# Patient Record
Sex: Female | Born: 1965 | Race: White | Hispanic: No | Marital: Married | Smoking: Never smoker
Health system: Southern US, Community
[De-identification: ages and names within clinical notes are randomized; demographics above are authoritative.]

## PROBLEM LIST (undated history)

## (undated) DIAGNOSIS — N907 Vulvar cyst: Secondary | ICD-10-CM

## (undated) DIAGNOSIS — D219 Benign neoplasm of connective and other soft tissue, unspecified: Secondary | ICD-10-CM

## (undated) DIAGNOSIS — M542 Cervicalgia: Secondary | ICD-10-CM

## (undated) DIAGNOSIS — Z87440 Personal history of urinary (tract) infections: Secondary | ICD-10-CM

## (undated) DIAGNOSIS — R519 Headache, unspecified: Secondary | ICD-10-CM

## (undated) DIAGNOSIS — N939 Abnormal uterine and vaginal bleeding, unspecified: Secondary | ICD-10-CM

## (undated) DIAGNOSIS — T4145XA Adverse effect of unspecified anesthetic, initial encounter: Secondary | ICD-10-CM

## (undated) DIAGNOSIS — E7800 Pure hypercholesterolemia, unspecified: Secondary | ICD-10-CM

## (undated) DIAGNOSIS — F419 Anxiety disorder, unspecified: Secondary | ICD-10-CM

## (undated) DIAGNOSIS — E8940 Asymptomatic postprocedural ovarian failure: Secondary | ICD-10-CM

## (undated) DIAGNOSIS — K219 Gastro-esophageal reflux disease without esophagitis: Secondary | ICD-10-CM

## (undated) DIAGNOSIS — G500 Trigeminal neuralgia: Secondary | ICD-10-CM

## (undated) DIAGNOSIS — R51 Headache: Secondary | ICD-10-CM

## (undated) DIAGNOSIS — IMO0002 Reserved for concepts with insufficient information to code with codable children: Secondary | ICD-10-CM

## (undated) DIAGNOSIS — M1990 Unspecified osteoarthritis, unspecified site: Secondary | ICD-10-CM

## (undated) DIAGNOSIS — T8859XA Other complications of anesthesia, initial encounter: Secondary | ICD-10-CM

## (undated) DIAGNOSIS — N816 Rectocele: Secondary | ICD-10-CM

## (undated) DIAGNOSIS — R202 Paresthesia of skin: Secondary | ICD-10-CM

## (undated) DIAGNOSIS — N814 Uterovaginal prolapse, unspecified: Secondary | ICD-10-CM

## (undated) DIAGNOSIS — K644 Residual hemorrhoidal skin tags: Secondary | ICD-10-CM

## (undated) DIAGNOSIS — I10 Essential (primary) hypertension: Secondary | ICD-10-CM

## (undated) DIAGNOSIS — R87619 Unspecified abnormal cytological findings in specimens from cervix uteri: Secondary | ICD-10-CM

## (undated) HISTORY — DX: Trigeminal neuralgia: G50.0

## (undated) HISTORY — DX: Asymptomatic postprocedural ovarian failure: E89.40

## (undated) HISTORY — PX: LAPAROSCOPY: SHX197

## (undated) HISTORY — PX: CHOLECYSTECTOMY: SHX55

## (undated) HISTORY — DX: Paresthesia of skin: R20.2

## (undated) HISTORY — DX: Vulvar cyst: N90.7

## (undated) HISTORY — DX: Benign neoplasm of connective and other soft tissue, unspecified: D21.9

## (undated) HISTORY — DX: Unspecified abnormal cytological findings in specimens from cervix uteri: R87.619

## (undated) HISTORY — DX: Rectocele: N81.6

## (undated) HISTORY — DX: Uterovaginal prolapse, unspecified: N81.4

## (undated) HISTORY — DX: Essential (primary) hypertension: I10

## (undated) HISTORY — DX: Headache: R51

## (undated) HISTORY — DX: Residual hemorrhoidal skin tags: K64.4

## (undated) HISTORY — DX: Headache, unspecified: R51.9

## (undated) HISTORY — DX: Reserved for concepts with insufficient information to code with codable children: IMO0002

## (undated) HISTORY — PX: BREAST ENHANCEMENT SURGERY: SHX7

## (undated) HISTORY — PX: VAGINAL HYSTERECTOMY: SUR661

## (undated) HISTORY — DX: Cervicalgia: M54.2

## (undated) HISTORY — DX: Abnormal uterine and vaginal bleeding, unspecified: N93.9

## (undated) HISTORY — DX: Pure hypercholesterolemia, unspecified: E78.00

## (undated) HISTORY — PX: OTHER SURGICAL HISTORY: SHX169

## (undated) HISTORY — DX: Personal history of urinary (tract) infections: Z87.440

---

## 2004-03-14 HISTORY — PX: AUGMENTATION MAMMAPLASTY: SUR837

## 2011-11-13 LAB — HM PAP SMEAR

## 2011-12-26 ENCOUNTER — Ambulatory Visit: Payer: Self-pay | Admitting: Internal Medicine

## 2012-11-12 LAB — HM MAMMOGRAPHY

## 2013-07-01 ENCOUNTER — Ambulatory Visit: Payer: Self-pay | Admitting: Neurology

## 2013-07-03 ENCOUNTER — Telehealth: Payer: Self-pay | Admitting: *Deleted

## 2013-07-03 NOTE — Telephone Encounter (Signed)
Pt returned call, she is seeking 2nd opinion on trigeminal neuralgia vs atypical facial pain.  Started 3 mon ago (mild to severe).  Been on gabapentin 100 po tid, then to generic tegretol, then changed to lamictal 200 mg po bid due to taking medication re: hysterectomy issues,  (taking with 3 advil too).  Dulls pain L side face.  Had MRI.  Has seen other neurologist.  Will get records and bring MRI CD with appt on 07-16-13 1300 with Dr. Leonie Man.  Referral made by client that is working with Dr. Collene Mares.  Will fax records to [phone] NP Coordinator.

## 2013-07-03 NOTE — Telephone Encounter (Signed)
Calling pt per Dr. Leonie Man as referral from Dr. Collene Mares.   LMVM for her to return call.  [phone]   07-16-13 1300  f/a

## 2013-07-11 NOTE — Telephone Encounter (Signed)
I spoke with pt and apologized re: I did not get her on schedule with Dr. Leonie Man although I placed in note.  Fortunately, Dr. Krista Blue had opening on same day at 0900.  Pt stated preferred woman MD, and this was ok.  She will be here.  CD and notes here from [facility].

## 2013-07-16 ENCOUNTER — Ambulatory Visit (INDEPENDENT_AMBULATORY_CARE_PROVIDER_SITE_OTHER): Payer: BC Managed Care – PPO | Admitting: Neurology

## 2013-07-16 ENCOUNTER — Encounter: Payer: Self-pay | Admitting: Neurology

## 2013-07-16 ENCOUNTER — Encounter (INDEPENDENT_AMBULATORY_CARE_PROVIDER_SITE_OTHER): Payer: Self-pay

## 2013-07-16 VITALS — BP 136/89 | HR 82 | Ht 64.0 in | Wt 176.0 lb

## 2013-07-16 DIAGNOSIS — R202 Paresthesia of skin: Secondary | ICD-10-CM | POA: Insufficient documentation

## 2013-07-16 DIAGNOSIS — M542 Cervicalgia: Secondary | ICD-10-CM

## 2013-07-16 DIAGNOSIS — R519 Headache, unspecified: Secondary | ICD-10-CM | POA: Insufficient documentation

## 2013-07-16 DIAGNOSIS — R51 Headache: Secondary | ICD-10-CM

## 2013-07-16 DIAGNOSIS — R209 Unspecified disturbances of skin sensation: Secondary | ICD-10-CM

## 2013-07-16 MED ORDER — GABAPENTIN 300 MG PO CAPS: ORAL_CAPSULE | ORAL | Status: DC

## 2013-07-16 NOTE — Progress Notes (Signed)
PATIENT: Paula Moses DOB: [birth year]  HISTORICAL  Amiliana Taher is a 48 years old right-handed Caucasian female, accompanied by her husband, referred by her primary care physician Dr. Carrie Mew in for evaluation of shooting pain on the left side of her face.  Pain started in Feb 2015, involving the left side of her teeth, top and botoom, radiating to her left cheek, chin, mild pain in her left  Ear, she was checked by dentist, was told that her teeth was fine fine,   She also noticed left neck pain, as if she has slept wrong,  She has tried chiropractor, massage, adjustment, did not help,   Since early April 2015, she had a few episode of severe shooting pain from her left ear, to her left cheek, is worse than the contraction pain she had for her child birth, lasting for 30 minutes, brought her to tears,  She has tried Excedrin, naproxen, Advil 2 times which only dulls the pain, but never took it away,  She was given prescription of gabapentin in June 21 2013, 100 mg 3 times a day, tramadol 100 mg as needed, along with Advil without taking away her pain, She was given prescription of Lamictal titrating dose to 273m bid since June 29 2013, which has finally helped her left facial pain into control, but almost nightly, she still has 7 out of 10 left facial pain, kept Her from sleep,  She also has a history of her large size fibroid, heavy bleeding, endometriosis, is considering for laparoscopic hysterectomy, carbamazepine had potential interaction with Lupron, was never tried.   [facility] 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  There was no rash broke out in her  face, she also complains of the discomfort in the roof of her mouth, also episode of numbness from waist down, there was also episode of shooting pain from her left neck, to her left shoulder, to her left arm, and left hand, she did have a history of chickenpox in the past,  She also complains of urinary urgency, to the point of incontinence, constipations,  Labs in April 14th 2015: CBC, H 12.7, CMP, TSH, ESR 47,   \ REVIEW OF SYSTEMS: Full 14 system review of systems performed and notable only for  Weight gain, fatigue, chest pressure, palpitation, eye pain, constipation, headache, numbness,  ALLERGIES: Allergies  Allergen Reactions  . Codeine   . Percocet [Oxycodone-Acetaminophen]   . Phenergan [Promethazine Hcl]     HOME MEDICATIONS: No current outpatient prescriptions on file prior to visit.   No current facility-administered medications on file prior to visit.    PAST MEDICAL HISTORY: Past Medical History  Diagnosis Date  . Hypertension   . High cholesterol   . Fibroids     PAST SURGICAL HISTORY: Past Surgical History  Procedure Laterality Date  . Breast enhancement surgery    . Tummy tuck     . Cholecystectomy    . Laparoscopy, endometriosis      FAMILY HISTORY: Family History  Problem Relation Age of Onset  . Brain cancer Mother   . Dementia Father     SOCIAL HISTORY:  History   Social History  .  Marital Status: Married    Spouse Name: N/A    Number of Children: 4  . Years of Education: BA   Occupational History  . realtor    Social History Main Topics  . Smoking status: Never Smoker   . Smokeless tobacco: Not on file  . Alcohol Use: Yes  . Drug Use: No  . Sexual Activity: Not on file   Other Topics Concern  . Not on file   Social History Narrative  . No narrative on file    PHYSICAL EXAM   Filed Vitals:   07/16/13 0919  BP: 136/89  Pulse: 82  Height: _0  (1.626 m)  Weight: 176 lb (79.833 kg)    Body mass index is 30.2  kg/(m^2).   Generalized: In no acute distress  Neck: Supple, no carotid bruits   Cardiac: Regular rate rhythm  Pulmonary: Clear to auscultation bilaterally  Musculoskeletal: No deformity  Neurological examination  Mentation: Alert oriented to time, place, history taking, and causual conversation  Cranial nerve II-XII: Pupils were equal round reactive to light. Extraocular movements were full.  Visual field were full on confrontational test. Bilateral fundi were sharp.  Facial sensation and strength were normal. Hearing was intact to finger rubbing bilaterally. Uvula tongue midline.  Head turning and shoulder shrug and were normal and symmetric.Tongue protrusion into cheek strength was normal.  Motor: Normal tone, bulk and strength.  Sensory: Intact to fine touch, pinprick, preserved vibratory sensation, and proprioception at toes.  Coordination: Normal finger to nose, heel-to-shin bilaterally there was no truncal ataxia  Gait: Rising up from seated position without assistance, normal stance, without trunk ataxia, moderate stride, good arm swing, smooth turning, able to perform tiptoe, and heel walking without difficulty.   Romberg signs: Negative  Deep tendon reflexes: Brachioradialis 2/2, biceps 2/2, triceps 2/2, patellar 3/3, Achilles 2/2, plantar responses were flexor bilaterally.   DIAGNOSTIC DATA (LABS, IMAGING, TESTING) - I reviewed patient records, labs, notes, testing and imaging myself where available.    ASSESSMENT AND PLAN  Brandee Markin is a 48 y.o. female with two-month history of gradual onset several worsening left facial shooting pain, in the distribution of left trigeminal nerve, mainly involving left V2, V3, she also complains of numbness from waist down, radiating pain from her left neck, to her left arm, shoulder, urinary urgency, mild constipations, brisker reflexes on examination  Localized the lesion to left brainstem, left cervical spine, thoracic  spine,  After discussed with patient and her husband, who desires further evaluation, we will proceed with MRI of cervical, thoracic, lumbar spine with and without contrast,  Laboratory evaluations,  CSF,    Marcial Pacas, M.D. Ph.D.  [facility] [address], [place], [place] [address] [phone]

## 2013-07-17 ENCOUNTER — Telehealth: Payer: Self-pay | Admitting: Neurology

## 2013-07-17 NOTE — Telephone Encounter (Signed)
Paula Moses: Please Caucasian, laboratory showed low ferritin level, otherwise normal laboratory evaluation, could indicate chronic mild anemia, low iron storage  Rest of the laboratory evaluation was normal,

## 2013-07-18 ENCOUNTER — Ambulatory Visit
Admission: RE | Admit: 2013-07-18 | Discharge: 2013-07-18 | Disposition: A | Discharge status: Home / Self Care | Payer: No Typology Code available for payment source | Source: Ambulatory Visit | Attending: Neurology | Admitting: Neurology

## 2013-07-18 VITALS — BP 132/78

## 2013-07-18 DIAGNOSIS — M542 Cervicalgia: Secondary | ICD-10-CM

## 2013-07-18 DIAGNOSIS — R51 Headache: Secondary | ICD-10-CM

## 2013-07-18 DIAGNOSIS — R519 Headache, unspecified: Secondary | ICD-10-CM

## 2013-07-18 DIAGNOSIS — R202 Paresthesia of skin: Secondary | ICD-10-CM

## 2013-07-18 NOTE — Discharge Instructions (Signed)

## 2013-07-18 NOTE — Progress Notes (Addendum)
Blood drawn from left AC to be sent with spinal fluid. Site is unremarkable and pt tolerated procedure well. Discharge instructions explained to pt and her husband.

## 2013-07-18 NOTE — Progress Notes (Signed)
Dr. Jobe Igo in to speak with pt.

## 2013-07-19 ENCOUNTER — Telehealth: Payer: Self-pay | Admitting: Neurology

## 2013-07-19 ENCOUNTER — Telehealth: Payer: Self-pay | Admitting: Radiology

## 2013-07-19 LAB — HERPES SIMPLEX VIRUS(HSV) DNA BY PCR
HSV 1 DNA: NOT DETECTED
HSV 2 DNA: NOT DETECTED

## 2013-07-19 NOTE — Telephone Encounter (Signed)
Pt called to ask how much activity was okay when she got up after her 24 hours of bedrest. Asked her to take it easy with no strenuous work or exercise.

## 2013-07-19 NOTE — Telephone Encounter (Signed)
Spoke to patient and relayed low ferritin level, which could indicate chronic mild anemia, per Dr. Krista Blue.

## 2013-07-19 NOTE — Telephone Encounter (Signed)
Spoke with Maudie Mercury from [employer] who relayed that patient's Lyme titer was <0.91 but should be <0.99.  They will issue a corrected report and fax it to the office.

## 2013-07-20 LAB — CSF PANEL 1
Glucose, CSF: 60 mg/dL (ref 43–76)
RBC Count, CSF: 0 cu mm
SYPHILIS VDRL QUANT CSF: NONREACTIVE
TUBE #: 4
Total Protein, CSF: 35 mg/dL (ref 15–45)
WBC, CSF: 0 cu mm (ref 0–5)

## 2013-07-22 ENCOUNTER — Telehealth: Payer: Self-pay | Admitting: Neurology

## 2013-07-22 ENCOUNTER — Other Ambulatory Visit: Payer: Self-pay

## 2013-07-22 NOTE — Telephone Encounter (Signed)
Patient calling to get results of spinal tap--please call--thank you.

## 2013-07-22 NOTE — Progress Notes (Signed)
Patient called to report positional headache exacerbated by coughing, sneezing, bending over, etc.  She is driving to [place] now for work and won't return until Wednesday (07/24/13) or Thursday (07/25/13).  She is consuming much caffeine/liquids.  She is unable to be flat due to her business trip but knows to call us when she returns if her positional headache continues or worsens.  She states she may call Dr. Krista Blue to find out results of LP and possibly to ask for Fioricet for headache, as she and I discussed.  jkl

## 2013-07-23 LAB — THYROID PANEL WITH TSH
Free Thyroxine Index: 1.7 (ref 1.2–4.9)
T3 UPTAKE RATIO: 25 % (ref 24–39)
T4, Total: 6.8 ug/dL (ref 4.5–12.0)
TSH: 3.68 u[IU]/mL (ref 0.450–4.500)

## 2013-07-23 LAB — SEDIMENTATION RATE: SED RATE: 9 mm/h (ref 0–32)

## 2013-07-23 LAB — IFE AND PE, SERUM
ALBUMIN SERPL ELPH-MCNC: 4.2 g/dL (ref 3.2–5.6)
ALPHA 1: 0.2 g/dL (ref 0.1–0.4)
Albumin/Glob SerPl: 1.2 (ref 0.7–2.0)
Alpha2 Glob SerPl Elph-Mcnc: 0.7 g/dL (ref 0.4–1.2)
B-Globulin SerPl Elph-Mcnc: 1.1 g/dL (ref 0.6–1.3)
Gamma Glob SerPl Elph-Mcnc: 1.6 g/dL (ref 0.5–1.6)
Globulin, Total: 3.6 g/dL (ref 2.0–4.5)
IGA/IMMUNOGLOBULIN A, SERUM: 445 mg/dL — AB (ref 91–414)
IGG (IMMUNOGLOBIN G), SERUM: 1810 mg/dL — AB (ref 700–1600)
IGM (IMMUNOGLOBULIN M), SRM: 179 mg/dL (ref 40–230)
Total Protein: 7.8 g/dL (ref 6.0–8.5)

## 2013-07-23 LAB — IRON AND TIBC
IRON SATURATION: 27 % (ref 15–55)
Iron: 104 ug/dL (ref 35–155)
TIBC: 383 ug/dL (ref 250–450)
UIBC: 279 ug/dL (ref 150–375)

## 2013-07-23 LAB — ANA W/REFLEX IF POSITIVE: Anti Nuclear Antibody(ANA): NEGATIVE

## 2013-07-23 LAB — C-REACTIVE PROTEIN: CRP: 2.7 mg/L (ref 0.0–4.9)

## 2013-07-23 LAB — LYME DISEASE, WESTERN BLOT
IGG P23 AB.: ABSENT
IGG P28 AB.: ABSENT
IGG P30 AB.: ABSENT
IGG P93 AB.: ABSENT
IgG P18 Ab.: ABSENT
IgG P39 Ab.: ABSENT
IgG P58 Ab.: ABSENT
IgG P66 Ab.: ABSENT
IgM P23 Ab.: ABSENT
IgM P39 Ab.: ABSENT
IgM P41 Ab.: ABSENT
Lyme IgG Wb: NEGATIVE
Lyme IgM Wb: NEGATIVE

## 2013-07-23 LAB — HEPATITIS PANEL, ACUTE
HEP A IGM: NEGATIVE
HEP B S AG: NEGATIVE
Hep B C IgM: NEGATIVE

## 2013-07-23 LAB — FERRITIN: Ferritin: 10 ng/mL — ABNORMAL LOW (ref 15–150)

## 2013-07-23 LAB — COPPER, SERUM: Copper: 102 ug/dL (ref 72–166)

## 2013-07-23 LAB — HIV ANTIBODY (ROUTINE TESTING W REFLEX)
HIV 1/O/2 Abs-Index Value: <1 (ref <1.00)
HIV-1/HIV-2 Ab: NONREACTIVE

## 2013-07-23 LAB — CK: Total CK: 77 U/L (ref 24–173)

## 2013-07-23 LAB — LYME, TOTAL AB TEST/REFLEX: Lyme IgG/IgM Ab: 0.99 {ISR} — ABNORMAL HIGH (ref 0.00–0.90)

## 2013-07-23 LAB — RPR: RPR: NONREACTIVE

## 2013-07-23 LAB — VITAMIN B12: Vitamin B-12: 1281 pg/mL — ABNORMAL HIGH (ref 211–946)

## 2013-07-23 NOTE — Telephone Encounter (Signed)
Please call patient, the blood tests,, and the CSF study showed no significant abnormalities, but there was few CSF study was still pending, we will have further discussion of the MRI, doing her return visit,

## 2013-07-23 NOTE — Telephone Encounter (Signed)
Pt is calling requesting LP results. Please advise

## 2013-07-24 LAB — MULTIPLE SCLEROSIS PANEL 2
ALBUMIN-INDEX: 5 (ref <9.0)
Albumin CSF: 17 mg/dL (ref <35)
Albumin: 3370 mg/dL — ABNORMAL LOW (ref 3700–5410)
IGA CSF: 0.38 mg/dL (ref 0.15–0.60)
IGG TOTAL: 1550 mg/dL (ref 694–1618)
IGM TOTAL: 140 mg/dL (ref 48–271)
IgA MSPROF: <0.001 mg/dL (ref <0.010)
IgA Total: 381 mg/dL (ref 81–463)
IgG Total CSF: 3 mg/dL (ref 0.5–6.1)
IgG-Index: 0.38 (ref <0.70)
IgG: <0.01 mg/dL (ref <0.10)
IgM MSPROF: <0.001 mg/dL (ref <0.010)
IgM-CSF: 0.04 mg/dL (ref <0.10)
Myelin basic protein, csf: <2 mcg/L (ref <4.1)

## 2013-07-24 NOTE — Telephone Encounter (Signed)
Called pt to inform her per Dr. Krista Blue that the blood test and the CSF study showed no significant abnormalities, but there was few CSF study that was still pending and that the doctor would discuss the MRI further at pt next OV on 08/07/13. I advised the pt that if she has any other problems, questions or concerns to call the office. Pt verbalized understanding.

## 2013-07-26 ENCOUNTER — Ambulatory Visit
Admission: RE | Admit: 2013-07-26 | Discharge: 2013-07-26 | Disposition: A | Discharge status: Home / Self Care | Payer: BC Managed Care – PPO | Source: Ambulatory Visit | Attending: Neurology | Admitting: Neurology

## 2013-07-26 ENCOUNTER — Other Ambulatory Visit: Payer: BC Managed Care – PPO

## 2013-07-26 ENCOUNTER — Ambulatory Visit
Admission: RE | Admit: 2013-07-26 | Discharge: 2013-07-26 | Disposition: A | Discharge status: Home / Self Care | Payer: No Typology Code available for payment source | Source: Ambulatory Visit | Attending: Neurology | Admitting: Neurology

## 2013-07-26 DIAGNOSIS — M542 Cervicalgia: Secondary | ICD-10-CM

## 2013-07-26 DIAGNOSIS — R202 Paresthesia of skin: Secondary | ICD-10-CM

## 2013-07-26 DIAGNOSIS — R209 Unspecified disturbances of skin sensation: Secondary | ICD-10-CM

## 2013-07-26 DIAGNOSIS — R519 Headache, unspecified: Secondary | ICD-10-CM

## 2013-07-26 DIAGNOSIS — R51 Headache: Secondary | ICD-10-CM

## 2013-07-26 MED ORDER — GADOBENATE DIMEGLUMINE 529 MG/ML IV SOLN
16.0000 mL | Freq: Once | INTRAVENOUS | Status: AC | PRN
  Administered 2013-07-26: 16 mL via INTRAVENOUS

## 2013-07-31 ENCOUNTER — Telehealth: Payer: Self-pay | Admitting: Neurology

## 2013-07-31 NOTE — Telephone Encounter (Signed)
Patient calling requesting MRI results.  Please return call.  Thanks

## 2013-08-01 NOTE — Telephone Encounter (Signed)
I have called her Abnormal MRI scan of the cervical spine showing mild spondylytic changes throughout most severe at C6-7 where there is a broad-based central disc herniation resulting in mild canal and foraminal narrowing but without severe compression.  MRI thoracic spine was normal.  CSF was essential normal, one ocb,  Her facial pain has much improved,   Follow up visit in May 27th 2015.

## 2013-08-01 NOTE — Telephone Encounter (Signed)
Spoke with patient and she is requesting the results of her MRI

## 2013-08-06 ENCOUNTER — Ambulatory Visit: Payer: Self-pay | Admitting: Obstetrics and Gynecology

## 2013-08-06 LAB — BASIC METABOLIC PANEL
Anion Gap: 4 — ABNORMAL LOW (ref 7–16)
BUN: 12 mg/dL (ref 7–18)
CHLORIDE: 102 mmol/L (ref 98–107)
Calcium, Total: 9 mg/dL (ref 8.5–10.1)
Co2: 29 mmol/L (ref 21–32)
Creatinine: 0.96 mg/dL (ref 0.60–1.30)
EGFR (African American): >60
EGFR (Non-African Amer.): >60
GLUCOSE: 129 mg/dL — AB (ref 65–99)
Osmolality: 272 (ref 275–301)
POTASSIUM: 3.7 mmol/L (ref 3.5–5.1)
Sodium: 135 mmol/L — ABNORMAL LOW (ref 136–145)

## 2013-08-06 LAB — CBC
HCT: 34.9 % — AB (ref 35.0–47.0)
HGB: 12.1 g/dL (ref 12.0–16.0)
MCH: 30.1 pg (ref 26.0–34.0)
MCHC: 34.6 g/dL (ref 32.0–36.0)
MCV: 87 fL (ref 80–100)
Platelet: 239 10*3/uL (ref 150–440)
RBC: 4.01 10*6/uL (ref 3.80–5.20)
RDW: 13.4 % (ref 11.5–14.5)
WBC: 6.9 10*3/uL (ref 3.6–11.0)

## 2013-08-07 ENCOUNTER — Ambulatory Visit (INDEPENDENT_AMBULATORY_CARE_PROVIDER_SITE_OTHER): Payer: BC Managed Care – PPO | Admitting: Neurology

## 2013-08-07 ENCOUNTER — Encounter: Payer: Self-pay | Admitting: Neurology

## 2013-08-07 VITALS — Ht 64.5 in | Wt 180.0 lb

## 2013-08-07 DIAGNOSIS — R519 Headache, unspecified: Secondary | ICD-10-CM | POA: Insufficient documentation

## 2013-08-07 DIAGNOSIS — I10 Essential (primary) hypertension: Secondary | ICD-10-CM

## 2013-08-07 DIAGNOSIS — D219 Benign neoplasm of connective and other soft tissue, unspecified: Secondary | ICD-10-CM | POA: Insufficient documentation

## 2013-08-07 DIAGNOSIS — G500 Trigeminal neuralgia: Secondary | ICD-10-CM | POA: Insufficient documentation

## 2013-08-07 DIAGNOSIS — R51 Headache: Secondary | ICD-10-CM

## 2013-08-07 DIAGNOSIS — D259 Leiomyoma of uterus, unspecified: Secondary | ICD-10-CM

## 2013-08-07 DIAGNOSIS — E7800 Pure hypercholesterolemia, unspecified: Secondary | ICD-10-CM

## 2013-08-07 MED ORDER — GABAPENTIN 100 MG PO CAPS: 300.0000 mg | ORAL_CAPSULE | Freq: Two times a day (BID) | ORAL | Status: DC

## 2013-08-07 MED ORDER — LAMOTRIGINE 100 MG PO TABS: 100.0000 mg | ORAL_TABLET | Freq: Two times a day (BID) | ORAL | Status: DC

## 2013-08-07 NOTE — Progress Notes (Signed)
PATIENT: Paula Moses DOB: Feb 10, 1966  HISTORICAL (initial visit May 5th 2015)   Jozelyn Beaston is a 48 years old right-handed Caucasian female, accompanied by her husband, referred by her primary care physician Dr. Carrie Mew in for evaluation of shooting pain on the left side of her face.  Pain started in Feb 2015, involving the left side of her teeth, top and botoom, radiating to her left cheek, chin, mild pain in her left ear, she was checked by dentist, was told that her teeth was fine fine,   She also noticed left neck pain, as if she has slept wrong,  She has tried chiropractor, massage, adjustment, did not help,   Since early April 2015, she had a few episode of severe shooting pain from her left ear, to her left cheek, is worse than the contraction pain she had for her child birth, lasting for 30 minutes, brought her to tears,  She has tried Excedrin, naproxen, Advil 2 times which only dulls the pain, but never took it away,  She was given prescription of gabapentin in June 21 2013, 100 mg 3 times a day, tramadol 100 mg as needed, along with Advil without taking away her pain, She was given prescription of Lamictal titrating dose to 272m bid since June 29 2013, which has finally helped her left facial pain into control, but almost nightly, she still has 7 out of 10 left facial pain, kept her from sleep,  She also has a history of heavy bleeding, endometriosis, is considering for laparoscopic hysterectomy, carbamazepine had potential interaction with Lupron, was never tried.   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  There was no rash broke out in her face, she also  complains of the discomfort in the roof of her mouth, also episode of numbness from waist down, there was also episode of shooting pain from her left neck, to her left shoulder, to her left arm, and left hand, she did have a history of chickenpox in the past,  She also complains of urinary urgency, to the point of incontinence, constipations,  Labs in April 14th 2015: CBC, H 12.7, CMP, TSH, ESR 47,   UPDATE May 27th 2015: She is now taking Gabapentin 3064mqhs, 20020mam, she noticed 14 LB weight gain over 6 weeks. Overall, the pain is managable, now she noticed right side cheek bone discomfort, intermittent, only twigs of left facial pain,  She has no shooting pain from left neck to her left arm, but she felt left arm weakness, rubbery sometimes.  She is scheduled to has laporoscopy evaluation for her endometriosis in June 1st 2015.  She also complains of headaches daily, bilateral occipital to vertex regions, she often woke up with a headaches, no light noise sensitive, no nauseous, movement made it worse, mild to moderate  We have reviewed laboratory, only abnormality is low ferritin 10, otherwise normal hepatitis panel, Lyme titer, RPR, B12B04olic acid, thyroid panel, CSF study was normal, there is one oligoclonal banding  MRI of thoracic was normal, MRI cervical showed mild degenerative disc disease, most severe at C6 and 7, with a central herniated disc, no significant foraminal, or canal stenosis  REVIEW  OF SYSTEMS: Full 14 system review of systems performed and notable only for  Weight gain, fatigue, chest pressure, palpitation, eye pain, constipation, headache, numbness,  ALLERGIES: Allergies  Allergen Reactions  . Codeine   . Percocet [Oxycodone-Acetaminophen]   . Phenergan [Promethazine Hcl]     HOME MEDICATIONS: Current Outpatient Prescriptions on File Prior to Visit  Medication Sig Dispense Refill  . gabapentin (NEURONTIN) 300 MG capsule 900 mg 3times a day  270 capsule  12   . hydrochlorothiazide (HYDRODIURIL) 25 MG tablet Take 25 mg by mouth daily.      Marland Kitchen lamoTRIgine (LAMICTAL) 100 MG tablet Take 200 mg by mouth 2 (two) times daily. 200 mg twice daily       No current facility-administered medications on file prior to visit.    PAST MEDICAL HISTORY: Past Medical History  Diagnosis Date  . Hypertension   . High cholesterol   . Fibroids   . Neck pain   . Trigeminal neuralgia   . Left-sided face pain   . Paresthesia     PAST SURGICAL HISTORY: Past Surgical History  Procedure Laterality Date  . Breast enhancement surgery    . Tummy tuck     . Cholecystectomy    . Laparoscopy, endometriosis      FAMILY HISTORY: Family History  Problem Relation Age of Onset  . Brain cancer Mother   . Dementia Father     SOCIAL HISTORY:  History   Social History  . Marital Status: Married    Spouse Name: N/A    Number of Children: 4  . Years of Education: BA   Occupational History  . realtor    Social History Main Topics  . Smoking status: Never Smoker   . Smokeless tobacco: Never Used  . Alcohol Use: Yes     Comment: OCC.  . Drug Use: No  . Sexual Activity: Not on file   Other Topics Concern  . Not on file   Social History Narrative   Patient lives at home at home with her Husband [name]) Patient works full time Cabin crew full time .   Education college.   Right handed.   Caffeine one cup of coffee daily.    PHYSICAL EXAM   Filed Vitals:   08/07/13 1100  Height: 5' 4.5" (1.638 m)  Weight: 180 lb (81.647 kg)    Body mass index is 30.43 kg/(m^2).   Generalized: In no acute distress  Neck: Supple, no carotid bruits   Cardiac: Regular rate rhythm  Pulmonary: Clear to auscultation bilaterally  Musculoskeletal: No deformity  Neurological examination  Mentation: Alert oriented to time, place, history taking, and causual conversation  Cranial nerve II-XII: Pupils were equal round reactive to light. Extraocular movements were  full.  Visual field were full on confrontational test. Bilateral fundi were sharp.  Facial sensation and strength were normal. Hearing was intact to finger rubbing bilaterally. Uvula tongue midline.  Head turning and shoulder shrug and were normal and symmetric.Tongue protrusion into cheek strength was normal.  Motor: Normal tone, bulk and strength.  Sensory: Intact to fine touch, pinprick, preserved vibratory sensation, and proprioception at toes.  Coordination: Normal finger to nose, heel-to-shin bilaterally there was no truncal ataxia  Gait: Rising up from seated position without assistance, normal stance, without trunk ataxia, moderate stride, good arm swing, smooth turning, able to perform tiptoe, and heel walking without difficulty.   Romberg signs: Negative  Deep tendon reflexes: Brachioradialis 2/2, biceps 2/2, triceps 2/2, patellar 3/3, Achilles 2/2,  plantar responses were flexor bilaterally.   DIAGNOSTIC DATA (LABS, IMAGING, TESTING) - I reviewed patient records, labs, notes, testing and imaging myself where available.    ASSESSMENT AND PLAN  Patricie Geeslin is a 48 y.o. female with two-month history of gradual onset several worsening left facial shooting pain, in the distribution of left trigeminal nerve, involving left V2, V3 branches, also has right facial symptoms, and other constellation of symptoms, involving upper, lower extremities, extensive evaluation detailed above there is evidence of C6 and 7 degenerative disc disease, no canal, or foraminal stenosis,  Overall her symptoms has much improved, will tapering down her lamotrigine to 100 mg twice a day, continue tapering down her gabapentin to 19m, 1-3 tablets twice a day Return to clinic in 4 months     YMarcial Pacas M.D. Ph.D.  [facility] [address] [place] [phone]

## 2013-08-12 ENCOUNTER — Ambulatory Visit: Payer: Self-pay | Admitting: Obstetrics and Gynecology

## 2013-08-14 LAB — PATHOLOGY REPORT

## 2013-08-19 LAB — FUNGUS CULTURE W SMEAR: Smear Result: NONE SEEN

## 2013-11-19 ENCOUNTER — Ambulatory Visit: Payer: Self-pay | Admitting: Obstetrics and Gynecology

## 2013-11-19 LAB — BASIC METABOLIC PANEL
ANION GAP: 7 (ref 7–16)
BUN: 13 mg/dL (ref 7–18)
CALCIUM: 9 mg/dL (ref 8.5–10.1)
CHLORIDE: 100 mmol/L (ref 98–107)
CO2: 29 mmol/L (ref 21–32)
Creatinine: 1.01 mg/dL (ref 0.60–1.30)
EGFR (Non-African Amer.): >60
Glucose: 93 mg/dL (ref 65–99)
OSMOLALITY: 272 (ref 275–301)
POTASSIUM: 3.7 mmol/L (ref 3.5–5.1)
Sodium: 136 mmol/L (ref 136–145)

## 2013-11-19 LAB — CBC
HCT: 34.6 % — AB (ref 35.0–47.0)
HGB: 11.4 g/dL — ABNORMAL LOW (ref 12.0–16.0)
MCH: 28.3 pg (ref 26.0–34.0)
MCHC: 33.1 g/dL (ref 32.0–36.0)
MCV: 86 fL (ref 80–100)
Platelet: 247 10*3/uL (ref 150–440)
RBC: 4.04 10*6/uL (ref 3.80–5.20)
RDW: 14.9 % — AB (ref 11.5–14.5)
WBC: 6.8 10*3/uL (ref 3.6–11.0)

## 2013-11-25 ENCOUNTER — Ambulatory Visit: Payer: Self-pay | Admitting: Obstetrics and Gynecology

## 2013-11-26 LAB — PATHOLOGY REPORT

## 2013-11-26 LAB — HEMOGLOBIN: HGB: 8.6 g/dL — ABNORMAL LOW (ref 12.0–16.0)

## 2013-12-09 ENCOUNTER — Ambulatory Visit: Payer: BC Managed Care – PPO | Admitting: Neurology

## 2013-12-25 ENCOUNTER — Ambulatory Visit: Payer: Self-pay | Admitting: Physician Assistant

## 2014-02-03 ENCOUNTER — Other Ambulatory Visit: Payer: Self-pay | Admitting: Neurology

## 2014-03-18 ENCOUNTER — Ambulatory Visit: Payer: No Typology Code available for payment source | Admitting: Internal Medicine

## 2014-07-05 NOTE — Op Note (Signed)
PATIENT NAME:  Paula Moses, Paula Moses MR#:  [id number] DATE OF BIRTH:  1965/08/16  DATE OF PROCEDURE:  08/12/2013  PREOPERATIVE DIAGNOSES: 1.  Chronic pelvic pain (severe dysmenorrhea, deep thrusting dyspareunia).  2.  Abnormal uterine bleeding/menorrhagia.   POSTOPERATIVE DIAGNOSIS: 1.  Chronic pelvic pain (severe dysmenorrhea, deep thrusting dyspareunia).  2.  Abnormal uterine bleeding/menorrhagia.  3.  Suspect endometriosis.  4.  Rectocele.   OPERATIVE PROCEDURES: 1.  Laparoscopy with peritoneal biopsies.  2.  Hysteroscopy/D and C.   SURGEON:  Heinz Eckert A. Brendy Ficek, M.D.   FIRST ASSISTANT: Mechele Claude, PA-S.   ANESTHESIA:  General endotracheal.   INDICATIONS:  The patient is a 49 year old married white female, para 4-0-1-4, with a long history of severe dysmenorrhea, chronic pelvic pain, deep thrusting dyspareunia and abnormal uterine bleeding, which has been refractory to conservative medical therapies.   FINDINGS AT SURGERY:  Revealed gynecoid pelvis; there was uterine descensus to within 2 cm of the introitus. Moderate rectocele is present.   Laparoscopy demonstrated a paratubal cyst x 2 on the right; grossly normal ovaries; normal uterus and fallopian tubes; peritoneal defects including pseudofenestrations were seen in the cul-de-sac, left ovarian fossa, as well as a scarring/white lesion in the left ovarian fossa. Prominent uterosacral ligaments were also identified. The upper abdomen looked normal. Appendix appeared normal.   DESCRIPTION OF PROCEDURE:  The patient was brought to the operating room where she was placed in the supine position. General endotracheal anesthesia was induced without difficulty. She was placed in the dorsal lithotomy position using the bumblebee stirrups. A ChloraPrep and Betadine abdominal, perineal, intravaginal prep and drape was performed in standard fashion. Red Robinson catheter was used to drain 200 mL of urine from the bladder. The laparoscopy was then  performed in standard fashion. Subumbilical vertical incision 5 mm in length was made. The Optiview laparoscopic trocar system was used to make direct entry into the abdominopelvic cavity without evidence of bowel or vascular injury. Pneumoperitoneum was created with CO2 gas. A second 5 mm port was placed in the lower abdomen just to the right of midline under direct visualization. The above-noted findings were photo documented. Biopsies of the cul-de-sac, left ovarian fossa and left uterosacral ligament were taken. These were sent to pathology. After completing survey of the abdominopelvic cavity, the laparoscopic portion of the procedure was terminated with the instrumentation being removed from the abdominopelvic cavity.  The pneumoperitoneum was released. The incisions were closed with 4-0 plain suture and Dermabond glue.   Next, attention was turned to the hysteroscopy D and C. The patient was placed in the high lithotomy position. A weighted speculum was placed into the vagina and the Hulka tenaculum was removed from the cervix. A single-tooth tenaculum was then replaced onto the anterior lip of the cervix. The uterus sounded to 8 cm. Hanks dilators up to a #20 [place] caliber were used to dilate the endocervical canal. The [facility] hysteroscope using lactated Ringer's as irrigant was used to assess the intrauterine cavity. Photo documentation was made of the cavity with no evidence of pathologic lesions being seen. The uterine ostia were seen for each fallopian tube in the cornual regions. Curettage was then performed with both smooth and serrated curettes. Following completion of the curettage, repeat hysteroscopy was performed with adequate sampling being demonstrated. All instrumentation was then removed from the vagina. The patient was then awakened, mobilized and taken to the recovery room in satisfactory condition.  The estimated blood loss was minimal.  Complications were none.  All instruments,  needle  and sponge counts were verified as correct. Intravenous fluids were 1100 mL of crystalloid. Urine output was 200 mL.     ____________________________ Alanda Slim. Francisco Eyerly, MD mad:dmm D: 08/12/2013 10:33:45 ET T: 08/12/2013 11:06:25 ET JOB#: [id number]  cc: Hassell Done A. Jerauld Bostwick, MD, <Dictator> [facility] MD ELECTRONICALLY SIGNED 08/12/2013 15:01

## 2014-07-05 NOTE — Op Note (Signed)
PATIENT NAME:  Paula Moses, Paula Moses MR#:  [id number] DATE OF BIRTH:  1965/10/29  DATE OF PROCEDURE:  11/25/2013  PREOPERATIVE DIAGNOSES:  1.  Chronic pelvic pain.  2.  Symptomatic rectocele.   POSTOPERATIVE DIAGNOSES: 1.  Chronic pelvic pain.  2.  Symptomatic rectocele.   OPERATIVE PROCEDURES:  1.  Transvaginal hysterectomy, bilateral salpingo-oophorectomy.  2.  Posterior colporrhaphy.   SURGEON: Alanda Slim. Nekeisha Aure, MD  FIRST ASSISTANT: Anika S. Marcelline Mates, MD  ANESTHESIA: General.   INDICATIONS: The patient is a 49 year old white female with a long history of abnormal uterine bleeding refractory to conservative medical therapy, history of chronic pelvic pain including dyspareunia which has been refractory to conservative medical management and with a large rectocele, who presents now for definitive surgery.   FINDINGS AT SURGERY: Revealed grossly normal-appearing uterus, tubes and ovaries. There was a large rectocele which was extremely vascular.   DESCRIPTION OF PROCEDURE: The patient was brought to the operating room where she was placed in the supine position. General endotracheal anesthesia was induced without difficulty. She was placed in the dorsal lithotomy position using the candycane stirrups. A Betadine perineal, intravaginal prep and drape was performed in standard fashion. Foley catheter was placed to drain clear yellow urine from the bladder. A weighted speculum was placed into the vagina and a double-tooth tenaculum was placed on the cervix. Posterior colpotomy was made with Mayo scissors. Uterosacral ligaments were clamped, cut and stick tied using 0 Vicryl suture. These were tagged. The cervix was circumscribed with the Bovie cautery. The vagina and bladder were dissected off the lower uterine segment through sharp and blunt dissection. Eventually, the anterior cul-de-sac was entered. Sequentially the cardinal broad ligament complexes were then clamped, cut and stick tied using 0  Vicryl suture. This was carried up to the level of the utero-ovarian ligaments which were then crossclamped and cut and the specimen was removed from the operative field. The adnexa were then excised in standard fashion. The infundibulopelvic ligaments were isolated and crossclamped with curved Heaney clamps. The tubes and ovaries were then excised sharply. A free tie was placed followed by a stick tie on each infundibulopelvic ligament. Good hemostasis was noted. The posterior cuff was run using 0 chromic suture in a running baseball stitch manner. The cul-de-sac was closed using a peritoneal stitch of 0 Vicryl in a pursestring manner. The anterior vagina was closed using simple interrupted sutures of 2-0 chromic.   Next, posterior colporrhaphy was performed. Allis clamps were used to grasp the angles of the vagina at the introitus. A diamond-shaped wedge of tissue was removed from the introitus. The vaginal mucosa was undermined using Metzenbaum scissors in the midline. This undermining was followed by incising. Geraldine Contras retractors were used to facilitate exposure. Sequentially, the vagina was dissected off the perirectal fascia through sharp and blunt dissection. This isolated the hernia. Simple interrupted sutures of 0 Vicryl were then used in a mattress technique to reduce the rectocele. A total of 5 sutures were placed. The vaginal mucosa was then trimmed. The vagina was then reapproximated in the midline using 2-0 chromic suture in a simple interrupted manner. Following completion of the repair, the vagina was packed with Kerlix gauze and Premarin cream. The patient was then awakened, extubated and taken to the recovery room in satisfactory condition.   ESTIMATED BLOOD LOSS: 450 mL.   INTRAVENOUS FLUIDS: 1500 mL.   URINE OUTPUT: 100 mL.   COUNTS: All instrument, needle and sponge counts were verified as correct.   ANTIBIOTICS  GIVEN: The patient did receive Ancef antibiotic  prophylaxis.   ____________________________ Alanda Slim Angellina Ferdinand, MD mad:TT D: 11/25/2013 12:57:19 ET T: 11/25/2013 15:25:41 ET JOB#: [id number]  cc: Hassell Done A. Dailee Manalang, MD, <Dictator> [facility] Alanda Slim Kemyah Buser MD ELECTRONICALLY SIGNED 12/02/2013 7:56

## 2014-09-09 ENCOUNTER — Encounter: Payer: Self-pay | Admitting: Obstetrics and Gynecology

## 2014-09-17 ENCOUNTER — Other Ambulatory Visit: Payer: Self-pay | Admitting: Physician Assistant

## 2014-09-17 DIAGNOSIS — R1032 Left lower quadrant pain: Secondary | ICD-10-CM

## 2014-09-19 ENCOUNTER — Ambulatory Visit
Admission: RE | Admit: 2014-09-19 | Discharge: 2014-09-19 | Disposition: A | Discharge status: Home / Self Care | Payer: No Typology Code available for payment source | Source: Ambulatory Visit | Attending: Physician Assistant | Admitting: Physician Assistant

## 2014-09-19 DIAGNOSIS — K5790 Diverticulosis of intestine, part unspecified, without perforation or abscess without bleeding: Secondary | ICD-10-CM | POA: Diagnosis not present

## 2014-09-19 DIAGNOSIS — R1032 Left lower quadrant pain: Secondary | ICD-10-CM

## 2014-09-19 MED ORDER — IOHEXOL 300 MG/ML  SOLN
100.0000 mL | Freq: Once | INTRAMUSCULAR | Status: AC | PRN
  Administered 2014-09-19: 100 mL via INTRAVENOUS

## 2014-09-22 ENCOUNTER — Ambulatory Visit: Payer: No Typology Code available for payment source

## 2015-02-27 ENCOUNTER — Other Ambulatory Visit: Payer: Self-pay | Admitting: Obstetrics and Gynecology

## 2015-03-31 ENCOUNTER — Ambulatory Visit (INDEPENDENT_AMBULATORY_CARE_PROVIDER_SITE_OTHER): Payer: BLUE CROSS/BLUE SHIELD | Admitting: Podiatry

## 2015-03-31 ENCOUNTER — Ambulatory Visit: Payer: BLUE CROSS/BLUE SHIELD

## 2015-03-31 ENCOUNTER — Ambulatory Visit (INDEPENDENT_AMBULATORY_CARE_PROVIDER_SITE_OTHER): Payer: BLUE CROSS/BLUE SHIELD

## 2015-03-31 ENCOUNTER — Encounter: Payer: Self-pay | Admitting: Podiatry

## 2015-03-31 VITALS — BP 147/89 | HR 67 | Resp 18

## 2015-03-31 DIAGNOSIS — R52 Pain, unspecified: Secondary | ICD-10-CM

## 2015-03-31 DIAGNOSIS — S92001A Unspecified fracture of right calcaneus, initial encounter for closed fracture: Secondary | ICD-10-CM | POA: Diagnosis not present

## 2015-03-31 DIAGNOSIS — T148 Other injury of unspecified body region: Secondary | ICD-10-CM | POA: Diagnosis not present

## 2015-03-31 DIAGNOSIS — T148XXA Other injury of unspecified body region, initial encounter: Secondary | ICD-10-CM

## 2015-03-31 NOTE — Progress Notes (Signed)
Subjective:     Patient ID: Paula Moses, female   DOB: [birth year], 50 y.o.   MRN: [id number]  HPI Patient presents today after stepping off of fireplace when foot half on the fireplace and half not, heard pop, feel to ground and had a lot of pain. She got up and walked around on it, drove to the store, but after dinner the right ankle hurt and could not walk up stairs. Most of the pain on the top of the ankle when it "popped" but has pain to her heel and under her "bone under the ankle" The swelling has increased, but has been icing which has helped. There is small numbness to the toes on the right, mostly the 2nd and 3rd. No other injuries. No other complaints at this time.  Review of Systems  All other systems reviewed and are negative.      Objective:   Physical Exam General: AAO x3, NAD  Dermatological: Skin is warm, dry and supple bilateral. Nails x 10 are well manicured; remaining integument appears unremarkable at this time. There are no open sores, no preulcerative lesions, no rash or signs of infection present.  Vascular: Dorsalis Pedis artery and Posterior Tibial artery pedal pulses are 2/4 bilateral with immedate capillary fill time. Pedal hair growth present. No varicosities and no lower extremity edema present bilateral. There is no pain with calf compression, swelling, warmth, erythema.   Neruologic: Grossly intact via light touch bilateral. Vibratory intact via tuning fork bilateral. Protective threshold with Semmes Wienstein monofilament intact to all pedal sites bilateral. Patellar and Achilles deep tendon reflexes 2+ bilateral. No Babinski or clonus noted bilateral.   Musculoskeletal: There is tenderness palpation on the lateral aspect of the calcaneus with tenderness vibration overlying this area. Achilles tendon appears to be intact without any defect and Grandville Silos has was negative. There is discomfort on the anterior aspect of the ankle on the lateral portion along the  extensor tendon however that she does appear to be intact. MMT intact in both dorsiflexion as well as plantar flexion. There is mild discomfort and inversion eversion. No palmar course the peroneal tendons. No other areas of tenderness to bilateral lower extremity's.  Gait: Unassisted, Nonantalgic.      Assessment:     50 year old female with possible calcaneal fracture; cannot rule out tendon tear    Plan:     -X-rays were obtained and reviewed with the patient. On the calcaneal axial view there does appear to be a sclerotic as well as a radiolucent line consistent with a fracture. -Treatment options discussed including all alternatives, risks, and complications -Recommended MRI to further evaluate the tendons. Given her history of hearing a pop and the pain to the front of her ankle cannot rule out tear although it appears that the tendons are intact this time -Immobilization and cam boot. -Ice and elevation -Follow-up after MRI or sooner if any problems arise. In the meantime, encouraged to call the office with any questions, concerns, change in symptoms.   Celesta Gentile, DPM
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
## 2015-04-02 ENCOUNTER — Telehealth: Payer: Self-pay | Admitting: *Deleted

## 2015-04-02 DIAGNOSIS — S92001A Unspecified fracture of right calcaneus, initial encounter for closed fracture: Secondary | ICD-10-CM

## 2015-04-02 DIAGNOSIS — T148XXA Other injury of unspecified body region, initial encounter: Secondary | ICD-10-CM

## 2015-04-02 NOTE — Telephone Encounter (Deleted)
-----   Message from Trula Slade, DPM sent at 04/02/2015  8:16 AM EST ----- Can you please order an MRI of the right ankle and foot to rule out tendon tear/rupture and calcaneal fracture? Thanks.

## 2015-04-03 ENCOUNTER — Other Ambulatory Visit: Payer: Self-pay | Admitting: Podiatry

## 2015-04-03 DIAGNOSIS — T148XXA Other injury of unspecified body region, initial encounter: Secondary | ICD-10-CM

## 2015-04-03 DIAGNOSIS — S92001A Unspecified fracture of right calcaneus, initial encounter for closed fracture: Secondary | ICD-10-CM

## 2015-04-03 NOTE — Telephone Encounter (Addendum)
-----   Message from Trula Slade, DPM sent at 04/02/2015  9:35 AM EST ----- It is for extensor tendon tear. It is right at the ankle and below. I guess if they only approve one, do the ankle, but include the heel.  ----- Message -----    From: Andres Ege, RN    Sent: 04/02/2015   9:20 AM      To: Trula Slade, DPM  Dr. Jacqualyn Posey, is that just right ankle to get achilles and the hindfoot for plantar fascial tear(s)? Or do you need foot and ankle both, sorry to be confusing, but sometimes they'll approve only one and then you have to wait for the other to be approved if ever.  Marcy Siren ----- Message -----    From: Trula Slade, DPM    Sent: 04/02/2015   8:16 AM      To: Andres Ege, RN  Can you please order an MRI of the right ankle and foot to rule out tendon tear/rupture and calcaneal fracture? Thanks. Ordered right ankle MRI r/o extensor tendon tear and fx calcaneal faxed to [facility]. [id number] - [employer]BS pre-cert started for MRI Right ankle 73721, dx S92.001A and T14.8,  PRIOR AUTHORIZATION [id number], VALID 04/06/2015 TO 05/05/2015.  FAXED TO [facility].  04/13/2015 - PT LEFT message with DOB, phone number only.  I left message to call again.  04/13/2015 - PT LEFT message asking if she needed to be seen this am, since her MRI is at a later date, and does she need to wear her boot.  I LEFT MESSAGE apologizing for calling so late, but I also felt it best that she be seen by Dr. Jacqualyn Posey today, he may have other therapies or comfort measures for her to use prior to the MRI.  I encouraged pt to call with concerns.  04/15/2015 - PT LEFT MESSAGE with name and work phone number only. I left message to call again.  04/22/2015-PT STATES her MRI was rescheduled until 04/30/2015 and she wanted to know if she needed a follow up appt before then and also the new MRI date is now 4 weeks after her injury does she even need it.  I left message encouraging pt to make an appt to discuss with Dr. Jacqualyn Posey or  make an appt if having problems.

## 2015-04-14 ENCOUNTER — Ambulatory Visit: Payer: BC Managed Care – PPO | Admitting: Podiatry

## 2015-04-30 ENCOUNTER — Ambulatory Visit: Payer: BLUE CROSS/BLUE SHIELD

## 2015-05-06 ENCOUNTER — Telehealth: Payer: Self-pay | Admitting: *Deleted

## 2015-05-06 NOTE — Telephone Encounter (Addendum)
Paula Moses states pt had MRI scheduled for the 04/30/2015, but called and rescheduled to 05/09/2015, and the authorization expires prior to that appt.  I informed pt I would have to get a new prior authorization and could not guarantee it would be before 05/09/2015.  05/06/2015-RE-PRE-CERTIFIED PT'S MRI OF RIGHT ANKLE WITH [employer]BS, AUTHORIZATION# [id number], VALID 05/06/2015 TO 06/04/2015.  Notification of Prior authorization called to pt with expiration date and left message for Paula Moses at [facility] scheduling [phone] with new authorization number.  05/08/2015-PT STATES WILL COST HER ABOUR $1100.00 DOES SHE REALLY NEED THE MRI AFTER 8 WEEKS FROM INJURY.  05/11/2015-INFORMED PT DR. Jacqualyn Moses had reviewed the MRI of 05/09/2015 and wanted her to make an appt to discuss.  Pt agreed and transferred to schedulers.

## 2015-05-09 ENCOUNTER — Ambulatory Visit (HOSPITAL_BASED_OUTPATIENT_CLINIC_OR_DEPARTMENT_OTHER)
Admission: RE | Admit: 2015-05-09 | Discharge: 2015-05-09 | Disposition: A | Discharge status: Home / Self Care | Payer: BLUE CROSS/BLUE SHIELD | Source: Ambulatory Visit | Attending: Podiatry | Admitting: Podiatry

## 2015-05-09 DIAGNOSIS — M25474 Effusion, right foot: Secondary | ICD-10-CM | POA: Diagnosis not present

## 2015-05-09 DIAGNOSIS — M659 Synovitis and tenosynovitis, unspecified: Secondary | ICD-10-CM | POA: Diagnosis not present

## 2015-05-09 DIAGNOSIS — S92001A Unspecified fracture of right calcaneus, initial encounter for closed fracture: Secondary | ICD-10-CM

## 2015-05-09 DIAGNOSIS — T148XXA Other injury of unspecified body region, initial encounter: Secondary | ICD-10-CM

## 2015-05-09 DIAGNOSIS — T148 Other injury of unspecified body region: Secondary | ICD-10-CM | POA: Diagnosis present

## 2015-05-11 NOTE — Telephone Encounter (Signed)
Have her come in and we will evaluate. Thanks.

## 2015-05-12 ENCOUNTER — Encounter: Payer: Self-pay | Admitting: Podiatry

## 2015-05-12 ENCOUNTER — Other Ambulatory Visit: Payer: Self-pay

## 2015-05-12 ENCOUNTER — Ambulatory Visit (INDEPENDENT_AMBULATORY_CARE_PROVIDER_SITE_OTHER): Payer: BLUE CROSS/BLUE SHIELD | Admitting: Podiatry

## 2015-05-12 VITALS — BP 157/112 | HR 92 | Resp 18

## 2015-05-12 DIAGNOSIS — M2540 Effusion, unspecified joint: Secondary | ICD-10-CM | POA: Diagnosis not present

## 2015-05-12 DIAGNOSIS — M779 Enthesopathy, unspecified: Secondary | ICD-10-CM

## 2015-05-12 MED ORDER — MELOXICAM 15 MG PO TABS: 15.0000 mg | ORAL_TABLET | Freq: Every day | ORAL | Status: DC

## 2015-05-13 NOTE — Progress Notes (Signed)
Patient ID: Paula Moses, female   DOB: [birth year], 51 y.o.   MRN: [id number]  Subjective: 50 year old female presents the office today for follow-up evaluation of right ankle and foot pain which has been ongoing for the last several weeks. She states that she was wearing the cam boot for the first couple weeks that she discontinued it. Due to multiple issues she just cut the MRI done over the weekend for which she presents a discussed the results. She's presents today wearing a high-heeled shoe. She states that she still Needs her pain is over foot and her ankle however it is somewhat improved compared to prior appointment. She states that she has not been icing anymore as his been no swelling. No other complaints at this time in no acute changes otherwise.  Objective: General: AAO x3, NAD; presents wearing a high heeled shoe.   Dermatological: Skin is warm, dry and supple bilateral. Nails x 10 are well manicured; remaining integument appears unremarkable at this time. There are no open sores, no preulcerative lesions, no rash or signs of infection present.  Vascular: Dorsalis Pedis artery and Posterior Tibial artery pedal pulses are 2/4 bilateral with immedate capillary fill time. Pedal hair growth present. No varicosities and no lower extremity edema present bilateral. There is no pain with calf compression, swelling, warmth, erythema.   Neruologic: Grossly intact via light touch bilateral. Vibratory intact via tuning fork bilateral. Protective threshold with Semmes Wienstein monofilament intact to all pedal sites bilateral. Patellar and Achilles deep tendon reflexes 2+ bilateral. No Babinski or clonus noted bilateral.   Musculoskeletal: There is continued tenderness along the course extensor tendons on the anterior aspect of the ankle. She is able to dorsiflex and plantar flex the toes without any problems in the 2 not appear to be torn. There is no pain today with lateral compression of the  calcaneus. No pain on the Achilles tendon. There is discomfort along the lateral aspect of the foot on the sinus tarsi however there is no pain with range of motion. There is is, course the posterior tibial tendon posterior and inferior to the medial malleolus. No pain with ankle joint range of motion. There is no area pinpoint bony tenderness or pain the vibratory sensation.  Gait: Unassisted, Nonantalgic.      Assessment:     50 year old female extensive tendinitis; effusion.     Plan:     -Treatment options discussed including all alternatives, risks, and complications -Results were discussed the patient. -Given her continued pain given the results the MRI do recommend immobilization. She states she cannot wear the CAM boot. Dispensed ankle brace. Continue with icing daily. -Prescribed mobic. Discussed side effects of the medication and directed to stop if any are to occur and call the office.  -I would like physical therapy however I like for her to hold off on this until her pain subsides. -Discussed shoe gear modifications. -Follow-up in 3-4 weeks or sooner if any problems arise. In the meantime, encouraged to call the office with any questions, concerns, change in symptoms.   Celesta Gentile, DPM

## 2015-06-09 ENCOUNTER — Ambulatory Visit: Payer: BC Managed Care – PPO | Admitting: Podiatry

## 2015-06-16 ENCOUNTER — Ambulatory Visit: Payer: BLUE CROSS/BLUE SHIELD | Admitting: Podiatry

## 2016-11-22 ENCOUNTER — Other Ambulatory Visit: Payer: Self-pay | Admitting: Physician Assistant

## 2016-11-22 DIAGNOSIS — Z1231 Encounter for screening mammogram for malignant neoplasm of breast: Secondary | ICD-10-CM

## 2016-12-01 ENCOUNTER — Other Ambulatory Visit: Payer: Self-pay | Admitting: Physician Assistant

## 2016-12-01 ENCOUNTER — Ambulatory Visit
Admission: RE | Admit: 2016-12-01 | Discharge: 2016-12-01 | Disposition: A | Discharge status: Home / Self Care | Payer: BLUE CROSS/BLUE SHIELD | Source: Ambulatory Visit | Attending: Physician Assistant | Admitting: Physician Assistant

## 2016-12-01 DIAGNOSIS — Z9882 Breast implant status: Secondary | ICD-10-CM | POA: Diagnosis not present

## 2016-12-01 DIAGNOSIS — Z1231 Encounter for screening mammogram for malignant neoplasm of breast: Secondary | ICD-10-CM | POA: Diagnosis not present

## 2016-12-05 ENCOUNTER — Inpatient Hospital Stay
Admission: RE | Admit: 2016-12-05 | Discharge: 2016-12-05 | Disposition: A | Discharge status: Home / Self Care | Payer: Self-pay | Source: Ambulatory Visit | Attending: *Deleted | Admitting: *Deleted

## 2016-12-05 ENCOUNTER — Other Ambulatory Visit: Payer: Self-pay | Admitting: *Deleted

## 2016-12-05 DIAGNOSIS — Z9289 Personal history of other medical treatment: Secondary | ICD-10-CM

## 2017-01-26 ENCOUNTER — Encounter: Payer: Self-pay | Admitting: Obstetrics and Gynecology

## 2017-01-26 ENCOUNTER — Ambulatory Visit: Payer: BLUE CROSS/BLUE SHIELD | Admitting: Obstetrics and Gynecology

## 2017-01-26 VITALS — BP 145/90 | HR 71 | Ht 64.0 in | Wt 172.3 lb

## 2017-01-26 DIAGNOSIS — E8940 Asymptomatic postprocedural ovarian failure: Secondary | ICD-10-CM | POA: Diagnosis not present

## 2017-01-26 DIAGNOSIS — R6882 Decreased libido: Secondary | ICD-10-CM

## 2017-01-26 DIAGNOSIS — N939 Abnormal uterine and vaginal bleeding, unspecified: Secondary | ICD-10-CM | POA: Diagnosis not present

## 2017-01-26 DIAGNOSIS — Z1211 Encounter for screening for malignant neoplasm of colon: Secondary | ICD-10-CM

## 2017-01-26 DIAGNOSIS — Z90710 Acquired absence of both cervix and uterus: Secondary | ICD-10-CM | POA: Diagnosis not present

## 2017-01-26 LAB — POCT URINALYSIS DIPSTICK
BILIRUBIN UA: NEGATIVE
Blood, UA: NEGATIVE
GLUCOSE UA: NEGATIVE
Ketones, UA: NEGATIVE
LEUKOCYTES UA: NEGATIVE
NITRITE UA: NEGATIVE
Protein, UA: NEGATIVE
Spec Grav, UA: 1.025 (ref 1.010–1.025)
Urobilinogen, UA: 0.2 E.U./dL
pH, UA: 6 (ref 5.0–8.0)

## 2017-01-26 NOTE — Patient Instructions (Addendum)
1.  Urinalysis is obtained today to rule out blood in urine 2.  Stool guaiac cards are given for assessment of blood per rectum 3.  Free and total testosterone and sex hormone binding globulin are ordered today to assess for decreased libido 4.  Will consider off label use of testosterone for decreased libido if testosterone levels are low

## 2017-01-26 NOTE — Progress Notes (Signed)
Chief complaint: 1.  Vaginal bleeding 2.  Decreased libido  Patient is status post TVH BSO with posterior colporrhaphy in 2015.  She is taking estradiol 1 mg daily.  She is without vasomotor symptoms.  Sexual desire has waned over the past several years and frequency of intercourse and satisfaction with intercourse has diminished.  Patient noted episode of probable vaginal bleeding x1 in the past week.  She denies pelvic pain, UTI symptoms, or change in bowel function.  Patient is predisposed to mild constipation.  Past Medical History:  Diagnosis Date  . Abnormal Pap smear of cervix   . Abnormal vaginal bleeding   . Cyst of vulva   . Dyspareunia   . External hemorrhoid   . Fibroids   . High cholesterol   . History of UTI   . Hypertension   . Left-sided face pain   . Neck pain   . Paresthesia   . Rectocele   . Surgical menopause   . Trigeminal neuralgia   . Uterus prolapse     Past Surgical History:  Procedure Laterality Date  . AUGMENTATION MAMMAPLASTY Bilateral 2006  . BREAST ENHANCEMENT SURGERY    . CHOLECYSTECTOMY    . LAPAROSCOPY    . tummy tuck     . VAGINAL HYSTERECTOMY     lahv bso    Review of systems: Comprehensive review of systems is negative except for that noted in the HPI  OBJECTIVE: BP (!) 145/90   Pulse 71   Ht 5\' 4"  (1.626 m)   Wt 172 lb 4.8 oz (78.2 kg)   BMI 29.58 kg/m  Pleasant well-appearing female in no acute distress.  Alert and oriented. Back: No CVA tenderness Abdomen: Soft, nontender without organomegaly: No Pfannenstiel incision in lower abdomen from abdominoplasty is well-healed without evidence of hernia Bladder: Nontender Pelvic exam: External genitalia-normal BUS-normal Vagina-good estrogen effect; vaginal cuff is intact; minimal thin white secretions are present Cervix-surgically absent Uterus-surgically absent Adnexa-surgically absent Rectovaginal-external hemorrhoids, nonthrombosed; normal sphincter tone; no rectal  masses  ASSESSMENT: 1.  Episode of vaginal bleeding, unclear etiology without evidence of pathology today 2.  Cannot rule out hematuria 3.  Cannot rule out blood per rectum 4.  Surgical menopause 5.  Decreased libido  PLAN: 1.  Urinalysis 2.  Stool guaiac cards 3.  Return for recurrence of symptoms 4.  Free and total testosterone levels along with sex hormone binding globulin are ordered 5.  If low testosterone is identified, will consider trial of Depo testosterone cypionate and subsequent bioidentical testosterone cream (non-FDA approved)  A total of 25 minutes were spent face-to-face with the patient during this encounter and over half of that time involved counseling and coordination of care.  Brayton Mars, MD  Note: This dictation was prepared with Dragon dictation along with smaller phrase technology. Any transcriptional errors that result from this process are unintentional.

## 2017-01-28 LAB — SEX HORMONE BINDING GLOBULIN: SEX HORMONE BINDING: 166.6 nmol/L — AB (ref 24.6–122.0)

## 2017-01-28 LAB — TESTOSTERONE,FREE AND TOTAL
Testosterone, Free: 1.3 pg/mL (ref 0.0–4.2)
Testosterone: 14 ng/dL (ref 8–48)

## 2017-01-29 LAB — FECAL OCCULT BLOOD, IMMUNOCHEMICAL: Fecal Occult Bld: NEGATIVE

## 2017-02-13 DIAGNOSIS — M47816 Spondylosis without myelopathy or radiculopathy, lumbar region: Secondary | ICD-10-CM | POA: Insufficient documentation

## 2017-03-14 DIAGNOSIS — R519 Headache, unspecified: Secondary | ICD-10-CM

## 2017-03-14 HISTORY — DX: Headache, unspecified: R51.9

## 2017-03-20 ENCOUNTER — Other Ambulatory Visit: Payer: Self-pay | Admitting: Orthopedic Surgery

## 2017-03-20 DIAGNOSIS — M25551 Pain in right hip: Secondary | ICD-10-CM

## 2017-03-27 ENCOUNTER — Ambulatory Visit
Admission: RE | Admit: 2017-03-27 | Discharge: 2017-03-27 | Disposition: A | Discharge status: Home / Self Care | Payer: BLUE CROSS/BLUE SHIELD | Source: Ambulatory Visit | Attending: Orthopedic Surgery | Admitting: Orthopedic Surgery

## 2017-03-27 DIAGNOSIS — M25551 Pain in right hip: Secondary | ICD-10-CM

## 2017-03-27 MED ORDER — IOPAMIDOL (ISOVUE-200) INJECTION 41%
5.0000 mL | Freq: Once | INTRAVENOUS | Status: AC | PRN
  Administered 2017-03-27: 1 mL
  Filled 2017-03-27: qty 50

## 2017-03-27 MED ORDER — LIDOCAINE HCL (PF) 1 % IJ SOLN
5.0000 mL | Freq: Once | INTRAMUSCULAR | Status: AC
  Administered 2017-03-27: 5 mL
  Filled 2017-03-27: qty 5

## 2017-03-27 MED ORDER — GADOBENATE DIMEGLUMINE 529 MG/ML IV SOLN
0.1000 mL | Freq: Once | INTRAVENOUS | Status: AC | PRN
  Administered 2017-03-27: 0.1 mL via INTRA_ARTICULAR

## 2017-03-27 MED ORDER — SODIUM CHLORIDE 0.9 % IJ SOLN
20.0000 mL | INTRAMUSCULAR | Status: DC | PRN
  Administered 2017-03-27: 12 mL
  Filled 2017-03-27: qty 20

## 2017-03-28 ENCOUNTER — Other Ambulatory Visit: Payer: Self-pay | Admitting: Orthopedic Surgery

## 2017-03-28 DIAGNOSIS — M25551 Pain in right hip: Secondary | ICD-10-CM

## 2017-03-30 ENCOUNTER — Ambulatory Visit
Admission: RE | Admit: 2017-03-30 | Discharge: 2017-03-30 | Disposition: A | Discharge status: Home / Self Care | Payer: BLUE CROSS/BLUE SHIELD | Source: Ambulatory Visit | Attending: Orthopedic Surgery | Admitting: Orthopedic Surgery

## 2017-03-30 DIAGNOSIS — M25551 Pain in right hip: Secondary | ICD-10-CM | POA: Diagnosis not present

## 2017-03-30 DIAGNOSIS — M47817 Spondylosis without myelopathy or radiculopathy, lumbosacral region: Secondary | ICD-10-CM | POA: Insufficient documentation

## 2017-03-30 DIAGNOSIS — M47816 Spondylosis without myelopathy or radiculopathy, lumbar region: Secondary | ICD-10-CM | POA: Diagnosis not present

## 2017-04-25 ENCOUNTER — Other Ambulatory Visit: Payer: Self-pay

## 2017-04-25 ENCOUNTER — Encounter
Admission: RE | Admit: 2017-04-25 | Discharge: 2017-04-25 | Disposition: A | Discharge status: Home / Self Care | Payer: BLUE CROSS/BLUE SHIELD | Source: Ambulatory Visit | Attending: Orthopedic Surgery | Admitting: Orthopedic Surgery

## 2017-04-25 DIAGNOSIS — M25851 Other specified joint disorders, right hip: Secondary | ICD-10-CM | POA: Insufficient documentation

## 2017-04-25 DIAGNOSIS — Z888 Allergy status to other drugs, medicaments and biological substances status: Secondary | ICD-10-CM | POA: Insufficient documentation

## 2017-04-25 DIAGNOSIS — Z885 Allergy status to narcotic agent status: Secondary | ICD-10-CM | POA: Diagnosis not present

## 2017-04-25 DIAGNOSIS — Z01818 Encounter for other preprocedural examination: Secondary | ICD-10-CM | POA: Diagnosis not present

## 2017-04-25 HISTORY — DX: Other complications of anesthesia, initial encounter: T88.59XA

## 2017-04-25 HISTORY — DX: Adverse effect of unspecified anesthetic, initial encounter: T41.45XA

## 2017-04-25 HISTORY — DX: Gastro-esophageal reflux disease without esophagitis: K21.9

## 2017-04-25 NOTE — Patient Instructions (Addendum)
Your procedure is scheduled on: 05-05-17 Report to Same Day Surgery 2nd floor medical mall [facility] Entrance-take elevator on left to 2nd floor.  Check in with surgery information desk.) To find out your arrival time please call [phone] between 1PM - 3PM on 05-04-17  Remember: Instructions that are not followed completely may result in serious medical risk, up to and including death, or upon the discretion of your surgeon and anesthesiologist your surgery may need to be rescheduled.    _x___ 1. Do not eat food after midnight the night before your procedure. NO GUM OR CANDY AFTER MIDNIGHT.You may drink clear liquids up to 2 hours before you are scheduled to arrive at the hospital for your procedure.  Do not drink clear liquids within 2 hours of your scheduled arrival to the hospital.  Clear liquids include  --Water or Apple juice without pulp  --Clear carbohydrate beverage such as ClearFast or Gatorade  --Black Coffee or Clear Tea (No milk, no creamers, do not add anything to the coffee or Tea     __x__ 2. No Alcohol for 24 hours before or after surgery.   __x__3. No Smoking or e-cigarettes for 24 prior to surgery.  Do not use any chewable tobacco products for at least 6 hour prior to surgery   ____  4. Bring all medications with you on the day of surgery if instructed.    __x__ 5. Notify your doctor if there is any change in your medical condition     (cold, fever, infections).    x___6. On the morning of surgery brush your teeth with toothpaste and water.  You may rinse your mouth with mouth wash if you wish.  Do not swallow any toothpaste or mouthwash.   Do not wear jewelry, make-up, hairpins, clips or nail polish.  Do not wear lotions, powders, or perfumes. You may wear deodorant.  Do not shave 48 hours prior to surgery. Men may shave face and neck.  Do not bring valuables to the hospital.    [facility] is not responsible for any belongings or valuables.    Contacts, dentures or bridgework may not be worn into surgery.  Leave your suitcase in the car. After surgery it may be brought to your room.  For patients admitted to the hospital, discharge time is determined by your  treatment team.  _  Patients discharged the day of surgery will not be allowed to drive home.  You will need someone to drive you home and stay with you the night of your procedure.   _x___ TAKE THE FOLLOWING MEDICATION THE MORNING OF SURGERY WITH A SMALL SIP OF WATER. These include:  1. PRILOSEC  2. TAKE A PRILOSEC Thursday NIGHT BEFORE BED  3.  4.  5.  6.  ____Fleets enema or Magnesium Citrate as directed.   ____ Use CHG Soap or sage wipes as directed on instruction sheet   ____ Use inhalers on the day of surgery and bring to hospital day of surgery  ____ Stop Metformin and Janumet 2 days prior to surgery.    ____ Take 1/2 of usual insulin dose the night before surgery and none on the morning surgery.   ____ Follow recommendations from Cardiologist, Pulmonologist or PCP regarding stopping Aspirin, Coumadin, Plavix ,Eliquis, Effient, or Pradaxa, and Pletal.  X____Stop Anti-inflammatories such as Advil, Aleve, Ibuprofen, Motrin, Naproxen, MELOXICAM, Naprosyn, Goodies powders or aspirin products 7 DAYS PRIOR TO SURGERY-OK to take Tylenol    _x___ Stop supplements until  after surgery-STOP BIOTIN AND PHENTERMINE 7 DAYS PRIOR TO SURGERY-MAY RESUME AFTER SURGERY   ____ Bring C-Pap to the hospital.

## 2017-04-27 ENCOUNTER — Other Ambulatory Visit: Payer: BLUE CROSS/BLUE SHIELD

## 2017-05-04 MED ORDER — CEFAZOLIN SODIUM-DEXTROSE 2-4 GM/100ML-% IV SOLN
2.0000 g | Freq: Once | INTRAVENOUS | Status: AC
  Administered 2017-05-05 (×2): 2 g via INTRAVENOUS

## 2017-05-05 ENCOUNTER — Other Ambulatory Visit: Payer: Self-pay

## 2017-05-05 ENCOUNTER — Ambulatory Visit: Payer: BLUE CROSS/BLUE SHIELD | Admitting: Anesthesiology

## 2017-05-05 ENCOUNTER — Encounter
Admission: RE | Disposition: A | Discharge status: Home / Self Care | Payer: Self-pay | Source: Ambulatory Visit | Attending: Orthopedic Surgery

## 2017-05-05 ENCOUNTER — Encounter: Payer: Self-pay | Admitting: *Deleted

## 2017-05-05 ENCOUNTER — Observation Stay
Admission: RE | Admit: 2017-05-05 | Discharge: 2017-05-06 | Disposition: A | Discharge status: Home / Self Care | Payer: BLUE CROSS/BLUE SHIELD | Source: Ambulatory Visit | Attending: Orthopedic Surgery | Admitting: Orthopedic Surgery

## 2017-05-05 ENCOUNTER — Ambulatory Visit: Payer: BLUE CROSS/BLUE SHIELD

## 2017-05-05 DIAGNOSIS — Z79899 Other long term (current) drug therapy: Secondary | ICD-10-CM | POA: Insufficient documentation

## 2017-05-05 DIAGNOSIS — M25451 Effusion, right hip: Secondary | ICD-10-CM | POA: Diagnosis not present

## 2017-05-05 DIAGNOSIS — Z888 Allergy status to other drugs, medicaments and biological substances status: Secondary | ICD-10-CM | POA: Insufficient documentation

## 2017-05-05 DIAGNOSIS — Z419 Encounter for procedure for purposes other than remedying health state, unspecified: Secondary | ICD-10-CM

## 2017-05-05 DIAGNOSIS — M5136 Other intervertebral disc degeneration, lumbar region: Secondary | ICD-10-CM | POA: Insufficient documentation

## 2017-05-05 DIAGNOSIS — M25851 Other specified joint disorders, right hip: Secondary | ICD-10-CM | POA: Diagnosis not present

## 2017-05-05 DIAGNOSIS — M25859 Other specified joint disorders, unspecified hip: Secondary | ICD-10-CM | POA: Diagnosis present

## 2017-05-05 DIAGNOSIS — Z886 Allergy status to analgesic agent status: Secondary | ICD-10-CM | POA: Insufficient documentation

## 2017-05-05 DIAGNOSIS — I10 Essential (primary) hypertension: Secondary | ICD-10-CM | POA: Diagnosis not present

## 2017-05-05 DIAGNOSIS — E7800 Pure hypercholesterolemia, unspecified: Secondary | ICD-10-CM | POA: Insufficient documentation

## 2017-05-05 DIAGNOSIS — Z885 Allergy status to narcotic agent status: Secondary | ICD-10-CM | POA: Insufficient documentation

## 2017-05-05 DIAGNOSIS — M160 Bilateral primary osteoarthritis of hip: Principal | ICD-10-CM | POA: Insufficient documentation

## 2017-05-05 DIAGNOSIS — M5137 Other intervertebral disc degeneration, lumbosacral region: Secondary | ICD-10-CM | POA: Diagnosis not present

## 2017-05-05 DIAGNOSIS — S73191A Other sprain of right hip, initial encounter: Secondary | ICD-10-CM | POA: Insufficient documentation

## 2017-05-05 DIAGNOSIS — K219 Gastro-esophageal reflux disease without esophagitis: Secondary | ICD-10-CM | POA: Diagnosis not present

## 2017-05-05 DIAGNOSIS — M1611 Unilateral primary osteoarthritis, right hip: Secondary | ICD-10-CM | POA: Diagnosis present

## 2017-05-05 DIAGNOSIS — X58XXXA Exposure to other specified factors, initial encounter: Secondary | ICD-10-CM | POA: Insufficient documentation

## 2017-05-05 DIAGNOSIS — M25752 Osteophyte, left hip: Secondary | ICD-10-CM | POA: Diagnosis not present

## 2017-05-05 DIAGNOSIS — Z7982 Long term (current) use of aspirin: Secondary | ICD-10-CM | POA: Insufficient documentation

## 2017-05-05 DIAGNOSIS — M25452 Effusion, left hip: Secondary | ICD-10-CM | POA: Diagnosis not present

## 2017-05-05 HISTORY — PX: HIP ARTHROSCOPY: SHX668

## 2017-05-05 SURGERY — ARTHROSCOPY HIP
Anesthesia: General | Site: Hip | Laterality: Right

## 2017-05-05 MED ORDER — ONDANSETRON HCL 4 MG PO TABS: 4.0000 mg | ORAL_TABLET | Freq: Four times a day (QID) | ORAL | Status: DC | PRN

## 2017-05-05 MED ORDER — LIDOCAINE HCL (PF) 1 % IJ SOLN
INTRAMUSCULAR | Status: DC | PRN
  Administered 2017-05-05: 30 mL

## 2017-05-05 MED ORDER — GABAPENTIN 300 MG PO CAPS
ORAL_CAPSULE | ORAL | Status: AC
  Administered 2017-05-05: 300 mg
  Filled 2017-05-05: qty 1

## 2017-05-05 MED ORDER — CEFAZOLIN SODIUM 1 G IJ SOLR
INTRAMUSCULAR | Status: AC
  Filled 2017-05-05: qty 20

## 2017-05-05 MED ORDER — MIDAZOLAM HCL 2 MG/2ML IJ SOLN
INTRAMUSCULAR | Status: AC
  Filled 2017-05-05: qty 2

## 2017-05-05 MED ORDER — PROPOFOL 10 MG/ML IV BOLUS
INTRAVENOUS | Status: DC | PRN
  Administered 2017-05-05: 200 mg via INTRAVENOUS

## 2017-05-05 MED ORDER — FENTANYL CITRATE (PF) 100 MCG/2ML IJ SOLN
INTRAMUSCULAR | Status: DC
Start: 2017-05-05 — End: 2017-05-05
  Filled 2017-05-05: qty 2

## 2017-05-05 MED ORDER — SODIUM CHLORIDE 0.9 % IV SOLN
INTRAVENOUS | Status: DC
  Administered 2017-05-05: 17:00:00 via INTRAVENOUS

## 2017-05-05 MED ORDER — ESMOLOL HCL 100 MG/10ML IV SOLN
INTRAVENOUS | Status: DC | PRN
  Administered 2017-05-05 (×2): 30 mg via INTRAVENOUS
  Administered 2017-05-05: 10 mg via INTRAVENOUS
  Administered 2017-05-05: 30 mg via INTRAVENOUS

## 2017-05-05 MED ORDER — CELECOXIB 200 MG PO CAPS
200.0000 mg | ORAL_CAPSULE | Freq: Once | ORAL | Status: AC
  Administered 2017-05-05: 200 mg via ORAL

## 2017-05-05 MED ORDER — HYDROMORPHONE HCL 1 MG/ML IJ SOLN
0.5000 mg | INTRAMUSCULAR | Status: DC | PRN
  Administered 2017-05-05 (×2): 0.5 mg via INTRAVENOUS
  Filled 2017-05-05 (×2): qty 1

## 2017-05-05 MED ORDER — ONDANSETRON HCL 4 MG/2ML IJ SOLN
INTRAMUSCULAR | Status: DC | PRN
  Administered 2017-05-05: 4 mg via INTRAVENOUS

## 2017-05-05 MED ORDER — KETOROLAC TROMETHAMINE 15 MG/ML IJ SOLN
INTRAMUSCULAR | Status: AC
  Filled 2017-05-05: qty 1

## 2017-05-05 MED ORDER — DIPHENHYDRAMINE HCL 12.5 MG/5ML PO ELIX
12.5000 mg | ORAL_SOLUTION | ORAL | Status: DC | PRN
  Administered 2017-05-06 (×3): 25 mg via ORAL
  Filled 2017-05-05 (×3): qty 10

## 2017-05-05 MED ORDER — LACTATED RINGERS IV SOLN: INTRAVENOUS | Status: DC

## 2017-05-05 MED ORDER — SENNOSIDES-DOCUSATE SODIUM 8.6-50 MG PO TABS: 1.0000 | ORAL_TABLET | Freq: Every evening | ORAL | Status: DC | PRN

## 2017-05-05 MED ORDER — KETOROLAC TROMETHAMINE 30 MG/ML IJ SOLN
15.0000 mg | Freq: Once | INTRAMUSCULAR | Status: AC
  Administered 2017-05-05: 15 mg via INTRAVENOUS
  Filled 2017-05-05: qty 1

## 2017-05-05 MED ORDER — ACETAMINOPHEN 500 MG PO TABS
1000.0000 mg | ORAL_TABLET | Freq: Once | ORAL | Status: AC
  Administered 2017-05-05: 1000 mg via ORAL

## 2017-05-05 MED ORDER — LIDOCAINE HCL (CARDIAC) 20 MG/ML IV SOLN
INTRAVENOUS | Status: DC | PRN
  Administered 2017-05-05: 80 mg via INTRAVENOUS

## 2017-05-05 MED ORDER — NAPROXEN 500 MG PO TABS
500.0000 mg | ORAL_TABLET | Freq: Two times a day (BID) | ORAL | Status: DC
  Administered 2017-05-06: 500 mg via ORAL
  Filled 2017-05-05 (×3): qty 1

## 2017-05-05 MED ORDER — SUCCINYLCHOLINE CHLORIDE 20 MG/ML IJ SOLN
INTRAMUSCULAR | Status: AC
  Filled 2017-05-05: qty 1

## 2017-05-05 MED ORDER — MORPHINE SULFATE (PF) 4 MG/ML IV SOLN
INTRAVENOUS | Status: DC | PRN
  Administered 2017-05-05: 4 mg

## 2017-05-05 MED ORDER — METHOCARBAMOL 1000 MG/10ML IJ SOLN
500.0000 mg | Freq: Four times a day (QID) | INTRAMUSCULAR | Status: DC | PRN
  Administered 2017-05-05: 500 mg via INTRAVENOUS
  Filled 2017-05-05 (×2): qty 5

## 2017-05-05 MED ORDER — LACTATED RINGERS IV SOLN
INTRAVENOUS | Status: DC | PRN
  Administered 2017-05-05 (×3): via INTRAVENOUS

## 2017-05-05 MED ORDER — PHENTERMINE HCL 37.5 MG PO TABS: 37.5000 mg | ORAL_TABLET | Freq: Every day | ORAL | Status: DC

## 2017-05-05 MED ORDER — OMEPRAZOLE MAGNESIUM 20 MG PO TBEC: 20.0000 mg | DELAYED_RELEASE_TABLET | Freq: Every day | ORAL | Status: DC | PRN

## 2017-05-05 MED ORDER — ASPIRIN EC 325 MG PO TBEC
325.0000 mg | DELAYED_RELEASE_TABLET | Freq: Every day | ORAL | Status: DC
  Administered 2017-05-06: 325 mg via ORAL
  Filled 2017-05-05: qty 1

## 2017-05-05 MED ORDER — GABAPENTIN 300 MG PO CAPS: 300.0000 mg | ORAL_CAPSULE | Freq: Once | ORAL | Status: AC

## 2017-05-05 MED ORDER — CEFAZOLIN SODIUM-DEXTROSE 2-4 GM/100ML-% IV SOLN
INTRAVENOUS | Status: AC
  Filled 2017-05-05: qty 100

## 2017-05-05 MED ORDER — ROCURONIUM BROMIDE 50 MG/5ML IV SOLN
INTRAVENOUS | Status: AC
  Filled 2017-05-05: qty 4

## 2017-05-05 MED ORDER — PROPOFOL 10 MG/ML IV BOLUS
INTRAVENOUS | Status: AC
  Filled 2017-05-05: qty 60

## 2017-05-05 MED ORDER — SUGAMMADEX SODIUM 200 MG/2ML IV SOLN
INTRAVENOUS | Status: DC | PRN
  Administered 2017-05-05: 160 mg via INTRAVENOUS

## 2017-05-05 MED ORDER — ROCURONIUM BROMIDE 50 MG/5ML IV SOLN
INTRAVENOUS | Status: AC
  Filled 2017-05-05: qty 2

## 2017-05-05 MED ORDER — HYDRALAZINE HCL 20 MG/ML IJ SOLN
INTRAMUSCULAR | Status: AC
  Filled 2017-05-05: qty 1

## 2017-05-05 MED ORDER — ACETAMINOPHEN 500 MG PO TABS
1000.0000 mg | ORAL_TABLET | Freq: Three times a day (TID) | ORAL | Status: DC
  Administered 2017-05-05 – 2017-05-06 (×3): 1000 mg via ORAL
  Filled 2017-05-05 (×3): qty 2

## 2017-05-05 MED ORDER — DEXAMETHASONE SODIUM PHOSPHATE 10 MG/ML IJ SOLN
INTRAMUSCULAR | Status: AC
  Filled 2017-05-05: qty 1

## 2017-05-05 MED ORDER — LABETALOL HCL 5 MG/ML IV SOLN
5.0000 mg | INTRAVENOUS | Status: AC | PRN
  Administered 2017-05-05 (×4): 5 mg via INTRAVENOUS

## 2017-05-05 MED ORDER — SUGAMMADEX SODIUM 200 MG/2ML IV SOLN
INTRAVENOUS | Status: AC
  Filled 2017-05-05: qty 2

## 2017-05-05 MED ORDER — EPINEPHRINE 30 MG/30ML IJ SOLN
INTRAMUSCULAR | Status: AC
  Filled 2017-05-05: qty 2

## 2017-05-05 MED ORDER — LABETALOL HCL 5 MG/ML IV SOLN
INTRAVENOUS | Status: AC
  Administered 2017-05-05: 5 mg via INTRAVENOUS
  Filled 2017-05-05: qty 4

## 2017-05-05 MED ORDER — OXYCODONE HCL 5 MG PO TABS
5.0000 mg | ORAL_TABLET | ORAL | Status: DC | PRN
  Administered 2017-05-05: 5 mg via ORAL
  Administered 2017-05-06 (×4): 10 mg via ORAL
  Filled 2017-05-05 (×2): qty 2
  Filled 2017-05-05: qty 1
  Filled 2017-05-05 (×2): qty 2

## 2017-05-05 MED ORDER — ROPIVACAINE HCL 5 MG/ML IJ SOLN
INTRAMUSCULAR | Status: DC | PRN
  Administered 2017-05-05: 20 mL

## 2017-05-05 MED ORDER — ACETAMINOPHEN 650 MG RE SUPP: 650.0000 mg | Freq: Four times a day (QID) | RECTAL | Status: DC | PRN

## 2017-05-05 MED ORDER — ONDANSETRON HCL 4 MG/2ML IJ SOLN: 4.0000 mg | Freq: Four times a day (QID) | INTRAMUSCULAR | Status: DC | PRN

## 2017-05-05 MED ORDER — FENTANYL CITRATE (PF) 250 MCG/5ML IJ SOLN
INTRAMUSCULAR | Status: AC
  Filled 2017-05-05: qty 5

## 2017-05-05 MED ORDER — METHOCARBAMOL 500 MG PO TABS
500.0000 mg | ORAL_TABLET | Freq: Four times a day (QID) | ORAL | Status: DC | PRN
  Administered 2017-05-06 (×2): 500 mg via ORAL
  Filled 2017-05-05 (×3): qty 1

## 2017-05-05 MED ORDER — ACETAMINOPHEN 10 MG/ML IV SOLN
INTRAVENOUS | Status: AC
  Filled 2017-05-05: qty 200

## 2017-05-05 MED ORDER — LIDOCAINE HCL (PF) 1 % IJ SOLN
INTRAMUSCULAR | Status: AC
  Filled 2017-05-05: qty 30

## 2017-05-05 MED ORDER — PANTOPRAZOLE SODIUM 40 MG PO TBEC
40.0000 mg | DELAYED_RELEASE_TABLET | Freq: Every day | ORAL | Status: DC | PRN
  Administered 2017-05-06: 40 mg via ORAL
  Filled 2017-05-05: qty 1

## 2017-05-05 MED ORDER — FENTANYL CITRATE (PF) 100 MCG/2ML IJ SOLN
INTRAMUSCULAR | Status: DC | PRN
  Administered 2017-05-05: 50 ug via INTRAVENOUS
  Administered 2017-05-05: 100 ug via INTRAVENOUS
  Administered 2017-05-05 (×2): 50 ug via INTRAVENOUS
  Administered 2017-05-05: 100 ug via INTRAVENOUS
  Administered 2017-05-05 (×3): 50 ug via INTRAVENOUS

## 2017-05-05 MED ORDER — ROPIVACAINE HCL 5 MG/ML IJ SOLN
INTRAMUSCULAR | Status: AC
Start: 2017-05-05 — End: 2017-05-05
  Filled 2017-05-05: qty 20

## 2017-05-05 MED ORDER — KETOROLAC TROMETHAMINE 30 MG/ML IJ SOLN
INTRAMUSCULAR | Status: DC | PRN
  Administered 2017-05-05: 30 mg via INTRAVENOUS

## 2017-05-05 MED ORDER — HYDRALAZINE HCL 20 MG/ML IJ SOLN
10.0000 mg | Freq: Once | INTRAMUSCULAR | Status: AC
  Administered 2017-05-05: 10 mg via INTRAVENOUS

## 2017-05-05 MED ORDER — EPINEPHRINE 30 MG/30ML IJ SOLN
INTRAMUSCULAR | Status: DC | PRN
  Administered 2017-05-05: 62 mL

## 2017-05-05 MED ORDER — ACETAMINOPHEN 500 MG PO TABS
ORAL_TABLET | ORAL | Status: AC
  Administered 2017-05-05: 1000 mg via ORAL
  Filled 2017-05-05: qty 2

## 2017-05-05 MED ORDER — CELECOXIB 200 MG PO CAPS
ORAL_CAPSULE | ORAL | Status: AC
  Administered 2017-05-05: 200 mg via ORAL
  Filled 2017-05-05: qty 1

## 2017-05-05 MED ORDER — MONTELUKAST SODIUM 10 MG PO TABS
10.0000 mg | ORAL_TABLET | Freq: Every day | ORAL | Status: DC
  Administered 2017-05-05: 10 mg via ORAL
  Filled 2017-05-05: qty 1

## 2017-05-05 MED ORDER — ESTRADIOL 1 MG PO TABS
2.0000 mg | ORAL_TABLET | Freq: Every evening | ORAL | Status: DC
  Filled 2017-05-05: qty 2
  Filled 2017-05-05: qty 1

## 2017-05-05 MED ORDER — PHENYLEPHRINE HCL 10 MG/ML IJ SOLN
INTRAMUSCULAR | Status: AC
  Filled 2017-05-05: qty 1

## 2017-05-05 MED ORDER — DEXAMETHASONE SODIUM PHOSPHATE 10 MG/ML IJ SOLN
INTRAMUSCULAR | Status: DC | PRN
  Administered 2017-05-05: 10 mg via INTRAVENOUS

## 2017-05-05 MED ORDER — MIDAZOLAM HCL 2 MG/2ML IJ SOLN
INTRAMUSCULAR | Status: DC | PRN
  Administered 2017-05-05: 2 mg via INTRAVENOUS

## 2017-05-05 MED ORDER — FENTANYL CITRATE (PF) 250 MCG/5ML IJ SOLN
INTRAMUSCULAR | Status: AC
Start: 2017-05-05 — End: 2017-05-05
  Filled 2017-05-05: qty 5

## 2017-05-05 MED ORDER — ACETAMINOPHEN 325 MG PO TABS: 650.0000 mg | ORAL_TABLET | Freq: Four times a day (QID) | ORAL | Status: DC | PRN

## 2017-05-05 MED ORDER — ACETAMINOPHEN 10 MG/ML IV SOLN
INTRAVENOUS | Status: DC | PRN
  Administered 2017-05-05: 1000 mg via INTRAVENOUS

## 2017-05-05 MED ORDER — ROCURONIUM BROMIDE 100 MG/10ML IV SOLN
INTRAVENOUS | Status: DC | PRN
  Administered 2017-05-05: 10 mg via INTRAVENOUS
  Administered 2017-05-05: 20 mg via INTRAVENOUS
  Administered 2017-05-05: 50 mg via INTRAVENOUS
  Administered 2017-05-05: 30 mg via INTRAVENOUS
  Administered 2017-05-05: 10 mg via INTRAVENOUS
  Administered 2017-05-05: 20 mg via INTRAVENOUS
  Administered 2017-05-05: 10 mg via INTRAVENOUS
  Administered 2017-05-05: 20 mg via INTRAVENOUS

## 2017-05-05 MED ORDER — CEFAZOLIN SODIUM-DEXTROSE 2-4 GM/100ML-% IV SOLN
2.0000 g | Freq: Four times a day (QID) | INTRAVENOUS | Status: AC
  Administered 2017-05-05 – 2017-05-06 (×3): 2 g via INTRAVENOUS
  Filled 2017-05-05 (×3): qty 100

## 2017-05-05 MED ORDER — DOCUSATE SODIUM 100 MG PO CAPS
100.0000 mg | ORAL_CAPSULE | Freq: Two times a day (BID) | ORAL | Status: DC
  Administered 2017-05-05 – 2017-05-06 (×2): 100 mg via ORAL
  Filled 2017-05-05 (×2): qty 1

## 2017-05-05 MED ORDER — MORPHINE SULFATE (PF) 4 MG/ML IV SOLN
INTRAVENOUS | Status: AC
  Filled 2017-05-05: qty 1

## 2017-05-05 SURGICAL SUPPLY — 65 items
ADAPTER IRRIG TUBE 2 SPIKE SOL (ADAPTER) ×4 IMPLANT
ANCHOR SUT 1.4 FLEX (Anchor) ×2 IMPLANT
ANCHOR SUT 2.4 SS KNTLS #1 (Anchor) ×4 IMPLANT
BIT DRILL FLEX NANOTACK (BIT) ×2 IMPLANT
BIT DRILL SS CINCHLOCK (BIT) ×2 IMPLANT
BLADE SAMURAI STR FULL RADIUS (BLADE) ×2 IMPLANT
BLADE SURG SZ11 CARB STEEL (BLADE) ×2 IMPLANT
BNDG ADH 2 X3.75 FABRIC TAN LF (GAUZE/BANDAGES/DRESSINGS) ×10 IMPLANT
BUR 4.0 ROUND XL DIAMOND (BUR) ×2 IMPLANT
BUR 5.5 ROUND LONG FS 8 FLUTE (BUR) ×2 IMPLANT
CANNULA 8 456 TRANSPORT (CANNULA) ×2 IMPLANT
CANNULA 8 789 TRANSPORT (CANNULA) ×4 IMPLANT
CANNULA OBTURATOR FLOWPORT (CANNULA) ×2 IMPLANT
CHLORAPREP W/TINT 26ML (MISCELLANEOUS) ×2 IMPLANT
CRADLE LAMINECT ARM (MISCELLANEOUS) ×2 IMPLANT
CUTTER AGGRESSIVE PLUS 4D 180L (CUTTER) ×2 IMPLANT
DEVICE SUCT BLK HOLE OR FLOOR (MISCELLANEOUS) ×2 IMPLANT
DRAPE C-ARM 42X72 X-RAY (DRAPES) ×4 IMPLANT
DRAPE SHEET LG 3/4 BI-LAMINATE (DRAPES) IMPLANT
DRAPE SURG 17X11 SM STRL (DRAPES) ×2 IMPLANT
GAUZE SPONGE 4X4 12PLY STRL (GAUZE/BANDAGES/DRESSINGS) ×2 IMPLANT
GLOVE BIOGEL PI IND STRL 7.0 (GLOVE) ×6 IMPLANT
GLOVE BIOGEL PI IND STRL 8 (GLOVE) ×1 IMPLANT
GLOVE BIOGEL PI INDICATOR 7.0 (GLOVE) ×6
GLOVE BIOGEL PI INDICATOR 8 (GLOVE) ×1
GLOVE SURG ORTHO 8.0 STRL STRW (GLOVE) ×4 IMPLANT
GOWN STRL REUS W/ TWL LRG LVL3 (GOWN DISPOSABLE) ×4 IMPLANT
GOWN STRL REUS W/ TWL XL LVL3 (GOWN DISPOSABLE) ×1 IMPLANT
GOWN STRL REUS W/TWL LRG LVL3 (GOWN DISPOSABLE) ×4
GOWN STRL REUS W/TWL XL LVL3 (GOWN DISPOSABLE) ×1
HOLDER FOLEY CATH W/STRAP (MISCELLANEOUS) ×2 IMPLANT
IV LACTATED RINGER IRRG 3000ML (IV SOLUTION) ×62
IV LR IRRIG 3000ML ARTHROMATIC (IV SOLUTION) ×62 IMPLANT
KIT PATIENT POSITION MEDIUM (KITS) ×2 IMPLANT
KIT PORTAL ENTRY HIP ACCESS (KITS) ×2 IMPLANT
KIT TURNOVER CYSTO (KITS) ×2 IMPLANT
MANIFOLD NEPTUNE II (INSTRUMENTS) ×2 IMPLANT
MAT BLUE FLOOR 46X72 FLO (MISCELLANEOUS) ×2 IMPLANT
NDL SAFETY ECLIPSE 18X1.5 (NEEDLE) ×2 IMPLANT
NEEDLE FILTER BLUNT 18X 1/2SAF (NEEDLE) ×1
NEEDLE FILTER BLUNT 18X1 1/2 (NEEDLE) ×1 IMPLANT
NEEDLE HYPO 18GX1.5 SHARP (NEEDLE) ×2
NEEDLE INJECTOR II CARTRIDGE (MISCELLANEOUS) ×2 IMPLANT
PACK ARTHROSCOPY KNEE (MISCELLANEOUS) ×4 IMPLANT
PACK UNIVERSAL (MISCELLANEOUS) ×2 IMPLANT
PAD ABD DERMACEA PRESS 5X9 (GAUZE/BANDAGES/DRESSINGS) ×2 IMPLANT
PAD PREP 24X41 OB/GYN DISP (PERSONAL CARE ITEMS) ×2 IMPLANT
PASSER SUT 1.5D CRESCENT (INSTRUMENTS) ×2 IMPLANT
PASSER SUT 70D UP ANGLED (INSTRUMENTS) ×4 IMPLANT
SERFAS 50-S SWEEP XL (INSTRUMENTS) ×2
SET TUBE SUCT SHAVER OUTFL 24K (TUBING) ×2 IMPLANT
SUT ETHILON 3-0 FS-10 30 BLK (SUTURE) ×2
SUT FORCE FIBER 2 38IN K BLUE (SUTURE) ×2
SUT VIC AB 2-0 CT2 27 (SUTURE) ×2 IMPLANT
SUT ZIPLINE SZ2 BLK (SUTURE) ×8 IMPLANT
SUT ZIPLINE SZ2 GREEN (SUTURE) ×16 IMPLANT
SUTURE EHLN 3-0 FS-10 30 BLK (SUTURE) ×1 IMPLANT
SUTURE FORCE FIBER 2 38IN K BL (SUTURE) ×1 IMPLANT
SUTURE TAPE XBRAID 1.2 BLUE 45 (SUTURE) ×1 IMPLANT
SUTURETAPE XBRAID 1.2 BLUE 45 (SUTURE) ×2
SYR 10ML LL (SYRINGE) ×2 IMPLANT
SYR 3ML LL SCALE MARK (SYRINGE) ×2 IMPLANT
TRAY FOLEY CATH SILVER 16FR LF (SET/KITS/TRAYS/PACK) ×4 IMPLANT
TUBING ARTHRO INFLOW-ONLY STRL (TUBING) ×2 IMPLANT
WAND SERFAS 50-S SWEEP XL (INSTRUMENTS) ×1 IMPLANT

## 2017-05-05 NOTE — Anesthesia Post-op Follow-up Note (Signed)
Anesthesia QCDR form completed.        

## 2017-05-05 NOTE — Discharge Instructions (Addendum)
Hip Arthroscopy Post-Operative Instructions  1. Physical Therapy should start within 3-4 days of surgery. 2. If oozing from surgery site occurs, and the dressing appears soaked with bloody fluid please change the dressing as needed. This normally occurs after fluid irrigation during surgery, and will resolve within 24-36 hours. 3. Icing is very important for the first 5-7 days postoperative, and ice is applied (ice packs or ice therapy) as often as possible or at least for 20-minute periods 3-4 times per day. Ice should not be applied directly on the skin. 4. Physical therapist will remove dressing. 5. Apply Band-Aids to wound sites and change them once a day. Keep the wound clean and dry. 6. Please do not use bacitracin or other ointments under the bandage. 7. Showering is allowed on post-op day #4 if the wound is dry. MAKE SURE EACH INCISION IS COVERED WITH A WATERPROOF BANDAID DURING SHOWER ONLY! 8. Do not soak the hip in water in a bathtub or pool until the sutures are removed. Typically getting into a bath or pool is permitted 4 weeks after surgery.  9. Driving is permitted after 1 week for L hip surgery only if the narcotic pain medication is no longer being taken and you feel comfortable getting into and out of a car. For R hip surgery, driving is permitted after 2 weeks. Driving a manual car may take up to 3-4 weeks. 10. Please ensure you have a follow-up appointment for suture removal ~2 weeks after surgery.  11. The anesthetic drugs used during your surgery may cause nausea for the first 24 hours. If nausea is encountered, drink only clear liquids (i.e. Sprite or 7-up). The only solids should be dry crackers or toast. If nausea and vomiting become severe or the patient shows sign of dehydration (lack of urination) please call the doctor or the surgicenter. 12. If you develop a fever (101.5), redness, or yellow/brown/green drainage from the surgical incision site, please call our office to  arrange for an evaluation.  13: POST-OPERATIVE PRESCRIPTIONS:  HETERTOPIC BONE PROPHYLAXIS FOR 10 DAYS: 1. EC-Naprosyn 500mg , 1 tablet by mouth two times per day x 30 days 2. Prilosec (Stomach Prophylaxis) 20mg , 1 tablet by mouth daily (take on an empty stomach x 30 days  DVT PROPHYLAXIS 3. Aspirin 325mg  by mouth daily x 2 weeks  PAIN MEDICATION:  4. Oxycodone 1 to 2 tablets by mouth every 4 hours as needed 5. Tylenol 1000mg  three times day for at least 3 days, then as needed to reduce narcotics  ANTI-NAUSEA (if applicable):  6. Zofran 4mg  tablet, 1 tablet every 6 hours as needed. You will be given a prescription, but it is optional to fill it.  ANTI-SPASM (if applicable):  7. Zanaflex 4mg , 2 tablets by mouth every 6 hours as needed.  14. You will take as aspirin (325 mg) daily x 2 weeks. This may lower the risk of a blood clot developing after surgery. Should severe calf pain occur or significant swelling of calf and ankle, please call our offices. 15. Local anesthetics (i.e. Novocaine) are put into the incision after surgery. It is not uncommon for patients to encounter more pain on the first or second day after surgery. This is the time when swelling peaks. Taking pain medication before bedtime will assist in sleeping. It is important not to drink or drive while taking narcotic medication. You should resume your normal medications for other conditions the day after surgery. 16. Follow weight bearing instructions as advised at discharge. Crutches may  be necessary to assist walking. Extremity elevation for the first 72 hours is also encouraged to minimize swelling. 17. If unexpected problems occur and you need to speak to the doctor, call the office.   Important Personal assistant Musician): [phone] Fax Number: [phone]

## 2017-05-05 NOTE — Progress Notes (Signed)
At the request of the family, Chaplain visited patient, Discussion of nervousness and her faith ensued. Chaplain will return to pray with patient and family.

## 2017-05-05 NOTE — Anesthesia Preprocedure Evaluation (Signed)
Anesthesia Evaluation  Patient identified by MRN, date of birth, ID band Patient awake    Reviewed: Allergy & Precautions, NPO status , Patient's Chart, lab work & pertinent test results  History of Anesthesia Complications (+) Family history of anesthesia reaction and history of anesthetic complications (father with exacerbation of alzhiemers)  Airway Mallampati: II       Dental   Pulmonary neg sleep apnea, neg COPD,           Cardiovascular hypertension (off meds now), (-) Past MI and (-) CHF (-) dysrhythmias (-) Valvular Problems/Murmurs     Neuro/Psych neg Seizures    GI/Hepatic Neg liver ROS, GERD  Medicated and Controlled,  Endo/Other  neg diabetes  Renal/GU negative Renal ROS     Musculoskeletal   Abdominal   Peds  Hematology   Anesthesia Other Findings   Reproductive/Obstetrics                             Anesthesia Physical Anesthesia Plan  ASA: II  Anesthesia Plan: General   Post-op Pain Management:    Induction: Intravenous  PONV Risk Score and Plan: 3 and Dexamethasone, Ondansetron and Midazolam  Airway Management Planned: Oral ETT  Additional Equipment:   Intra-op Plan:   Post-operative Plan:   Informed Consent: I have reviewed the patients History and Physical, chart, labs and discussed the procedure including the risks, benefits and alternatives for the proposed anesthesia with the patient or authorized representative who has indicated his/her understanding and acceptance.     Plan Discussed with:   Anesthesia Plan Comments:         Anesthesia Quick Evaluation

## 2017-05-05 NOTE — Anesthesia Postprocedure Evaluation (Signed)
Anesthesia Post Note  Patient: Paula Moses  Procedure(s) Performed: ARTHROSCOPY HIP (Right Hip)  Patient location during evaluation: PACU Anesthesia Type: General Level of consciousness: awake and alert Pain management: pain level controlled Vital Signs Assessment: post-procedure vital signs reviewed and stable Respiratory status: spontaneous breathing and respiratory function stable Cardiovascular status: stable Anesthetic complications: no     Last Vitals:  Vitals:   05/05/17 0632 05/05/17 1505  BP: (!) 159/94 128/69  Pulse: 72 (!) 108  Resp: 16 16  Temp: 36.6 C 36.6 C  SpO2: 100% 97%    Last Pain:  Vitals:   05/05/17 1505  TempSrc:   PainSc: Asleep                 KEPHART,WILLIAM K

## 2017-05-05 NOTE — Transfer of Care (Signed)
Immediate Anesthesia Transfer of Care Note  Patient: Paula Moses  Procedure(s) Performed: ARTHROSCOPY HIP (Right Hip)  Patient Location: PACU  Anesthesia Type:General  Level of Consciousness: sedated and responds to stimulation  Airway & Oxygen Therapy: Patient Spontanous Breathing and Patient connected to face mask oxygen  Post-op Assessment: Report given to RN and Post -op Vital signs reviewed and stable  Post vital signs: Reviewed and stable  Last Vitals:  Vitals:   05/05/17 0632 05/05/17 1505  BP: (!) 159/94 128/69  Pulse: 72 (!) 108  Resp: 16 16  Temp: 36.6 C 36.6 C  SpO2: 100% 97%    Last Pain:  Vitals:   05/05/17 1505  TempSrc:   PainSc: Asleep         Complications: No apparent anesthesia complications

## 2017-05-05 NOTE — Op Note (Signed)
Operative Note   SURGERY DATE: 05/05/2017  PRE-OP DIAGNOSIS:  1. Right femoroacetabular impingement 2. Right hip labral tear  POST-OP DIAGNOSIS: 1. Right femoroacetabular impingement 2. Right hip labral tear 3. Right hip osteoarthritis  PROCEDURES:  1. Left hip arthroscopy with acetabuloplasty, labral repair, femoral osteochondroplasty, and capsular closure  SURGEON: Cato Mulligan, MD  ANESTHESIA: Gen  ESTIMATED BLOOD LOSS:minimal  TOTAL IV FLUIDS: per anesthesia  INDICATION(S): The patient is a 52 y.o. year old female who presents with persistent hip pain.  Radiographs and/or advanced imaging demonstrated FAI morphology and concern for labral tear. She has failed greater than 3 months of non-operative treatment to date including activity modifications, physical therapy, and corticosteroid injection.  Please see the preoperative notes for further detail.   She elected to undergo the above mentioned procedure after detailed explanation of the expected outcomes and recovery path.   Informed consent was obtained outlining the expected benefits and possible risks of the surgery including a less than 5% chance of numbness in the sciatic or pudendal nerve regions, 20% chance of injury to the lateral femoral cutaneous nerve (1% permanent injury). Other risks include continued pain due to preexisting chondromalacia and other general risks of surgery such as blood clots, infection and bleeding.  OPERATIVE FINDINGS: Cartilage Significant Grade 2 degenerative changes of the acetabular cartilage and femoral head diffusely High grade cartilage lesion: no Delamination: no Bone exposed: no Bruising: no Localization of femoral head high grade lesion: none  Cotyloid fossa osteophytes:  none  Labrum Labral degeneration, yellow over 50% of labrum: no Complexity of tearing:  Tear at chondrolabral junction Hypoplastic labrum: no Hyperplastic labrum:  no Lipstick sign at the psoas  prominence: none Psoas Prominence: no  Boundaries of labral tear Convention (3 o'clock anterior, 9 o'clock posterior) Anterior boundary: 1 o'clock Posterior boundary: 3 o'clock  Ligamentum teres Hypertrophy:  no Tear: no   OPERATIVE REPORT:  The patient was brought to the operating room, placed supine on the operating table, and bony prominences were padded.  The traction boots were applied with padding to ensure that safe traction could be applied through the feet.  The contralateral limb was abducted slightly and light traction was applied.  The operative leg was brought into neutral position.  Appropriate preoperative IV antibiotics were administered. The patient was prepped and draped in a sterile fashion.  Time-out was performed and landmarks were identified. An air arthrogram was obtained by injecting 30cc into the hip joint while traction was pulled. This broke the labral seal allowing for distraction of the hip. Care was taken to ensure the least amount of force necessary to allow safe access to the joint of 8-4mm.  This was checked with fluoroscopy.   Next we placed an anterolateral portal under the assistance of fluoroscopy.  First, fluoroscopy was used to estimate the trajectory and starting point.  A 3mm incision with a #11 blade was made and a straight hemostat was used to dilate the portal through the appropriate tract.  We then placed a 14-gauge hypodermic needle with careful technique to be as close to the femoral head as possible and parallel to the sourcil to ensure no iatrogenic damage to the labrum.  This released the negative pressure environment and the amount of traction was adjusted to maintain the 8-6mm of distraction.  A nitinol wire was placed through the needle and flouroscopy was used to ensure it extended to the medial wall of the acetabulum.  The Flowport from [place] was  placed over the wire and the nitinol wire was retracted to just inside the capsule  during insertion of the dilator and cannula to minimize the risk of breakage. The arthroscope was placed next and we visualized the anterior triangle.  We then placed the anterior portal under direct visualization using the technique described above.  This was safely placed as well without damage to the labrum or femoral head.  We then switched our arthroscope to the anterior portal to ensure we were not through the labrum - we were safely through the capsule only.  We then proceeded with a transverse capsulotomy connecting the 2 portals in the same plane utilizing the Samurai blade from [place].  The Injector device from [place] was used to place traction stitches each in the medial and lateral arms of the proximal capsule.  A Kelly clamp was used to hold the suture against the skin to apply traction. This allowed access to the acetabular rim and labrum as well as protection of the native edges of the capsule.  We identified the anterior inferior iliac spine proximally, the psoas tendon medially and the rectus tendon laterally as landmarks.  We then proceeded with a diagnostic arthroscopy - the results can be found in the findings section above.    We then used the 50 degree hip specific radiofrequency device and a 60mm shaver to clear the superior acetabulum and expose the subspinous region.  Next we exposed the acetabular rim leaving the chondral labral junction intact. There was a very prominent subspine with a cyst on it.  Working from both portals, the acetabular rim/subspinous region was reshaped with a 4.57mm diamond burr consistent with the preoperative three-dimensional imaging.   When adequate reshaping was confirmed with fluoroscopy, we then proceeded with the labral repair.  A distal anterolateral portal was placed under direct visualization and the Transport cannula was inserted.  Care was taken to ensure the cannula was in the intermuscular plane between the gluteus minimus and  iliocapsularis.  This portal was approximately 4cm distal and 1cm anterior to the anterolateral portal.     We placed 3 anchors at the 1 o'clock, 2 o'clock and 3 o'clock positions with a vertical mattress  stitch at each of the above positions respectively. The sutures were passed using the crescent Nanopass from [place].  This resulted in anatomic labral repair.  We debrided the loose cartilage at the rim and residual degenerative labral tissue.  Traction was let down with total traction time of 149 minutes.    We then turned our attention to the peripheral compartment.  We flexed the hip to 45 degrees. We viewed from the anterior portal and worked from the distal anterolateral portal.  First we passed two traction stitches in the medial and lateral sides of the capsulotomy.  In between these sutures, we performed a T capsulotomy with the samurai blade from [place] down to the intertrochanteric line in the plane between the iliocapsularis and gluteus minimus. The position of the T-capsulotomy was checked with fluoroscopy to ensure a safe position along the anterolateral neck.  Next we placed one additional traction stitch in the lateral limb of the T-capsulotomy.  Clamps were used to hold these stitches against the skin for retraction.  Adequate mobilization and retraction was achieved such that we could view the entire CAM lesion. The capsule was retracted with a switching stick through the AL portal when necessary.  We were able to view the medial and lateral synovial folds,  identifying the medial and lateral circumflex arteries.  These were protected during the osteoplasty.  A 5.74mm burr was used to reshape the femoral neck.  The initial line of resection was defined with the use of dynamic exam and fluoroscopy.  The line was parallel to the acetabular rim with the leg in neutral rotation.  We viewed the base of the femoral neck to provide a foundation for the shape and size of the  osteochondroplasty. We were able to adequately remove the cam lesion and reshape the femoral neck.  This was confirmed with fluoroscopy.  Dynamic exam showed no residual impingement flexed to 90 degrees with maximal internal rotation and external rotation.  Finally, we performed a complete capsular closure with Zipline suture from [place], utilizing the Slingshot from [place] to pass the suture.  For capsular closure, two figure of 8 stitches (medial interportal and T-capsulotomy) and two simple stitches (medial interportal and mid-interportal) were placed  These were then tied sequentially with alternating half hitches through an 8.45mm Transport cannula. Reapproximation of the capsule was confirmed.   We then removed the arthroscope and closed the incisions with 2-0 Vicryl subdermally and 3-0 nylon stitches.  4mg  morphine was injected into the joint and local anesthetic was injected about the portals and tracts down to the joint. A sterile dressing was applied and hip brace was applied.  The patient was awakened from anesthesia and transferred to PACU in stable condition.   POST-OPERATIVE PLAN: Postoperative care includes overnight stay, 3 weeks of touch down weight-bearing and continuous passive motion device for 6-8 hours per day.  The patient will require a brace for 3 weeks locked at 50 degrees while sleeping and open to 90 degrees of flexion at all other times.  Formal physical therapy will begin this week. ASA x 2 weeks for DVT ppx. Naproxen for HO ppx for 30 days.

## 2017-05-05 NOTE — Anesthesia Procedure Notes (Signed)
Procedure Name: Intubation Date/Time: 05/05/2017 7:47 AM Performed by: Leander Rams, CRNA Pre-anesthesia Checklist: Patient identified, Emergency Drugs available, Suction available, Patient being monitored and Timeout performed Patient Re-evaluated:Patient Re-evaluated prior to induction Oxygen Delivery Method: Circle system utilized Preoxygenation: Pre-oxygenation with 100% oxygen Induction Type: IV induction Ventilation: Mask ventilation without difficulty Laryngoscope Size: Mac and 3 Grade View: Grade I Tube type: Oral Tube size: 7.0 mm Number of attempts: 1 Airway Equipment and Method: Stylet Placement Confirmation: ETT inserted through vocal cords under direct vision,  positive ETCO2 and breath sounds checked- equal and bilateral Secured at: 21 cm Tube secured with: Tape Dental Injury: Teeth and Oropharynx as per pre-operative assessment

## 2017-05-05 NOTE — H&P (Signed)
Paper H&P to be scanned into permanent record. H&P reviewed. Regular rate and rhythm. Chest sounds clear.   No significant changes noted.

## 2017-05-06 DIAGNOSIS — M160 Bilateral primary osteoarthritis of hip: Secondary | ICD-10-CM | POA: Diagnosis not present

## 2017-05-06 MED ORDER — OXYCODONE HCL 5 MG PO TABS: 5.0000 mg | ORAL_TABLET | ORAL | 0 refills | Status: DC | PRN

## 2017-05-06 MED ORDER — ASPIRIN 325 MG PO TABS
325.0000 mg | ORAL_TABLET | Freq: Once | ORAL | Status: DC
  Filled 2017-05-06: qty 1

## 2017-05-06 MED ORDER — TIZANIDINE HCL 4 MG PO TABS: 4.0000 mg | ORAL_TABLET | Freq: Four times a day (QID) | ORAL | 1 refills | Status: DC | PRN

## 2017-05-06 MED ORDER — ONDANSETRON HCL 4 MG PO TABS: 4.0000 mg | ORAL_TABLET | Freq: Four times a day (QID) | ORAL | 1 refills | Status: DC | PRN

## 2017-05-06 MED ORDER — NAPROXEN 500 MG PO TABS: 500.0000 mg | ORAL_TABLET | Freq: Two times a day (BID) | ORAL | 0 refills | Status: DC

## 2017-05-06 MED ORDER — ASPIRIN 325 MG PO TBEC: 325.0000 mg | DELAYED_RELEASE_TABLET | Freq: Every day | ORAL | 0 refills | Status: DC

## 2017-05-06 NOTE — Evaluation (Signed)
Occupational Therapy Evaluation Patient Details Name: Paula Moses MRN: [id number] DOB: 1966/03/06 Today's Date: 05/06/2017    History of Present Illness Patient is a 52 year old female admitted for arthroscopic surgery due to right femoacetabular impingement.   Clinical Impression   Patient was lying supine in bed with husband at bedside when OT arrived. Patient was eager to participate, but was awaiting pain medication. Current pain level was 7/10. Patient lives with husband and 4 teenage children in a multi-story home. Patient and husband were educated in weight bearing status and hip position/brace. Both were aware and verbalized understanding. Patient performed bed mobility using elevated HOB for supine to sit. Min A for managing RLE. Husband states they have an adjustable bed without railings at home, and was educated on how to use bed to assist with bed mobility. Patient was able to sit edge of bed, with some dizziness initially. This subsided once sitting for a few minutes. Patient had difficulty sitting fully upright without using BUE to support self due to positioning of brace. Brace is in correct position, but limits patient hip flexion so will need back support for use of BUE for ADL tasks. Patient and husband educated on safety and compensatory techniques, including using a chair with back support for unsupported sitting. Both verbalized understanding. Educated on bathroom compensatory techniques and DME for performance of ADL tasks (toileting and bathing). Husband states teenage daughter or he will be with patient 24 hours/day to provide any needed assistance. Educated patient on clothing options to allow management of toileting easily, while providing more comfort with hip brace. Patient and husband verbalized understanding of all education. Patient would benefit from continued OT services while in the hospital to assist with education in preparation for discharge. No OT needs upon  discharge. PT consult for additional recommended discharge needs.    Follow Up Recommendations  No OT follow up    Equipment Recommendations  Tub/shower seat    Recommendations for Other Services PT consult     Precautions / Restrictions Precautions Required Braces or Orthoses: Other Brace/Splint Other Brace/Splint: right hip brace Restrictions Weight Bearing Restrictions: Yes RLE Weight Bearing: Partial weight bearing RLE Partial Weight Bearing Percentage or Pounds: 50% Other Position/Activity Restrictions: wear right hip brace at all times      Mobility Bed Mobility Overal bed mobility: Needs Assistance Bed Mobility: Sit to Supine;Supine to Sit     Supine to sit: Min assist;HOB elevated Sit to supine: Min assist   General bed mobility comments: Assist for managing RLE. Husbanded and patient educated in compensatory techniques and support to assist with bed mobility.  Transfers                      Balance                                           ADL either performed or assessed with clinical judgement   ADL Overall ADL's : Needs assistance/impaired Eating/Feeding: Independent   Grooming: Set up;Bed level               Lower Body Dressing: Maximal assistance;With caregiver independent assisting Lower Body Dressing Details (indicate cue type and reason): Difficulty sitting EOB without UE support due to positioning of hip brace. Educated on compensatory techniques of sitting in chair for sitting support. Toilet Transfer: Minimal assistance  Functional mobility during ADLs: Caregiver able to provide necessary level of assistance;Minimal assistance General ADL Comments: limited by pain, hip brace, and weight bearing status. Able to participate in problem solving of compensarory techniques. Husband supportive and helpful.     Vision Baseline Vision/History: No visual deficits Patient Visual Report: No change from  baseline       Perception     Praxis      Pertinent Vitals/Pain Pain Assessment: 0-10 Pain Score: 7  Pain Descriptors / Indicators: Constant;Operative site guarding Pain Intervention(s): Limited activity within patient's tolerance;Repositioned;Monitored during session;RN gave pain meds during session     Hand Dominance Right   Extremity/Trunk Assessment Upper Extremity Assessment Upper Extremity Assessment: Overall WFL for tasks assessed   Lower Extremity Assessment Lower Extremity Assessment: Defer to PT evaluation   Cervical / Trunk Assessment Cervical / Trunk Assessment: Normal   Communication Communication Communication: No difficulties   Cognition Arousal/Alertness: Awake/alert Behavior During Therapy: WFL for tasks assessed/performed Overall Cognitive Status: Within Functional Limits for tasks assessed                                     General Comments  Difficulty sitting EOB without UE support due to positioning of hip brace. Educated on use of chairs with back support to assist with positioning for use of BUE during ADL tasks.    Exercises     Shoulder Instructions      Home Living Family/patient expects to be discharged to:: Private residence Living Arrangements: Spouse/significant other;Children Available Help at Discharge: Family Type of Home: House Home Access: Stairs to enter Technical brewer of Steps: 4-5 Entrance Stairs-Rails: Left Home Layout: Multi-level Alternate Level Stairs-Number of Steps: 15 steps with landing half way Alternate Level Stairs-Rails: Left Bathroom Shower/Tub: Walk-in shower;Door   [employer] Toilet: Programmer, systems: Yes   Home Equipment: Crutches   Additional Comments: husband states he is going to put in hand held shower head, and something for her to sit on (states plenty of room in the shower for a chair)      Prior Functioning/Environment Level of Independence: Independent                  OT Problem List: Impaired balance (sitting and/or standing);Decreased knowledge of use of DME or AE      OT Treatment/Interventions: Self-care/ADL training;DME and/or AE instruction;Patient/family education    OT Goals(Current goals can be found in the care plan section) Acute Rehab OT Goals Patient Stated Goal: Go home OT Goal Formulation: With patient/family Time For Goal Achievement: 05/20/17 Potential to Achieve Goals: Good  OT Frequency: Min 2X/week   Barriers to D/C: Inaccessible home environment  multiple stairs to enter home, and get to bedroom in home. PT to work on stair training in their session after OT.       Co-evaluation              AM-PAC PT "6 Clicks" Daily Activity     Outcome Measure Help from another person eating meals?: None Help from another person taking care of personal grooming?: A Little Help from another person toileting, which includes using toliet, bedpan, or urinal?: A Little Help from another person bathing (including washing, rinsing, drying)?: A Lot Help from another person to put on and taking off regular upper body clothing?: A Little Help from another person to put on and taking off regular lower body clothing?:  A Lot 6 Click Score: 17   End of Session Equipment Utilized During Treatment: Other (comment)(right hip brace) Nurse Communication: Mobility status;Patient requests pain meds  Activity Tolerance: Patient tolerated treatment well;Patient limited by pain Patient left: in bed;with call bell/phone within reach;with nursing/sitter in room;with family/visitor present  OT Visit Diagnosis: Unsteadiness on feet (R26.81);Pain Pain - Right/Left: Right Pain - part of body: Hip                Time: 1000-1035 OT Time Calculation (min): 35 min Charges:  OT General Charges $OT Visit: 1 Visit OT Evaluation $OT Eval Low Complexity: 1 Low OT Treatments $Self Care/Home Management : 23-37 mins G-Codes:     Amie [place], OTR/L  Maki Sweetser L 05/06/2017, 11:07 AM

## 2017-05-06 NOTE — Progress Notes (Signed)
Patient is being discharged home with out patient PT. DC & RX instructions given and patient acknowledged understanding. RX given to husband and he left to have them filled prior to patient's discharge. Belongings packed and husband here to drive patient home.

## 2017-05-06 NOTE — Care Management Note (Signed)
Case Management Note  Patient Details  Name: Paula Moses MRN: [id number] Date of Birth: 09/09/1965  Subjective/Objective:      Discussed Discharge planning with [name]. She already has crutches and a RW. Awaiting a PT evaluation.              Action/Plan:   Expected Discharge Date:  05/06/17               Expected Discharge Plan:     In-House Referral:     Discharge planning Services     Post Acute Care Choice:    Choice offered to:     DME Arranged:    DME Agency:     HH Arranged:    HH Agency:     Status of Service:     If discussed at [employer], dates discussed:    Additional Comments:  Barrett Goldie A, RN 05/06/2017, 10:37 AM

## 2017-05-06 NOTE — Evaluation (Signed)
Physical Therapy Evaluation Patient Details Name: Paula Moses MRN: [id number] DOB: 1965-10-21 Today's Date: 05/06/2017   History of Present Illness  Patient is a 52 year old female admitted for arthroscopic surgery due to right femoacetabular impingement.  Clinical Impression  Upon entering room pt is laying in bed with family present. She is A&O x 4 and currently report 3/10 pain. She requires min assist with bed mobility and sit to stand transfers. She ambulated with crutches utilizing toe touch on RLE to maintain precautions of PWB. She was able to navigate 8 steps with crutch and railling. Pt was returned back to room via W/C due to fatigue and was able to perform exercises per handout. Nurse was notified post session. She would benefit from continued physical therapy to improve hip/ knee strength and mobility, as well as appropriate use of DME for safety. Pt plans to return home following discharged and would benefit from continued physical therapy via outpatient PT.    Follow Up Recommendations Outpatient PT    Equipment Recommendations  None recommended by PT(pt has crutches)    Recommendations for Other Services       Precautions / Restrictions Precautions Required Braces or Orthoses: Other Brace/Splint Other Brace/Splint: right hip brace Restrictions Weight Bearing Restrictions: Yes RLE Weight Bearing: Partial weight bearing RLE Partial Weight Bearing Percentage or Pounds: 50% Other Position/Activity Restrictions: wear right hip brace at all times      Mobility  Bed Mobility Overal bed mobility: Needs Assistance Bed Mobility: Sit to Supine;Supine to Sit     Supine to sit: Min assist;HOB elevated Sit to supine: Min assist   General bed mobility comments: Assist for managing RLE. Husbanded and patient educated in compensatory techniques and support to assist with bed mobility.  Transfers Overall transfer level: Needs assistance Equipment used: Crutches Transfers:  Sit to/from Stand Sit to Stand: Min assist(with verbal cues regarding precuations)         General transfer comment: pt able to perform general   Ambulation/Gait Ambulation/Gait assistance: Min assist Ambulation Distance (Feet): 100 Feet Assistive device: Crutches Gait Pattern/deviations: Step-to pattern(cues for toe touch only with RLE)     General Gait Details: required verbal cues/ appropriate fit with crutches to avoid taking too big of steps and keep crutches with RLE for support.   Stairs Stairs: Yes Stairs assistance: Min assist Stair Management: One rail Right;With crutches Number of Stairs: 8 General stair comments: verbal cues for proper form going up with LLE, and down leading with RLE using railing and crutches  Wheelchair Mobility    Modified Rankin (Stroke Patients Only)       Balance                                             Pertinent Vitals/Pain Pain Assessment: 0-10 Pain Score: 3  Pain Location: R anterior hip Pain Descriptors / Indicators: Constant;Operative site guarding Pain Intervention(s): Monitored during session;Limited activity within patient's tolerance    Home Living Family/patient expects to be discharged to:: Private residence Living Arrangements: Spouse/significant other;Children Available Help at Discharge: Family Type of Home: House Home Access: Stairs to enter Entrance Stairs-Rails: Left Entrance Stairs-Number of Steps: 4-5 Home Layout: Multi-level Home Equipment: Crutches Additional Comments: husband states he is going to put in hand held shower head, and something for her to sit on (states plenty of room in the shower  for a chair)    Prior Function Level of Independence: Independent               Hand Dominance   Dominant Hand: Right    Extremity/Trunk Assessment   Upper Extremity Assessment Upper Extremity Assessment: Overall WFL for tasks assessed    Lower Extremity Assessment Lower  Extremity Assessment: RLE deficits/detail;LLE deficits/detail RLE Deficits / Details: knee 4-/5, r hip not tested RLE: Unable to fully assess due to pain LLE Deficits / Details: Lhip/ knee 4+/5     Cervical / Trunk Assessment Cervical / Trunk Assessment: Normal  Communication   Communication: No difficulties  Cognition Arousal/Alertness: Awake/alert Behavior During Therapy: WFL for tasks assessed/performed Overall Cognitive Status: Within Functional Limits for tasks assessed                                        General Comments General comments (skin integrity, edema, etc.): Difficulty sitting EOB without UE support due to positioning of hip brace. Educated on use of chairs with back support to assist with positioning for use of BUE during ADL tasks.    Exercises Other Exercises Other Exercises: quad set 1 x 10 holding 5 sec, glute set 1 x 10 holding 5 sec, ankle pumps bil 2 x 20(provided handout)   Assessment/Plan    PT Assessment Patient needs continued PT services  PT Problem List Decreased strength;Decreased range of motion;Decreased activity tolerance;Decreased balance;Decreased mobility;Decreased knowledge of use of DME       PT Treatment Interventions      PT Goals (Current goals can be found in the Care Plan section)  Acute Rehab PT Goals Patient Stated Goal: Go home PT Goal Formulation: With patient/family Time For Goal Achievement: 05/20/17 Potential to Achieve Goals: Good    Frequency     Barriers to discharge        Co-evaluation               AM-PAC PT "6 Clicks" Daily Activity  Outcome Measure Difficulty turning over in bed (including adjusting bedclothes, sheets and blankets)?: A Lot Difficulty moving from lying on back to sitting on the side of the bed? : A Lot Difficulty sitting down on and standing up from a chair with arms (e.g., wheelchair, bedside commode, etc,.)?: A Lot Help needed moving to and from a bed to chair  (including a wheelchair)?: A Little Help needed walking in hospital room?: A Little Help needed climbing 3-5 steps with a railing? : A Little 6 Click Score: 15    End of Session         PT Visit Diagnosis: Unsteadiness on feet (R26.81);Muscle weakness (generalized) (M62.81)    Time: 5277-8242 PT Time Calculation (min) (ACUTE ONLY): 46 min   Charges:   PT Evaluation $PT Eval Low Complexity: 1 Low PT Treatments $Gait Training: 8-22 mins $Therapeutic Exercise: 8-22 mins $Therapeutic Activity: 8-22 mins   PT G Codes:        Hendrik Donath PT, DPT, LAT, ATC  05/06/17  12:50 PM       Starr [place] 05/06/2017, 12:46 PM

## 2017-05-06 NOTE — Discharge Summary (Signed)
Physician Discharge Summary  Patient ID: Paula Moses MRN: [id number] DOB/AGE: 12/10/1965 52 y.o.  Admit date: 05/05/2017 Discharge date: 05/06/2017  Admission Diagnoses:  right femoracetabular impingement  Discharge Diagnoses: Patient Active Problem List   Diagnosis Date Noted  . Femoroacetabular impingement 05/05/2017  . Surgical menopause 01/26/2017  . Status post vaginal hysterectomy BSO 01/26/2017  . Hypertension   . High cholesterol   . Fibroids   . Trigeminal neuralgia   . Left-sided face pain   . Neck pain on left side 07/16/2013  . Left facial pain 07/16/2013  . Paresthesia 07/16/2013  Right femoroacetabular impingement  Past Medical History:  Diagnosis Date  . Abnormal Pap smear of cervix   . Abnormal vaginal bleeding   . Complication of anesthesia    IT TAKES ALOT OF ANESTHESIA TO KEEP PT SEDATED  . Cyst of vulva   . Dyspareunia   . External hemorrhoid   . Fibroids   . GERD (gastroesophageal reflux disease)   . High cholesterol   . History of UTI   . Left-sided face pain   . Neck pain   . Paresthesia   . Rectocele   . Surgical menopause   . Trigeminal neuralgia   . Uterus prolapse    Transfusion: None.   Consultants (if any):   Discharged Condition: Improved  Hospital Course: Paula Moses is an 52 y.o. female who was admitted 05/05/2017 with a diagnosis of right femoroacetabular impingement and went to the operating room on 05/05/2017 and underwent the above named procedures.    Surgeries: Procedure(s): ARTHROSCOPY HIP on 05/05/2017 Patient tolerated the surgery well. Taken to PACU where she was stabilized and then transferred to the orthopedic floor.  Started on Aspirin EC 325 daily. Foot pumps applied bilaterally at 80 mm. Heels elevated on bed with rolled towels. No evidence of DVT. Negative Homan. Physical therapy started on day #1 for gait training and transfer. OT started day #1 for ADL and assisted devices.  Patient's IV and foley were  removed on POD1.  Implants: None.  She was given perioperative antibiotics:  Anti-infectives (From admission, onward)   Start     Dose/Rate Route Frequency Ordered Stop   05/05/17 1800  ceFAZolin (ANCEF) IVPB 2g/100 mL premix     2 g 200 mL/hr over 30 Minutes Intravenous Every 6 hours 05/05/17 1708 05/06/17 0703   05/05/17 0644  ceFAZolin (ANCEF) 2-4 GM/100ML-% IVPB    Comments:  Riv[place]   : cabinet override      05/05/17 0644 05/05/17 0751   05/04/17 2330  ceFAZolin (ANCEF) IVPB 2g/100 mL premix     2 g 200 mL/hr over 30 Minutes Intravenous  Once 05/04/17 2325 05/05/17 1151    .  She was given sequential compression devices, early ambulation, and Aspirin for DVT prophylaxis.  She benefited maximally from the hospital stay and there were no complications.    Recent vital signs:  Vitals:   05/06/17 0328 05/06/17 0727  BP: 122/64 121/69  Pulse: 81 70  Resp: 19 18  Temp: 98.3 F (36.8 C) 98.2 F (36.8 C)  SpO2: 96% 97%    Recent laboratory studies:  Lab Results  Component Value Date   HGB 8.6 (L) 11/26/2013   HGB 11.4 (L) 11/19/2013   HGB 12.1 08/06/2013   Lab Results  Component Value Date   WBC 6.8 11/19/2013   PLT 247 11/19/2013   No results found for: INR Lab Results  Component Value Date   NA  136 11/19/2013   K 3.7 11/19/2013   CL 100 11/19/2013   CO2 29 11/19/2013   BUN 13 11/19/2013   CREATININE 1.01 11/19/2013   GLUCOSE 93 11/19/2013    Discharge Medications:   Allergies as of 05/06/2017      Reactions   Aspirin Shortness Of Breath   congestion   Codeine Itching   Facial itching    Percocet [oxycodone-acetaminophen]    Facial itching    Phenergan [promethazine Hcl]    Facial itching       Medication List    TAKE these medications   aspirin 325 MG EC tablet Take 1 tablet (325 mg total) by mouth daily. Start taking on:  05/07/2017   Biotin 2500 MCG Caps Take 5,000 mcg by mouth every evening.   estradiol 2 MG tablet Commonly  known as:  ESTRACE Take 2 mg by mouth every evening.   meloxicam 15 MG tablet Commonly known as:  MOBIC Take 15 mg by mouth at bedtime as needed for pain.   montelukast 10 MG tablet Commonly known as:  SINGULAIR Take 10 mg by mouth at bedtime.   naproxen 500 MG tablet Commonly known as:  NAPROSYN Take 1 tablet (500 mg total) by mouth 2 (two) times daily with a meal.   omeprazole 20 MG tablet Commonly known as:  PRILOSEC OTC Take 20 mg by mouth daily as needed (acid reflux).   ondansetron 4 MG tablet Commonly known as:  ZOFRAN Take 1 tablet (4 mg total) by mouth every 6 (six) hours as needed for nausea.   OVER THE COUNTER MEDICATION Take 1 Dose by mouth daily as needed (pain). cbd oil   oxyCODONE 5 MG immediate release tablet Commonly known as:  Oxy IR/ROXICODONE Take 1-2 tablets (5-10 mg total) by mouth every 4 (four) hours as needed for severe pain.   phentermine 37.5 MG tablet Commonly known as:  ADIPEX-P Take 37.5 mg daily before breakfast by mouth.   tiZANidine 4 MG tablet Commonly known as:  ZANAFLEX Take 1-2 tablets (4-8 mg total) by mouth every 6 (six) hours as needed for muscle spasms.      Diagnostic Studies: Dg Hip Operative Unilat W Or W/o Pelvis Right  Result Date: 05/05/2017 CLINICAL DATA:  Right hip surgery EXAM: OPERATIVE right HIP (WITH PELVIS IF PERFORMED) multiple VIEWS TECHNIQUE: Fluoroscopic spot image(s) were submitted for interpretation post-operatively. COMPARISON:  MRI 03/27/2017 FINDINGS: Multiple C-arm images were obtained of the right hip during arthroscopy. Surgical instruments are present overlying the hip joint. IMPRESSION: Right hip arthroscopy. Electronically Signed   By: Franchot Gallo M.D.   On: 05/05/2017 15:06   Disposition: Plan will be for discharge home today following PT.  Follow-up Information    Leim Fabry, MD Follow up in 14 day(s).   Specialty:  Orthopedic Surgery Contact information: [place]   [id number] [phone]          Signed: Judson Roch PA-C 05/06/2017, 9:33 AM

## 2017-05-06 NOTE — Progress Notes (Signed)
  Subjective: 1 Day Post-Op Procedure(s) (LRB): ARTHROSCOPY HIP (Right) Patient reports pain as mild.   Patient is well, and has had no acute complaints or problems Plan is to go Home after hospital stay. Negative for chest pain and shortness of breath Fever: no Gastrointestinal:Negative for nausea and vomiting  Objective: Vital signs in last 24 hours: Temp:  [97.5 F (36.4 C)-98.3 F (36.8 C)] 98.2 F (36.8 C) (02/23 0727) Pulse Rate:  [70-110] 70 (02/23 0727) Resp:  [11-31] 18 (02/23 0727) BP: (121-180)/(64-107) 121/69 (02/23 0727) SpO2:  [92 %-100 %] 97 % (02/23 0727) Weight:  [79.8 kg (175 lb 16 oz)] 79.8 kg (175 lb 16 oz) (02/22 1833)  Intake/Output from previous day:  Intake/Output Summary (Last 24 hours) at 05/06/2017 0922 Last data filed at 05/06/2017 0816 Gross per 24 hour  Intake 4641.25 ml  Output 3730 ml  Net 911.25 ml    Intake/Output this shift: Total I/O In: -  Out: 300 [Urine:300]  Labs: No results for input(s): HGB in the last 72 hours. No results for input(s): WBC, RBC, HCT, PLT in the last 72 hours. No results for input(s): NA, K, CL, CO2, BUN, CREATININE, GLUCOSE, CALCIUM in the last 72 hours. No results for input(s): LABPT, INR in the last 72 hours.   EXAM General - Patient is Alert, Appropriate and Oriented Extremity - ABD soft Sensation intact distally Intact pulses distally Incision: dressing C/D/I No cellulitis present Compartment soft  Abdomen is soft with normal BS. Dressing/Incision - clean, dry, no drainage Motor Function - intact, moving foot and toes well on exam.  Past Medical History:  Diagnosis Date  . Abnormal Pap smear of cervix   . Abnormal vaginal bleeding   . Complication of anesthesia    IT TAKES ALOT OF ANESTHESIA TO KEEP PT SEDATED  . Cyst of vulva   . Dyspareunia   . External hemorrhoid   . Fibroids   . GERD (gastroesophageal reflux disease)   . High cholesterol   . History of UTI   . Left-sided face pain    . Neck pain   . Paresthesia   . Rectocele   . Surgical menopause   . Trigeminal neuralgia   . Uterus prolapse     Assessment/Plan: 1 Day Post-Op Procedure(s) (LRB): ARTHROSCOPY HIP (Right) Active Problems:   Femoroacetabular impingement  Estimated body mass index is 30.21 kg/m as calculated from the following:   Height as of this encounter: 5\' 4"  (1.626 m).   Weight as of this encounter: 79.8 kg (175 lb 16 oz). Advance diet Up with therapy  Discharge home today.  Pt doing well this AM. Plan will be for discharge home following session with PT today. Plan will be for discharge home on aspirin, patient reports allergy however has had NSAIDs without issue.  DVT Prophylaxis - Aspirin and TED hose 50% weightbearing to the right leg, flat foot weightbearing.  Raquel Davonta Stroot, PA-C [facility] 05/06/2017, 9:22 AM

## 2017-05-08 ENCOUNTER — Encounter: Payer: Self-pay | Admitting: Orthopedic Surgery

## 2017-05-08 LAB — HIV ANTIBODY (ROUTINE TESTING W REFLEX): HIV SCREEN 4TH GENERATION: NONREACTIVE

## 2017-05-26 ENCOUNTER — Other Ambulatory Visit: Payer: Self-pay | Admitting: Orthopedic Surgery

## 2017-05-26 ENCOUNTER — Ambulatory Visit
Admission: RE | Admit: 2017-05-26 | Discharge: 2017-05-26 | Disposition: A | Discharge status: Home / Self Care | Payer: BLUE CROSS/BLUE SHIELD | Source: Ambulatory Visit | Attending: Orthopedic Surgery | Admitting: Orthopedic Surgery

## 2017-05-26 DIAGNOSIS — M7989 Other specified soft tissue disorders: Secondary | ICD-10-CM | POA: Diagnosis not present

## 2017-10-25 ENCOUNTER — Other Ambulatory Visit: Payer: Self-pay

## 2017-10-25 ENCOUNTER — Encounter
Admission: RE | Admit: 2017-10-25 | Discharge: 2017-10-25 | Disposition: A | Discharge status: Home / Self Care | Payer: BLUE CROSS/BLUE SHIELD | Source: Ambulatory Visit | Attending: Orthopedic Surgery | Admitting: Orthopedic Surgery

## 2017-10-25 HISTORY — DX: Headache: R51

## 2017-10-25 LAB — CBC
HEMATOCRIT: 37.2 % (ref 35.0–47.0)
HEMOGLOBIN: 13.3 g/dL (ref 12.0–16.0)
MCH: 31.2 pg (ref 26.0–34.0)
MCHC: 35.8 g/dL (ref 32.0–36.0)
MCV: 87.2 fL (ref 80.0–100.0)
Platelets: 253 10*3/uL (ref 150–440)
RBC: 4.26 MIL/uL (ref 3.80–5.20)
RDW: 13.2 % (ref 11.5–14.5)
WBC: 10.1 10*3/uL (ref 3.6–11.0)

## 2017-10-25 LAB — URINALYSIS, ROUTINE W REFLEX MICROSCOPIC
Bilirubin Urine: NEGATIVE
Glucose, UA: NEGATIVE mg/dL
Hgb urine dipstick: NEGATIVE
KETONES UR: NEGATIVE mg/dL
LEUKOCYTES UA: NEGATIVE
NITRITE: NEGATIVE
PROTEIN: NEGATIVE mg/dL
Specific Gravity, Urine: 1.015 (ref 1.005–1.030)
pH: 6 (ref 5.0–8.0)

## 2017-10-25 LAB — TYPE AND SCREEN
ABO/RH(D): A POS
Antibody Screen: NEGATIVE

## 2017-10-25 LAB — SURGICAL PCR SCREEN
MRSA, PCR: NEGATIVE
STAPHYLOCOCCUS AUREUS: NEGATIVE

## 2017-10-25 LAB — BASIC METABOLIC PANEL
Anion gap: 9 (ref 5–15)
BUN: 12 mg/dL (ref 6–20)
CHLORIDE: 102 mmol/L (ref 98–111)
CO2: 26 mmol/L (ref 22–32)
Calcium: 9.1 mg/dL (ref 8.9–10.3)
Creatinine, Ser: 0.79 mg/dL (ref 0.44–1.00)
GFR calc Af Amer: >60 mL/min (ref >60)
GLUCOSE: 102 mg/dL — AB (ref 70–99)
POTASSIUM: 3.7 mmol/L (ref 3.5–5.1)
SODIUM: 137 mmol/L (ref 135–145)

## 2017-10-25 LAB — PROTIME-INR
INR: 0.96
PROTHROMBIN TIME: 12.7 s (ref 11.4–15.2)

## 2017-10-25 LAB — SEDIMENTATION RATE: SED RATE: 36 mm/h — AB (ref 0–20)

## 2017-10-25 LAB — APTT: aPTT: 26 seconds (ref 24–36)

## 2017-10-25 MED ORDER — CEFAZOLIN SODIUM-DEXTROSE 2-4 GM/100ML-% IV SOLN
2.0000 g | Freq: Once | INTRAVENOUS | Status: AC
  Administered 2017-10-26: 2 g via INTRAVENOUS

## 2017-10-25 NOTE — Patient Instructions (Signed)
Your procedure is scheduled on: October 26, 2017 Thursday  Report to Day Surgery on the 2nd floor of the [facility] AT 6:00 AM   REMEMBER: Instructions that are not followed completely may result in serious medical risk, up to and including death; or upon the discretion of your surgeon and anesthesiologist your surgery may need to be rescheduled.  Do not eat food after midnight the night before surgery.  No gum chewing, lozengers or hard candies.  You may however, drink CLEAR liquids up to 2 hours before you are scheduled to arrive for your surgery. Do not drink anything within 2 hours of the start of your surgery.  Clear liquids include: - water  - apple juice without pulp - gatorade - black coffee or tea (Do NOT add milk or creamers to the coffee or tea) Do NOT drink anything that is not on this list.  Type 1 and Type 2 diabetics should only drink water.  No Alcohol for 24 hours before or after surgery.  No Smoking including e-cigarettes for 24 hours prior to surgery.  No chewable tobacco products for at least 6 hours prior to surgery.  No nicotine patches on the day of surgery.  On the morning of surgery brush your teeth with toothpaste and water, you may rinse your mouth with mouthwash if you wish. Do not swallow any toothpaste or mouthwash.  Notify your doctor if there is any change in your medical condition (cold, fever, infection).  Do not wear jewelry, make-up, hairpins, clips or nail polish.  Do not wear lotions, powders, or perfumes. You may wear deodorant.  Do not shave 48 hours prior to surgery. Men may shave face and neck.  Contacts and dentures may not be worn into surgery.  Do not bring valuables to the hospital, including drivers license, insurance or credit cards.  [facility] is not responsible for any belongings or valuables.   TAKE THESE MEDICATIONS THE MORNING OF SURGERY: AMLODIPINE OMEPRAZOLE TAKE A DOSE THE NIGHT BEFORE SURGERY AND DAY OF SURGERY    Use CHG Soap as directed on instruction sheet.  Stop Anti-inflammatories (NSAIDS) such as Advil, Aleve, Ibuprofen, Motrin, Naproxen, Naprosyn and Aspirin based products such as Excedrin, Goodys Powder, BC Powder. (May take Tylenol or Acetaminophen if needed.)  Stop ANY OVER THE COUNTER supplements until after surgery. (May continue Vitamin D, Vitamin B, and multivitamin.)  Wear comfortable clothing (specific to your surgery type) to the hospital.  Plan for stool softeners for home use.  If you are being admitted to the hospital overnight, leave your suitcase in the car. After surgery it may be brought to your room.  If you are being discharged the day of surgery, you will not be allowed to drive home. You will need a responsible adult to drive you home and stay with you that night.   If you are taking public transportation, you will need to have a responsible adult with you. Please confirm with your physician that it is acceptable to use public transportation.   Please call [phone] if you have any questions about these instructions.

## 2017-10-25 NOTE — Pre-Procedure Instructions (Signed)
EKG faxed to Dr Carrie Mew office

## 2017-10-26 ENCOUNTER — Inpatient Hospital Stay: Payer: BLUE CROSS/BLUE SHIELD | Admitting: Anesthesiology

## 2017-10-26 ENCOUNTER — Inpatient Hospital Stay
Admission: RE | Admit: 2017-10-26 | Discharge: 2017-10-29 | DRG: 470 | Disposition: A | Discharge status: Home Health | Payer: BLUE CROSS/BLUE SHIELD | Source: Ambulatory Visit | Attending: Orthopedic Surgery | Admitting: Orthopedic Surgery

## 2017-10-26 ENCOUNTER — Encounter: Payer: Self-pay | Admitting: *Deleted

## 2017-10-26 ENCOUNTER — Encounter
Admission: RE | Disposition: A | Discharge status: Home Health | Payer: Self-pay | Source: Ambulatory Visit | Attending: Orthopedic Surgery

## 2017-10-26 ENCOUNTER — Inpatient Hospital Stay: Payer: BLUE CROSS/BLUE SHIELD

## 2017-10-26 DIAGNOSIS — G8918 Other acute postprocedural pain: Secondary | ICD-10-CM

## 2017-10-26 DIAGNOSIS — E7800 Pure hypercholesterolemia, unspecified: Secondary | ICD-10-CM | POA: Diagnosis present

## 2017-10-26 DIAGNOSIS — I10 Essential (primary) hypertension: Secondary | ICD-10-CM | POA: Diagnosis present

## 2017-10-26 DIAGNOSIS — Z419 Encounter for procedure for purposes other than remedying health state, unspecified: Secondary | ICD-10-CM

## 2017-10-26 DIAGNOSIS — M1611 Unilateral primary osteoarthritis, right hip: Secondary | ICD-10-CM | POA: Diagnosis present

## 2017-10-26 DIAGNOSIS — F419 Anxiety disorder, unspecified: Secondary | ICD-10-CM | POA: Diagnosis present

## 2017-10-26 DIAGNOSIS — Z90710 Acquired absence of both cervix and uterus: Secondary | ICD-10-CM | POA: Diagnosis not present

## 2017-10-26 DIAGNOSIS — Z87440 Personal history of urinary (tract) infections: Secondary | ICD-10-CM

## 2017-10-26 DIAGNOSIS — K219 Gastro-esophageal reflux disease without esophagitis: Secondary | ICD-10-CM | POA: Diagnosis present

## 2017-10-26 DIAGNOSIS — Z96641 Presence of right artificial hip joint: Secondary | ICD-10-CM

## 2017-10-26 DIAGNOSIS — G500 Trigeminal neuralgia: Secondary | ICD-10-CM | POA: Diagnosis present

## 2017-10-26 HISTORY — DX: Unspecified osteoarthritis, unspecified site: M19.90

## 2017-10-26 HISTORY — PX: TOTAL HIP ARTHROPLASTY: SHX124

## 2017-10-26 HISTORY — DX: Anxiety disorder, unspecified: F41.9

## 2017-10-26 LAB — URINE CULTURE: Culture: NO GROWTH

## 2017-10-26 LAB — ABO/RH: ABO/RH(D): A POS

## 2017-10-26 SURGERY — ARTHROPLASTY, HIP, TOTAL, ANTERIOR APPROACH
Anesthesia: Spinal | Laterality: Right

## 2017-10-26 MED ORDER — DIPHENHYDRAMINE HCL 12.5 MG/5ML PO ELIX
12.5000 mg | ORAL_SOLUTION | ORAL | Status: DC | PRN
Start: 2017-10-26 — End: 2017-10-29
  Administered 2017-10-27 – 2017-10-28 (×3): 12.5 mg via ORAL
  Filled 2017-10-26 (×2): qty 5
  Filled 2017-10-26: qty 10

## 2017-10-26 MED ORDER — HYDRALAZINE HCL 20 MG/ML IJ SOLN
10.0000 mg | INTRAMUSCULAR | Status: DC | PRN
  Administered 2017-10-26: 10 mg via INTRAVENOUS
  Filled 2017-10-26: qty 1

## 2017-10-26 MED ORDER — PROPOFOL 500 MG/50ML IV EMUL
INTRAVENOUS | Status: AC
  Filled 2017-10-26: qty 50

## 2017-10-26 MED ORDER — TRAMADOL HCL 50 MG PO TABS
50.0000 mg | ORAL_TABLET | Freq: Four times a day (QID) | ORAL | Status: DC
  Administered 2017-10-26 – 2017-10-29 (×11): 50 mg via ORAL
  Filled 2017-10-26 (×12): qty 1

## 2017-10-26 MED ORDER — MIDAZOLAM HCL 2 MG/2ML IJ SOLN
INTRAMUSCULAR | Status: AC
  Filled 2017-10-26: qty 2

## 2017-10-26 MED ORDER — ENOXAPARIN SODIUM 40 MG/0.4ML ~~LOC~~ SOLN
40.0000 mg | SUBCUTANEOUS | Status: DC
  Administered 2017-10-27 – 2017-10-29 (×3): 40 mg via SUBCUTANEOUS
  Filled 2017-10-26 (×3): qty 0.4

## 2017-10-26 MED ORDER — HYDROMORPHONE HCL 1 MG/ML IJ SOLN
0.5000 mg | INTRAMUSCULAR | Status: DC | PRN
  Administered 2017-10-26: 0.5 mg via INTRAVENOUS
  Administered 2017-10-26: 1 mg via INTRAVENOUS
  Filled 2017-10-26 (×2): qty 1

## 2017-10-26 MED ORDER — FENTANYL CITRATE (PF) 100 MCG/2ML IJ SOLN
INTRAMUSCULAR | Status: AC
  Filled 2017-10-26: qty 2

## 2017-10-26 MED ORDER — TRANEXAMIC ACID 1000 MG/10ML IV SOLN
1000.0000 mg | INTRAVENOUS | Status: AC
  Administered 2017-10-26: 1000 mg via INTRAVENOUS
  Filled 2017-10-26: qty 1000

## 2017-10-26 MED ORDER — BUPIVACAINE-EPINEPHRINE 0.25% -1:200000 IJ SOLN
INTRAMUSCULAR | Status: DC | PRN
  Administered 2017-10-26: 30 mL

## 2017-10-26 MED ORDER — METOCLOPRAMIDE HCL 10 MG PO TABS: 5.0000 mg | ORAL_TABLET | Freq: Three times a day (TID) | ORAL | Status: DC | PRN

## 2017-10-26 MED ORDER — METOCLOPRAMIDE HCL 5 MG/ML IJ SOLN
5.0000 mg | Freq: Three times a day (TID) | INTRAMUSCULAR | Status: DC | PRN
  Administered 2017-10-26: 10 mg via INTRAVENOUS
  Filled 2017-10-26: qty 2

## 2017-10-26 MED ORDER — PANTOPRAZOLE SODIUM 40 MG PO TBEC: 40.0000 mg | DELAYED_RELEASE_TABLET | Freq: Every day | ORAL | Status: DC | PRN

## 2017-10-26 MED ORDER — PHENOL 1.4 % MT LIQD: 1.0000 | OROMUCOSAL | Status: DC | PRN

## 2017-10-26 MED ORDER — BISACODYL 5 MG PO TBEC: 5.0000 mg | DELAYED_RELEASE_TABLET | Freq: Every day | ORAL | Status: DC | PRN

## 2017-10-26 MED ORDER — GABAPENTIN 300 MG PO CAPS
300.0000 mg | ORAL_CAPSULE | Freq: Three times a day (TID) | ORAL | Status: DC
  Administered 2017-10-26 – 2017-10-29 (×9): 300 mg via ORAL
  Filled 2017-10-26 (×9): qty 1

## 2017-10-26 MED ORDER — ESTRADIOL 1 MG PO TABS
2.0000 mg | ORAL_TABLET | Freq: Every day | ORAL | Status: DC
  Administered 2017-10-26 – 2017-10-28 (×3): 2 mg via ORAL
  Filled 2017-10-26 (×4): qty 2

## 2017-10-26 MED ORDER — ACETAMINOPHEN 325 MG PO TABS: 325.0000 mg | ORAL_TABLET | Freq: Four times a day (QID) | ORAL | Status: DC | PRN

## 2017-10-26 MED ORDER — FENTANYL CITRATE (PF) 100 MCG/2ML IJ SOLN
INTRAMUSCULAR | Status: AC
  Administered 2017-10-26: 25 ug via INTRAVENOUS
  Filled 2017-10-26: qty 2

## 2017-10-26 MED ORDER — METHOCARBAMOL 1000 MG/10ML IJ SOLN
500.0000 mg | Freq: Four times a day (QID) | INTRAVENOUS | Status: DC | PRN
  Filled 2017-10-26: qty 5

## 2017-10-26 MED ORDER — DOCUSATE SODIUM 100 MG PO CAPS
100.0000 mg | ORAL_CAPSULE | Freq: Two times a day (BID) | ORAL | Status: DC
  Administered 2017-10-26 – 2017-10-28 (×4): 100 mg via ORAL
  Filled 2017-10-26 (×6): qty 1

## 2017-10-26 MED ORDER — ONDANSETRON HCL 4 MG PO TABS: 4.0000 mg | ORAL_TABLET | Freq: Four times a day (QID) | ORAL | Status: DC | PRN

## 2017-10-26 MED ORDER — PROPOFOL 500 MG/50ML IV EMUL
INTRAVENOUS | Status: DC | PRN
  Administered 2017-10-26: 100 ug/kg/min via INTRAVENOUS

## 2017-10-26 MED ORDER — FENTANYL CITRATE (PF) 100 MCG/2ML IJ SOLN
INTRAMUSCULAR | Status: DC | PRN
  Administered 2017-10-26: 50 ug via INTRAVENOUS

## 2017-10-26 MED ORDER — HYDROCODONE-ACETAMINOPHEN 7.5-325 MG PO TABS
1.0000 | ORAL_TABLET | ORAL | Status: DC | PRN
  Administered 2017-10-26: 1 via ORAL
  Administered 2017-10-26: 2 via ORAL
  Administered 2017-10-26: 1 via ORAL
  Administered 2017-10-27: 2 via ORAL
  Administered 2017-10-27: 1 via ORAL
  Administered 2017-10-27 (×3): 2 via ORAL
  Administered 2017-10-27: 1 via ORAL
  Administered 2017-10-28 – 2017-10-29 (×4): 2 via ORAL
  Filled 2017-10-26 (×2): qty 1
  Filled 2017-10-26 (×5): qty 2
  Filled 2017-10-26 (×2): qty 1
  Filled 2017-10-26 (×4): qty 2

## 2017-10-26 MED ORDER — FENTANYL CITRATE (PF) 100 MCG/2ML IJ SOLN
25.0000 ug | INTRAMUSCULAR | Status: AC | PRN
  Administered 2017-10-26: 25 ug via INTRAVENOUS
  Administered 2017-10-26: 50 ug via INTRAVENOUS
  Administered 2017-10-26 (×3): 25 ug via INTRAVENOUS
  Administered 2017-10-26: 50 ug via INTRAVENOUS

## 2017-10-26 MED ORDER — PROPOFOL 10 MG/ML IV BOLUS
INTRAVENOUS | Status: DC | PRN
  Administered 2017-10-26: 50 mg via INTRAVENOUS

## 2017-10-26 MED ORDER — MENTHOL 3 MG MT LOZG: 1.0000 | LOZENGE | OROMUCOSAL | Status: DC | PRN

## 2017-10-26 MED ORDER — SUMATRIPTAN SUCCINATE 50 MG PO TABS
50.0000 mg | ORAL_TABLET | ORAL | Status: DC | PRN
  Administered 2017-10-26: 50 mg via ORAL
  Filled 2017-10-26 (×2): qty 1

## 2017-10-26 MED ORDER — SEVOFLURANE IN SOLN
RESPIRATORY_TRACT | Status: AC
Start: 2017-10-26 — End: ?
  Filled 2017-10-26: qty 250

## 2017-10-26 MED ORDER — SODIUM CHLORIDE 0.9 % IV SOLN
INTRAVENOUS | Status: DC
  Administered 2017-10-26: 11:00:00 via INTRAVENOUS

## 2017-10-26 MED ORDER — BUPIVACAINE HCL (PF) 0.5 % IJ SOLN
INTRAMUSCULAR | Status: DC | PRN
  Administered 2017-10-26: 3 mL

## 2017-10-26 MED ORDER — METHOCARBAMOL 500 MG PO TABS
500.0000 mg | ORAL_TABLET | Freq: Four times a day (QID) | ORAL | Status: DC | PRN
  Administered 2017-10-26 – 2017-10-28 (×4): 500 mg via ORAL
  Filled 2017-10-26 (×4): qty 1

## 2017-10-26 MED ORDER — SUMATRIPTAN SUCCINATE 50 MG PO TABS
100.0000 mg | ORAL_TABLET | ORAL | Status: DC | PRN
  Filled 2017-10-26: qty 2

## 2017-10-26 MED ORDER — ZOLPIDEM TARTRATE 5 MG PO TABS: 5.0000 mg | ORAL_TABLET | Freq: Every evening | ORAL | Status: DC | PRN

## 2017-10-26 MED ORDER — NEOMYCIN-POLYMYXIN B GU 40-200000 IR SOLN
Status: AC
  Filled 2017-10-26: qty 4

## 2017-10-26 MED ORDER — SENNOSIDES-DOCUSATE SODIUM 8.6-50 MG PO TABS: 1.0000 | ORAL_TABLET | Freq: Every evening | ORAL | Status: DC | PRN

## 2017-10-26 MED ORDER — CEFAZOLIN SODIUM-DEXTROSE 2-4 GM/100ML-% IV SOLN
INTRAVENOUS | Status: AC
  Filled 2017-10-26: qty 100

## 2017-10-26 MED ORDER — MAGNESIUM CITRATE PO SOLN
1.0000 | Freq: Once | ORAL | Status: AC | PRN
Start: 2017-10-26 — End: 2017-10-28
  Administered 2017-10-28: 1 via ORAL
  Filled 2017-10-26: qty 296

## 2017-10-26 MED ORDER — SODIUM CHLORIDE 0.9 % IV SOLN
INTRAVENOUS | Status: DC | PRN
  Administered 2017-10-26: 60 mL

## 2017-10-26 MED ORDER — CEFAZOLIN SODIUM-DEXTROSE 2-4 GM/100ML-% IV SOLN
2.0000 g | Freq: Four times a day (QID) | INTRAVENOUS | Status: AC
  Administered 2017-10-26 – 2017-10-27 (×3): 2 g via INTRAVENOUS
  Filled 2017-10-26 (×3): qty 100

## 2017-10-26 MED ORDER — BUPIVACAINE-EPINEPHRINE (PF) 0.25% -1:200000 IJ SOLN
INTRAMUSCULAR | Status: AC
  Filled 2017-10-26: qty 30

## 2017-10-26 MED ORDER — ONDANSETRON HCL 4 MG/2ML IJ SOLN
INTRAMUSCULAR | Status: AC
  Filled 2017-10-26: qty 2

## 2017-10-26 MED ORDER — ONDANSETRON HCL 4 MG/2ML IJ SOLN
4.0000 mg | Freq: Four times a day (QID) | INTRAMUSCULAR | Status: DC | PRN
  Filled 2017-10-26: qty 2

## 2017-10-26 MED ORDER — ONDANSETRON HCL 4 MG/2ML IJ SOLN
INTRAMUSCULAR | Status: DC | PRN
  Administered 2017-10-26: 4 mg via INTRAVENOUS

## 2017-10-26 MED ORDER — NEOMYCIN-POLYMYXIN B GU 40-200000 IR SOLN
Status: DC | PRN
  Administered 2017-10-26: 4 mL

## 2017-10-26 MED ORDER — LACTATED RINGERS IV SOLN
INTRAVENOUS | Status: DC
  Administered 2017-10-26: 50 mL/h via INTRAVENOUS
  Administered 2017-10-26 (×2): via INTRAVENOUS

## 2017-10-26 MED ORDER — ALUM & MAG HYDROXIDE-SIMETH 200-200-20 MG/5ML PO SUSP: 30.0000 mL | ORAL | Status: DC | PRN

## 2017-10-26 MED ORDER — AMLODIPINE BESYLATE 5 MG PO TABS
5.0000 mg | ORAL_TABLET | Freq: Every day | ORAL | Status: DC
  Administered 2017-10-27 – 2017-10-29 (×3): 5 mg via ORAL
  Filled 2017-10-26 (×3): qty 1

## 2017-10-26 MED ORDER — MONTELUKAST SODIUM 10 MG PO TABS
10.0000 mg | ORAL_TABLET | Freq: Every day | ORAL | Status: DC
  Administered 2017-10-26 – 2017-10-28 (×3): 10 mg via ORAL
  Filled 2017-10-26 (×3): qty 1

## 2017-10-26 SURGICAL SUPPLY — 57 items
BLADE SAGITTAL AGGR TOOTH XLG (BLADE) ×2 IMPLANT
BNDG COHESIVE 6X5 TAN STRL LF (GAUZE/BANDAGES/DRESSINGS) ×6 IMPLANT
CANISTER SUCT 1200ML W/VALVE (MISCELLANEOUS) ×2 IMPLANT
CHLORAPREP W/TINT 26ML (MISCELLANEOUS) ×2 IMPLANT
DRAPE C-ARM XRAY 36X54 (DRAPES) ×2 IMPLANT
DRAPE INCISE IOBAN 66X60 STRL (DRAPES) IMPLANT
DRAPE POUCH INSTRU U-SHP 10X18 (DRAPES) ×2 IMPLANT
DRAPE SHEET LG 3/4 BI-LAMINATE (DRAPES) ×6 IMPLANT
DRAPE TABLE BACK 80X90 (DRAPES) ×2 IMPLANT
DRESSING SURGICEL FIBRLLR 1X2 (HEMOSTASIS) ×2 IMPLANT
DRSG OPSITE POSTOP 4X8 (GAUZE/BANDAGES/DRESSINGS) ×4 IMPLANT
DRSG SURGICEL FIBRILLAR 1X2 (HEMOSTASIS) ×4
ELECT BLADE 6.5 EXT (BLADE) ×2 IMPLANT
ELECT REM PT RETURN 9FT ADLT (ELECTROSURGICAL) ×2
ELECTRODE REM PT RTRN 9FT ADLT (ELECTROSURGICAL) ×1 IMPLANT
GLOVE BIOGEL PI IND STRL 9 (GLOVE) ×1 IMPLANT
GLOVE BIOGEL PI INDICATOR 9 (GLOVE) ×1
GLOVE SURG SYN 9.0  PF PI (GLOVE) ×2
GLOVE SURG SYN 9.0 PF PI (GLOVE) ×2 IMPLANT
GOWN SRG 2XL LVL 4 RGLN SLV (GOWNS) ×1 IMPLANT
GOWN STRL NON-REIN 2XL LVL4 (GOWNS) ×1
GOWN STRL REUS W/ TWL LRG LVL3 (GOWN DISPOSABLE) ×1 IMPLANT
GOWN STRL REUS W/TWL LRG LVL3 (GOWN DISPOSABLE) ×1
HEAD FEMORAL 28MM SZ S (Head) ×2 IMPLANT
HEMOVAC 400CC 10FR (MISCELLANEOUS) IMPLANT
HIP DBL LINER 54X28 (Liner) ×2 IMPLANT
HOLDER FOLEY CATH W/STRAP (MISCELLANEOUS) ×2 IMPLANT
HOOD PEEL AWAY FLYTE STAYCOOL (MISCELLANEOUS) ×2 IMPLANT
KIT PREVENA INCISION MGT 13 (CANNISTER) ×2 IMPLANT
MAT ABSORB  FLUID 56X50 GRAY (MISCELLANEOUS) ×1
MAT ABSORB FLUID 56X50 GRAY (MISCELLANEOUS) ×1 IMPLANT
NDL SAFETY ECLIPSE 18X1.5 (NEEDLE) ×1 IMPLANT
NEEDLE HYPO 18GX1.5 SHARP (NEEDLE) ×1
NEEDLE SPNL 18GX3.5 QUINCKE PK (NEEDLE) ×2 IMPLANT
NS IRRIG 1000ML POUR BTL (IV SOLUTION) ×2 IMPLANT
PACK HIP COMPR (MISCELLANEOUS) ×2 IMPLANT
SCALPEL PROTECTED #10 DISP (BLADE) ×4 IMPLANT
SHELL ACETABULAR SZ 54 DM (Shell) ×2 IMPLANT
SOL PREP PVP 2OZ (MISCELLANEOUS) ×2
SOLUTION PREP PVP 2OZ (MISCELLANEOUS) ×1 IMPLANT
SPONGE DRAIN TRACH 4X4 STRL 2S (GAUZE/BANDAGES/DRESSINGS) ×2 IMPLANT
STAPLER SKIN PROX 35W (STAPLE) ×2 IMPLANT
STEM FEMORAL SZ0 STD COLLARED (Stem) ×2 IMPLANT
STRAP SAFETY 5IN WIDE (MISCELLANEOUS) ×2 IMPLANT
SUT DVC 2 QUILL PDO  T11 36X36 (SUTURE) ×1
SUT DVC 2 QUILL PDO T11 36X36 (SUTURE) ×1 IMPLANT
SUT SILK 0 (SUTURE) ×1
SUT SILK 0 30XBRD TIE 6 (SUTURE) ×1 IMPLANT
SUT V-LOC 90 ABS DVC 3-0 CL (SUTURE) ×2 IMPLANT
SUT VIC AB 1 CT1 36 (SUTURE) ×2 IMPLANT
SYR 20CC LL (SYRINGE) ×2 IMPLANT
SYR 30ML LL (SYRINGE) ×2 IMPLANT
SYR BULB IRRIG 60ML STRL (SYRINGE) ×2 IMPLANT
TAPE MICROFOAM 4IN (TAPE) ×2 IMPLANT
TOWEL OR 17X26 4PK STRL BLUE (TOWEL DISPOSABLE) ×2 IMPLANT
TRAY FOLEY MTR SLVR 16FR STAT (SET/KITS/TRAYS/PACK) ×2 IMPLANT
WND VAC CANISTER 500ML (MISCELLANEOUS) ×2 IMPLANT

## 2017-10-26 NOTE — OR Nursing (Signed)
Patient arrived in preop with complaints of a migraine headache, nausea and vomiting since last pm. She did not take any of her available medications for this as she was not sure that she could.  bp was elevated on admit but came down later.  Dr. Amie Critchley notified and ordered imitrex to be taken prior to going back for surgery.  Medicine has not arrived from pharmacy prior to surgery so it will be taken to pacu for use after procedure.

## 2017-10-26 NOTE — Progress Notes (Signed)
PT Cancellation Note  Patient Details Name: Paula Moses MRN: [id number] DOB: 1965-05-12   Cancelled Treatment:    Reason Eval/Treat Not Completed: Pain limiting ability to participate;Other (comment)(Patient reports current pain is 8/10, has requested pain medication and for PT to come back later. PT will follow up later this afternoon.)   Lieutenant Diego PT, DPT 1:45 PM,10/26/17 [phone]

## 2017-10-26 NOTE — Anesthesia Post-op Follow-up Note (Signed)
Anesthesia QCDR form completed.        

## 2017-10-26 NOTE — Progress Notes (Signed)
Pt has been given hydrocodone and 1 mg dilaudid since arrival from PACU. Pt has been resting, will drift off to sleep easily, but is arousable and appropriate when awake. PT informed RN that pt requested more pain medication during their attempt to work with her. Pt and family educated that at this time we will hold off on any additional pain medication so as not to oversedate pt.   [place], Jerry Caras

## 2017-10-26 NOTE — Progress Notes (Addendum)
Pt admitted to room 156 from PACU. Pt is A&Ox4, rates pain 10/10. Pt can flex both thighs, both feet/lower legs numb, has sensation at knee level. Wound vac and hemovac intact to right hip, foley in place. Pt educated on pain medication regimen and plan of care. Family at bedside.   Paula Moses, Paula Moses

## 2017-10-26 NOTE — NC FL2 (Signed)
[place] LEVEL OF CARE SCREENING TOOL     IDENTIFICATION  Patient Name: Paula Moses Birthdate: [birth year] Sex: female Admission Date (Current Location): 10/26/2017  Center and [place] Number:  Engineering geologist and Address:  [facility], [address], [place], [place] [address]      Provider Number: [id number]  Attending Physician Name and Address:  [name], MD  Relative Name and Phone Number:       Current Level of Care: Hospital Recommended Level of Care: [place] Prior Approval Number:    Date Approved/Denied:   PASRR Number: [id number])  Discharge Plan: SNF    Current Diagnoses: Patient Active Problem List   Diagnosis Date Noted  . Status post total hip replacement, right 10/26/2017  . Femoroacetabular impingement 05/05/2017  . Surgical menopause 01/26/2017  . Status post vaginal hysterectomy BSO 01/26/2017  . Hypertension   . High cholesterol   . Fibroids   . Trigeminal neuralgia   . Left-sided face pain   . Neck pain on left side 07/16/2013  . Left facial pain 07/16/2013  . Paresthesia 07/16/2013    Orientation RESPIRATION BLADDER Height & Weight     Self, Time, Situation, Place  Normal Continent Weight: 171 lb (77.6 kg) Height:  5\' 5"  (165.1 cm)  BEHAVIORAL SYMPTOMS/MOOD NEUROLOGICAL BOWEL NUTRITION STATUS      Continent Diet(Diet: Regular )  AMBULATORY STATUS COMMUNICATION OF NEEDS Skin   Extensive Assist Verbally Surgical wounds, Wound Vac(Incision: Right Hip. Provena Wound Vac. )                       Personal Care Assistance Level of Assistance  Bathing, Feeding, Dressing Bathing Assistance: Limited assistance Feeding assistance: Independent Dressing Assistance: Limited assistance     Functional Limitations Info  Sight, Hearing, Speech Sight Info: Adequate Hearing Info: Adequate Speech Info: Adequate    SPECIAL CARE FACTORS FREQUENCY  PT (By licensed PT), OT  (By licensed OT)     PT Frequency: (5) OT Frequency: (5)            Contractures      Additional Factors Info  Code Status, Allergies Code Status Info: (Full Code. ) Allergies Info: (Aspirin, Codeine, Percocet Oxycodone-acetaminophen, Phenergan Promethazine Hcl)           Current Medications (10/26/2017):  This is the current hospital active medication list Current Facility-Administered Medications  Medication Dose Route Frequency Provider Last Rate Last Dose  . 0.9 %  sodium chloride infusion   Intravenous Continuous [name], MD 100 mL/hr at 10/26/17 1129    . [START ON 10/27/2017] acetaminophen (TYLENOL) tablet 325-650 mg  325-650 mg Oral Q6H PRN [name], MD      . alum & mag hydroxide-simeth (MAALOX/MYLANTA) 200-200-20 MG/5ML suspension 30 mL  30 mL Oral Q4H PRN [name], MD      . Derrill Memo ON 10/27/2017] amLODipine (NORVASC) tablet 5 mg  5 mg Oral Daily [name], MD      . bisacodyl (DULCOLAX) EC tablet 5 mg  5 mg Oral Daily PRN [name], MD      . ceFAZolin (ANCEF) IVPB 2g/100 mL premix  2 g Intravenous Q6H [name], MD   Stopped at 10/26/17 1256  . diphenhydrAMINE (BENADRYL) 12.5 MG/5ML elixir 12.5-25 mg  12.5-25 mg Oral Q4H PRN [name], MD      . docusate sodium (COLACE) capsule 100 mg  100 mg Oral BID [name],  MD      . Derrill Memo ON 10/27/2017] enoxaparin (LOVENOX) injection 40 mg  40 mg Subcutaneous Q24H [name], MD      . estradiol (ESTRACE) tablet 2 mg  2 mg Oral QHS [name], MD      . gabapentin (NEURONTIN) capsule 300 mg  300 mg Oral TID [name], MD   300 mg at 10/26/17 1522  . HYDROcodone-acetaminophen (NORCO) 7.5-325 MG per tablet 1-2 tablet  1-2 tablet Oral Q4H PRN [name], MD   1 tablet at 10/26/17 1522  . HYDROmorphone (DILAUDID) injection 0.5-1 mg  0.5-1 mg Intravenous Q4H PRN [name], MD   1 mg at 10/26/17 1222  . magnesium citrate solution 1 Bottle  1 Bottle Oral Once PRN [name], MD       . menthol-cetylpyridinium (CEPACOL) lozenge 3 mg  1 lozenge Oral PRN [name], MD       Or  . phenol (CHLORASEPTIC) mouth spray 1 spray  1 spray Mouth/Throat PRN [name], MD      . methocarbamol (ROBAXIN) tablet 500 mg  500 mg Oral Q6H PRN [name], MD       Or  . methocarbamol (ROBAXIN) 500 mg in dextrose 5 % 50 mL IVPB  500 mg Intravenous Q6H PRN [name], MD      . metoCLOPramide (REGLAN) tablet 5-10 mg  5-10 mg Oral Q8H PRN [name], MD       Or  . metoCLOPramide (REGLAN) injection 5-10 mg  5-10 mg Intravenous Q8H PRN [name], MD   10 mg at 10/26/17 1528  . montelukast (SINGULAIR) tablet 10 mg  10 mg Oral QHS [name], MD      . ondansetron [facility]) tablet 4 mg  4 mg Oral Q6H PRN [name], MD       Or  . ondansetron [facility]) injection 4 mg  4 mg Intravenous Q6H PRN [name], MD      . pantoprazole (PROTONIX) EC tablet 40 mg  40 mg Oral Daily PRN [name], MD      . senna-docusate (Senokot-S) tablet 1 tablet  1 tablet Oral QHS PRN [name], MD      . SUMAtriptan (IMITREX) tablet 100 mg  100 mg Oral Q2H PRN [name], MD      . SUMAtriptan (IMITREX) tablet 50 mg  50 mg Oral Q2H PRN Piscitello, Precious Haws, MD   50 mg at 10/26/17 1123  . traMADol (ULTRAM) tablet 50 mg  50 mg Oral Q6H [name], MD      . zolpidem [facility]) tablet 5 mg  5 mg Oral QHS PRN [name], MD         Discharge Medications: Please see discharge summary for a list of discharge medications.  Relevant Imaging Results:  Relevant Lab Results:   Additional Information (SSN: [id number])  Tonnette Zwiebel, Veronia Beets, LCSW

## 2017-10-26 NOTE — Anesthesia Procedure Notes (Signed)
Spinal  Patient location during procedure: OR Start time: 10/26/2017 7:20 AM End time: 10/26/2017 7:26 AM Staffing Resident/CRNA: Noles, Mark, CRNA Performed: resident/CRNA  Preanesthetic Checklist Completed: patient identified, site marked, surgical consent, pre-op evaluation, timeout performed, IV checked, risks and benefits discussed and monitors and equipment checked Spinal Block Patient position: sitting Prep: Betadine Patient monitoring: heart rate, continuous pulse ox, blood pressure and cardiac monitor Approach: midline Location: L3-4 Injection technique: single-shot Needle Needle type: Whitacre and Introducer  Needle gauge: 25 G Needle length: 9 cm Assessment Sensory level: T10 Additional Notes Negative paresthesia. Negative blood return. Positive free-flowing CSF. Expiration date of kit checked and confirmed. Patient tolerated procedure well, without complications.       

## 2017-10-26 NOTE — Evaluation (Signed)
Physical Therapy Evaluation Patient Details Name: Paula Moses MRN: [id number] DOB: 1965-10-23 Today's Date: 10/26/2017   History of Present Illness  Patient is 52 yo female s/p anterior THA 10/26/17, PMH of HTN, headaches, anxiety, GERD  Clinical Impression  Patient alert and agreeable to PT at start of session, pain levels still8-9/10 despite medication and time. Prior to admission patient was independent in two story home with family, 5 STE, no full bath on first floor. Family discussed upon discharge she will be staying at her mother's house with level entry and 1 story, with family to assist as needed. Able to perform bed level therapeutic exercises with assist from PT with complaints of increased pain. Patient unable to perform mobility due to significant pain levels, the patient would benefit from further skilled PT to assess mobility, as well as to address deficits (see PT problem list below), and further education. Currently hesitant plan is home with [facility] and 24/7 supervision, pending patient progress.    Follow Up Recommendations Home health PT;Supervision/Assistance - 24 hour    Equipment Recommendations  Rolling walker with 5" wheels    Recommendations for Other Services       Precautions / Restrictions Precautions Precautions: Anterior Hip Precaution Booklet Issued: Yes (comment) Restrictions Weight Bearing Restrictions: Yes RLE Weight Bearing: Weight bearing as tolerated      Mobility  Bed Mobility               General bed mobility comments: Unable to fully assess due to pain  Transfers                 General transfer comment: Unable to fully assess due to pain  Ambulation/Gait             General Gait Details: Unable to fully assess due to pain  Stairs            Wheelchair Mobility    Modified Rankin (Stroke Patients Only)       Balance                                             Pertinent Vitals/Pain  Pain Assessment: 0-10 Pain Score: 10-Worst pain ever Pain Descriptors / Indicators: Aching;Sharp Pain Intervention(s): Limited activity within patient's tolerance;Monitored during session;Premedicated before session;Repositioned    Home Living Family/patient expects to be discharged to:: Private residence Living Arrangements: Spouse/significant other;Parent Available Help at Discharge: Family Type of Home: House Home Access: Level entry     Home Layout: One level Home Equipment: Bedside commode;Walker - standard      Prior Function Level of Independence: Independent               Hand Dominance   Dominant Hand: Right    Extremity/Trunk Assessment   Upper Extremity Assessment Upper Extremity Assessment: Overall WFL for tasks assessed    Lower Extremity Assessment Lower Extremity Assessment: RLE deficits/detail;LLE deficits/detail RLE: Unable to fully assess due to pain LLE Deficits / Details: 4/5       Communication   Communication: No difficulties  Cognition Arousal/Alertness: Awake/alert Behavior During Therapy: WFL for tasks assessed/performed Overall Cognitive Status: Within Functional Limits for tasks assessed  General Comments      Exercises Total Joint Exercises Ankle Circles/Pumps: AROM;Both;20 reps Quad Sets: AROM;Right;15 reps Gluteal Sets: AROM;Both;15 reps Towel Squeeze: AROM;Both;15 reps Hip ABduction/ADduction: AAROM;Right;15 reps   Assessment/Plan    PT Assessment Patient needs continued PT services  PT Problem List Decreased strength;Decreased range of motion;Decreased knowledge of use of DME;Decreased activity tolerance;Decreased balance;Pain;Decreased knowledge of precautions;Decreased mobility       PT Treatment Interventions DME instruction;Balance training;Gait training;Neuromuscular re-education;Stair training;Functional mobility training;Patient/family education;Therapeutic  activities;Therapeutic exercise    PT Goals (Current goals can be found in the Care Plan section)  Acute Rehab PT Goals Patient Stated Goal: Patient wants pain to be controlled PT Goal Formulation: With patient Time For Goal Achievement: 11/09/17 Potential to Achieve Goals: Good    Frequency BID   Barriers to discharge        Co-evaluation               AM-PAC PT "6 Clicks" Daily Activity  Outcome Measure Difficulty turning over in bed (including adjusting bedclothes, sheets and blankets)?: A Little Difficulty moving from lying on back to sitting on the side of the bed? : Unable Difficulty sitting down on and standing up from a chair with arms (e.g., wheelchair, bedside commode, etc,.)?: Unable Help needed moving to and from a bed to chair (including a wheelchair)?: A Lot Help needed walking in hospital room?: A Lot Help needed climbing 3-5 steps with a railing? : A Lot 6 Click Score: 11    End of Session Equipment Utilized During Treatment: Gait belt Activity Tolerance: Patient limited by pain Patient left: in bed;with call bell/phone within reach;with bed alarm set;with SCD's reapplied;with family/visitor present Nurse Communication: Mobility status PT Visit Diagnosis: Difficulty in walking, not elsewhere classified (R26.2);Unsteadiness on feet (R26.81);Other abnormalities of gait and mobility (R26.89);Pain Pain - Right/Left: Right Pain - part of body: Hip    Time: 1610-9604 PT Time Calculation (min) (ACUTE ONLY): 27 min   Charges:   PT Evaluation $PT Eval Low Complexity: 1 Low PT Treatments $Therapeutic Exercise: 8-22 mins      Lieutenant Diego PT, DPT 3:25 PM,10/26/17 [phone]

## 2017-10-26 NOTE — Op Note (Signed)
10/26/2017  9:46 AM  PATIENT:  Paula Moses  52 y.o. female  PRE-OPERATIVE DIAGNOSIS:  PRIMARY OSTEOARTHRITIS OF RIGHT HIP  POST-OPERATIVE DIAGNOSIS:  PRIMARY OSTEOARTHRITIS OF RIGHT HIP  PROCEDURE:  Procedure(s): TOTAL HIP ARTHROPLASTY ANTERIOR APPROACH (Right)  SURGEON: Laurene Footman, MD  ASSISTANTS: none  ANESTHESIA:   spinal  EBL:  Total I/O In: 2000 [I.V.:2000] Out: 475 [Urine:75; Blood:400]  BLOOD ADMINISTERED:none  DRAINS: (2) Hemovact drain(s) in the subcutaneous layer with  Suction Open   LOCAL MEDICATIONS USED:  MARCAINE    and OTHER Exparel  SPECIMEN:  Source of Specimen:  Right femoral head  DISPOSITION OF SPECIMEN:  PATHOLOGY  COUNTS:  YES  TOURNIQUET:  * No tourniquets in log *  IMPLANTS: Medacta AMIS O standard stem, 54 mm Mpact DM cup and liner, 28 mm S ceramic head  DICTATION: .Dragon Dictation   The patient was brought to the operating room and after spinal anesthesia was obtained patient was placed on the operative table with the ipsilateral foot into the Medacta attachment, contralateral leg on a well-padded table. C-arm was brought in and preop template x-ray taken. After prepping and draping in usual sterile fashion appropriate patient identification and timeout procedures were completed. Anterior approach to the hip was obtained and centered over the greater trochanter and TFL muscle. The subcutaneous tissue was incised hemostasis being achieved by electrocautery. TFL fascia was incised and the muscle retracted laterally deep retractor placed, this was difficult secondary to scarring between the ATFL and quadriceps. The lateral femoral circumflex vessels were identified and ligated. The anterior capsule was exposed and a capsulotomy performed. The neck was identified and a femoral neck cut carried out with a saw. The head was removed without difficulty and showed sclerotic femoral head and acetabulum. Reaming was carried out to 52 mm and a 54 mm cup  trial gave appropriate tightness to the acetabular component a 54 DM cup was impacted into position. The leg was then externally rotated and ischiofemoral and pubofemoral releases carried out. The femur was sequentially broached to a size 0, size 0 standard with S head trials were placed and the final components chosen. The 0 standard stem was inserted along with a ceramic S 28 mm head and 54 mm liner. The hip was reduced and was stable the wound was thoroughly irrigated with fibrillar placed along the posterior capsule release and medial neck cut.  Additionally Exparel was injected prior to implantation along the posterior capsule and anteriorly for postop analgesia n. The deep fascia closed a heavy Quill after infiltration of 30 cc of quarter percent Sensorcaine with epinephrine. Subcutaneous drains were then inserted with 3-0 v-loc cuticular closure skin staples and incisional wound VAC   PLAN OF CARE: Admit to inpatient

## 2017-10-26 NOTE — Care Management Note (Signed)
Case Management Note  Patient Details  Name: Paula Moses MRN: [id number] Date of Birth: 1965/05/28  Subjective/Objective:                  RNCM spoke with patient regarding transition of care. She plans to return home with husband at discharge. She is having some pain right now and received pain medication about one hour ago. RNCM advised patient to reach back out to nurse for assistance- she agreed.  She has a borrowed front-wheeled walker available- advised her to have family bring it in to size her up. She uses [facility] [phone] for medications. She has already talked with Sonia Side at [facility] for home PT.  Action/Plan:   Referral sent to [facility] with [facility]. Previous RNCM Lattie Haw report left that Dr. Rudene Christians will discharge patient on Lovenox 40mg  injection daily for 14 days no refills. This has been called in to [facility] for price and prior authorization if needed.   Expected Discharge Date:                  Expected Discharge Plan:     In-House Referral:     Discharge planning Services  CM Consult  Post Acute Care Choice:  Home Health Choice offered to:  Patient  DME Arranged:    DME Agency:     HH Arranged:  PT [place]:  [facility] (formerly [employer])  Status of Service:  In process, will continue to follow  If discussed at Long Length of Stay Meetings, dates discussed:    Additional Comments:  Marshell Garfinkel, RN 10/26/2017, 4:44 PM

## 2017-10-26 NOTE — Anesthesia Preprocedure Evaluation (Signed)
Anesthesia Evaluation  Patient identified by MRN, date of birth, ID band Patient awake    Reviewed: Allergy & Precautions, H&P , NPO status , Patient's Chart, lab work & pertinent test results  History of Anesthesia Complications Negative for: history of anesthetic complications  Airway Mallampati: II  TM Distance: >3 FB Neck ROM: full    Dental  (+) Chipped   Pulmonary neg pulmonary ROS, neg shortness of breath,           Cardiovascular hypertension,      Neuro/Psych  Headaches, Anxiety Migraine today  Neuromuscular disease    GI/Hepatic Neg liver ROS, GERD  Medicated and Controlled,  Endo/Other  negative endocrine ROS  Renal/GU negative Renal ROS  negative genitourinary   Musculoskeletal   Abdominal   Peds  Hematology negative hematology ROS (+)   Anesthesia Other Findings Past Medical History: No date: Abnormal Pap smear of cervix No date: Abnormal vaginal bleeding No date: Anxiety No date: Arthritis     Comment:  neck and body No date: Complication of anesthesia     Comment:  IT TAKES ALOT OF ANESTHESIA TO KEEP PT SEDATED No date: Cyst of vulva No date: Dyspareunia No date: External hemorrhoid No date: Fibroids No date: GERD (gastroesophageal reflux disease) 2019: Headache     Comment:  migraines, becoming more frequent No date: High cholesterol No date: History of UTI No date: Hypertension No date: Left-sided face pain No date: Neck pain No date: Paresthesia No date: Rectocele No date: Surgical menopause No date: Trigeminal neuralgia No date: Uterus prolapse  Past Surgical History: 2006: AUGMENTATION MAMMAPLASTY; Bilateral No date: BREAST ENHANCEMENT SURGERY No date: CHOLECYSTECTOMY 05/05/2017: HIP ARTHROSCOPY; Right     Comment:  Procedure: ARTHROSCOPY HIP;  Surgeon: Leim Fabry, MD;               Location: [facility] ORS;  Service: Orthopedics;  Laterality:               Right; No date:  LAPAROSCOPY     Comment:  X2 No date: tummy tuck  No date: VAGINAL HYSTERECTOMY     Comment:  lahv bso  BMI    Body Mass Index:  28.47 kg/m      Reproductive/Obstetrics negative OB ROS                             Anesthesia Physical Anesthesia Plan  ASA: III  Anesthesia Plan: Spinal   Post-op Pain Management:    Induction:   PONV Risk Score and Plan:   Airway Management Planned: Natural Airway and Nasal Cannula  Additional Equipment:   Intra-op Plan:   Post-operative Plan:   Informed Consent: I have reviewed the patients History and Physical, chart, labs and discussed the procedure including the risks, benefits and alternatives for the proposed anesthesia with the patient or authorized representative who has indicated his/her understanding and acceptance.   Dental Advisory Given  Plan Discussed with: Anesthesiologist, CRNA and Surgeon  Anesthesia Plan Comments: (Patient reports no bleeding problems and no anticoagulant use.  Plan for spinal with backup GA  Patient consented for risks of anesthesia including but not limited to:  - adverse reactions to medications - risk of bleeding, infection, nerve damage and headache - risk of failed spinal - damage to teeth, lips or other oral mucosa - sore throat or hoarseness - Damage to heart, brain, lungs or loss of life  Patient voiced understanding.)  Anesthesia Quick Evaluation  

## 2017-10-26 NOTE — H&P (Signed)
Reviewed paper H+P, will be scanned into chart. No changes noted.  

## 2017-10-26 NOTE — Transfer of Care (Signed)
Immediate Anesthesia Transfer of Care Note  Patient: Paula Moses  Procedure(s) Performed: TOTAL HIP ARTHROPLASTY ANTERIOR APPROACH (Right )  Patient Location: PACU  Anesthesia Type:Spinal  Level of Consciousness: sedated  Airway & Oxygen Therapy: Patient Spontanous Breathing and Patient connected to face mask oxygen  Post-op Assessment: Report given to RN and Post -op Vital signs reviewed and stable  Post vital signs: Reviewed and stable  Last Vitals:  Vitals Value Taken Time  BP    Temp    Pulse 71 10/26/2017  9:41 AM  Resp 17 10/26/2017  9:41 AM  SpO2 100 % 10/26/2017  9:41 AM  Vitals shown include unvalidated device data.  Last Pain:  Vitals:   10/26/17 0626  TempSrc: Oral  PainSc: 10-Worst pain ever      Patients Stated Pain Goal: 0 (38/93/73 4287)  Complications: No apparent anesthesia complications

## 2017-10-27 LAB — BASIC METABOLIC PANEL
ANION GAP: 5 (ref 5–15)
BUN: <5 mg/dL — ABNORMAL LOW (ref 6–20)
CALCIUM: 8.2 mg/dL — AB (ref 8.9–10.3)
CO2: 29 mmol/L (ref 22–32)
CREATININE: 0.77 mg/dL (ref 0.44–1.00)
Chloride: 106 mmol/L (ref 98–111)
GFR calc Af Amer: >60 mL/min (ref >60)
GFR calc non Af Amer: >60 mL/min (ref >60)
GLUCOSE: 105 mg/dL — AB (ref 70–99)
Potassium: 3.6 mmol/L (ref 3.5–5.1)
Sodium: 140 mmol/L (ref 135–145)

## 2017-10-27 LAB — CBC
HEMATOCRIT: 35.5 % (ref 35.0–47.0)
Hemoglobin: 12.3 g/dL (ref 12.0–16.0)
MCH: 30.3 pg (ref 26.0–34.0)
MCHC: 34.8 g/dL (ref 32.0–36.0)
MCV: 87 fL (ref 80.0–100.0)
PLATELETS: 231 10*3/uL (ref 150–440)
RBC: 4.08 MIL/uL (ref 3.80–5.20)
RDW: 13.2 % (ref 11.5–14.5)
WBC: 8.1 10*3/uL (ref 3.6–11.0)

## 2017-10-27 MED ORDER — ENOXAPARIN SODIUM 40 MG/0.4ML ~~LOC~~ SOLN: 40.0000 mg | SUBCUTANEOUS | 0 refills | Status: DC

## 2017-10-27 MED ORDER — HYDROCODONE-ACETAMINOPHEN 7.5-325 MG PO TABS: 1.0000 | ORAL_TABLET | ORAL | 0 refills | Status: DC | PRN

## 2017-10-27 MED ORDER — MAGNESIUM HYDROXIDE 400 MG/5ML PO SUSP
30.0000 mL | Freq: Every day | ORAL | Status: DC
  Administered 2017-10-28: 30 mL via ORAL
  Filled 2017-10-27: qty 30

## 2017-10-27 NOTE — Progress Notes (Signed)
Patient c/o pain, only one Norco 7.5/325 in pyxis. Inventoried med and Conservation officer, historic buildings. Gave one pill at 0801. Gave second tab when pyxis was restocked at [phone].

## 2017-10-27 NOTE — Anesthesia Postprocedure Evaluation (Signed)
Anesthesia Post Note  Patient: Paula Moses  Procedure(s) Performed: TOTAL HIP ARTHROPLASTY ANTERIOR APPROACH (Right )  Patient location during evaluation: Nursing Unit Anesthesia Type: Spinal Level of consciousness: awake and alert and oriented Pain management: satisfactory to patient Vital Signs Assessment: post-procedure vital signs reviewed and stable Respiratory status: respiratory function stable Cardiovascular status: stable Postop Assessment: no headache, no backache, spinal receding, no apparent nausea or vomiting, patient able to bend at knees, adequate PO intake and able to ambulate Anesthetic complications: no     Last Vitals:  Vitals:   10/27/17 0048 10/27/17 0446  BP: 135/81 140/77  Pulse: 74 75  Resp: 17   Temp: 36.6 C 36.7 C  SpO2: 100% 100%    Last Pain:  Vitals:   10/27/17 0446  TempSrc: Oral  PainSc:                  Blima Singer

## 2017-10-27 NOTE — Progress Notes (Signed)
Clinical Social Worker (CSW) received SNF consult. PT is recommending home health. RN case manager aware of above. Please reconsult if future social work needs arise. CSW signing off.   Jackelin Correia, LCSW [phone] 

## 2017-10-27 NOTE — Discharge Instructions (Signed)
ANTERIOR APPROACH TOTAL HIP REPLACEMENT POSTOPERATIVE DIRECTIONS   Hip Rehabilitation, Guidelines Following Surgery  The results of a hip operation are greatly improved after range of motion and muscle strengthening exercises. Follow all safety measures which are given to protect your hip. If any of these exercises cause increased pain or swelling in your joint, decrease the amount until you are comfortable again. Then slowly increase the exercises. Call your caregiver if you have problems or questions.   HOME CARE INSTRUCTIONS  Remove items at home which could result in a fall. This includes throw rugs or furniture in walking pathways.   ICE to the affected hip every three hours for 30 minutes at a time and then as needed for pain and swelling.  Continue to use ice on the hip for pain and swelling from surgery. You may notice swelling that will progress down to the foot and ankle.  This is normal after surgery.  Elevate the leg when you are not up walking on it.    Continue to use the breathing machine which will help keep your temperature down.  It is common for your temperature to cycle up and down following surgery, especially at night when you are not up moving around and exerting yourself.  The breathing machine keeps your lungs expanded and your temperature down.  Do not place pillow under knee, focus on keeping the knee straight while resting  DIET You may resume your previous home diet once your are discharged from the hospital.  DRESSING / WOUND CARE / SHOWERING Please remove provena negative pressure dressing on 11/05/2017 and apply honey comb dressing. Keep dressing clean and dry at all times.  ACTIVITY Walk with your walker as instructed. Use walker as long as suggested by your caregivers. Avoid periods of inactivity such as sitting longer than an hour when not asleep. This helps prevent blood clots.  You may resume a sexual relationship in one month or when given the OK by  your doctor.  You may return to work once you are cleared by your doctor.  Do not drive a car for 6 weeks or until released by you surgeon.  Do not drive while taking narcotics.  WEIGHT BEARING Weight bearing as tolerated. Use walker/cane as needed for at least 4 weeks post op.  POSTOPERATIVE CONSTIPATION PROTOCOL Constipation - defined medically as fewer than three stools per week and severe constipation as less than one stool per week.  One of the most common issues patients have following surgery is constipation.  Even if you have a regular bowel pattern at home, your normal regimen is likely to be disrupted due to multiple reasons following surgery.  Combination of anesthesia, postoperative narcotics, change in appetite and fluid intake all can affect your bowels.  In order to avoid complications following surgery, here are some recommendations in order to help you during your recovery period.  Colace (docusate) - Pick up an over-the-counter form of Colace or another stool softener and take twice a day as long as you are requiring postoperative pain medications.  Take with a full glass of water daily.  If you experience loose stools or diarrhea, hold the colace until you stool forms back up.  If your symptoms do not get better within 1 week or if they get worse, check with your doctor.  Dulcolax (bisacodyl) - Pick up over-the-counter and take as directed by the product packaging as needed to assist with the movement of your bowels.  Take with a full  glass of water.  Use this product as needed if not relieved by Colace only.  ° °MiraLax (polyethylene glycol) - Pick up over-the-counter to have on hand.  MiraLax is a solution that will increase the amount of water in your bowels to assist with bowel movements.  Take as directed and can mix with a glass of water, juice, soda, coffee, or tea.  Take if you go more than two days without a movement. °Do not use MiraLax more than once per day. Call your  doctor if you are still constipated or irregular after using this medication for 7 days in a row. ° °If you continue to have problems with postoperative constipation, please contact the office for further assistance and recommendations.  If you experience "the worst abdominal pain ever" or develop nausea or vomiting, please contact the office immediatly for further recommendations for treatment. ° °ITCHING ° If you experience itching with your medications, try taking only a single pain pill, or even half a pain pill at a time.  You can also use Benadryl over the counter for itching or also to help with sleep.  ° °TED HOSE STOCKINGS °Wear the elastic stockings on both legs for six weeks following surgery during the day but you may remove then at night for sleeping. ° °MEDICATIONS °See your medication summary on the “After Visit Summary” that the nursing staff will review with you prior to discharge.  You may have some home medications which will be placed on hold until you complete the course of blood thinner medication.  It is important for you to complete the blood thinner medication as prescribed by your surgeon.  Continue your approved medications as instructed at time of discharge. ° °PRECAUTIONS °If you experience chest pain or shortness of breath - call 911 immediately for transfer to the hospital emergency department.  °If you develop a fever greater that 101 F, purulent drainage from wound, increased redness or drainage from wound, foul odor from the wound/dressing, or calf pain - CONTACT YOUR SURGEON.   °                                                °FOLLOW-UP APPOINTMENTS °Make sure you keep all of your appointments after your operation with your surgeon and caregivers. You should call the office at the above phone number and make an appointment for approximately two weeks after the date of your surgery or on the date instructed by your surgeon outlined in the "After Visit Summary". ° °RANGE OF MOTION  AND STRENGTHENING EXERCISES  °These exercises are designed to help you keep full movement of your hip joint. Follow your caregiver's or physical therapist's instructions. Perform all exercises about fifteen times, three times per day or as directed. Exercise both hips, even if you have had only one joint replacement. These exercises can be done on a training (exercise) mat, on the floor, on a table or on a bed. Use whatever works the best and is most comfortable for you. Use music or television while you are exercising so that the exercises are a pleasant break in your day. This will make your life better with the exercises acting as a break in routine you can look forward to.  °Lying on your back, slowly slide your foot toward your buttocks, raising your knee up off the floor. Then slowly   slide your foot back down until your leg is straight again.  Lying on your back spread your legs as far apart as you can without causing discomfort.  Lying on your side, raise your upper leg and foot straight up from the floor as far as is comfortable. Slowly lower the leg and repeat.  Lying on your back, tighten up the muscle in the front of your thigh (quadriceps muscles). You can do this by keeping your leg straight and trying to raise your heel off the floor. This helps strengthen the largest muscle supporting your knee.  Lying on your back, tighten up the muscles of your buttocks both with the legs straight and with the knee bent at a comfortable angle while keeping your heel on the floor.   IF YOU ARE TRANSFERRED TO A SKILLED REHAB FACILITY If the patient is transferred to a skilled rehab facility following release from the hospital, a list of the current medications will be sent to the facility for the patient to continue.  When discharged from the skilled rehab facility, please have the facility set up the patient's [place] prior to being released. Also, the skilled facility will be responsible  for providing the patient with their medications at time of release from the facility to include their pain medication, the muscle relaxants, and their blood thinner medication. If the patient is still at the rehab facility at time of the two week follow up appointment, the skilled rehab facility will also need to assist the patient in arranging follow up appointment in our office and any transportation needs.  MAKE SURE YOU:  Understand these instructions.  Get help right away if you are not doing well or get worse.    Pick up stool softner and laxative for home use following surgery while on pain medications. Continue to use ice for pain and swelling after surgery. Do not use any lotions or creams on the incision until instructed by your surgeon.

## 2017-10-27 NOTE — Progress Notes (Signed)
Physical Therapy Treatment Patient Details Name: Paula Moses MRN: [id number] DOB: Jun 17, 1965 Today's Date: 10/27/2017    History of Present Illness Patient is 52 yo female s/p anterior THA 10/26/17, PMH of HTN, headaches, anxiety, GERD    PT Comments    Patient alert in bed, agreeable to PT at start of session, pain is 6-7/10 in R hip. Patient needed assistance with all therapeutic exercises except isometrics and ankle pumps, complaints of increased pain. Demonstrated bed mobility with very little minAx1 for RLE, sit <> stand with CGA and RW as well as verbal/visual cues for proper technique. Patient ambulated in room with CGA and RW ~72ft, very decreased speed, heavy use of UE, step to gait pattern, and decreased stride length. Patient up in chair at end of session with all needs in reach, R hip pain 8-9/10. Nursing notified of pain levels.     Follow Up Recommendations  Home health PT;Supervision/Assistance - 24 hour     Equipment Recommendations  Rolling walker with 5" wheels    Recommendations for Other Services       Precautions / Restrictions Precautions Precautions: Anterior Hip Restrictions Weight Bearing Restrictions: Yes RLE Weight Bearing: Weight bearing as tolerated    Mobility  Bed Mobility Overal bed mobility: Needs Assistance Bed Mobility: Supine to Sit     Supine to sit: Min assist     General bed mobility comments: very minimal assist for RLE management  Transfers Overall transfer level: Needs assistance Equipment used: Rolling walker (2 wheeled) Transfers: Sit to/from Stand Sit to Stand: Min guard            Ambulation/Gait   Gait Distance (Feet): 7 Feet Assistive device: Rolling walker (2 wheeled)       General Gait Details: step to gait pattern, heavy use of UE, decreased stride length b/l, decreased speed   Stairs             Wheelchair Mobility    Modified Rankin (Stroke Patients Only)       Balance Overall balance  assessment: Needs assistance Sitting-balance support: Feet supported Sitting balance-Leahy Scale: Fair       Standing balance-Leahy Scale: Poor                              Cognition Arousal/Alertness: Awake/alert Behavior During Therapy: WFL for tasks assessed/performed Overall Cognitive Status: Within Functional Limits for tasks assessed                                        Exercises Total Joint Exercises Ankle Circles/Pumps: AROM;Both;20 reps Quad Sets: AROM;Right;15 reps Gluteal Sets: AROM;Both;15 reps Heel Slides: AAROM;Right;10 reps Hip ABduction/ADduction: AAROM;Right;10 reps Straight Leg Raises: AAROM;Right;10 reps    General Comments        Pertinent Vitals/Pain Pain Assessment: 0-10 Pain Score: 8  Pain Location: R hip Pain Descriptors / Indicators: Discomfort;Grimacing;Moaning Pain Intervention(s): Limited activity within patient's tolerance;Monitored during session;Premedicated before session;Repositioned    Home Living                      Prior Function            PT Goals (current goals can now be found in the care plan section) Progress towards PT goals: Progressing toward goals    Frequency    BID  PT Plan Current plan remains appropriate    Co-evaluation              AM-PAC PT "6 Clicks" Daily Activity  Outcome Measure  Difficulty turning over in bed (including adjusting bedclothes, sheets and blankets)?: A Little Difficulty moving from lying on back to sitting on the side of the bed? : Unable Difficulty sitting down on and standing up from a chair with arms (e.g., wheelchair, bedside commode, etc,.)?: Unable Help needed moving to and from a bed to chair (including a wheelchair)?: A Lot Help needed walking in hospital room?: A Lot Help needed climbing 3-5 steps with a railing? : A Lot 6 Click Score: 11    End of Session Equipment Utilized During Treatment: Gait belt Activity  Tolerance: Patient limited by pain Patient left: with chair alarm set;in chair;with family/visitor present;with SCD's reapplied;with call bell/phone within reach(heels elevated) Nurse Communication: Mobility status PT Visit Diagnosis: Difficulty in walking, not elsewhere classified (R26.2);Unsteadiness on feet (R26.81);Other abnormalities of gait and mobility (R26.89);Pain Pain - Right/Left: Right Pain - part of body: Hip     Time: 1610-9604 PT Time Calculation (min) (ACUTE ONLY): 38 min  Charges:  $Gait Training: 8-22 mins $Therapeutic Exercise: 8-22 mins $Therapeutic Activity: 8-22 mins                    Lieutenant Diego PT, DPT 10:39 AM,10/27/17 [phone]

## 2017-10-27 NOTE — Progress Notes (Signed)
Physical Therapy Treatment Patient Details Name: Paula Moses MRN: [id number] DOB: Mar 31, 1965 Today's Date: 10/27/2017    History of Present Illness Patient is 52 yo female s/p anterior THA 10/26/17, PMH of HTN, headaches, anxiety, GERD    PT Comments    Patient alert and agreeable to PT, stated her pain is 6-8/10 during session. Patient demonstrated improved ease with sit <> stand, CGA and RW. Ambulated with CGA, RW for ~174ft. Step to gait pattern progressed to intermittent step through, verbal/visual cues for TKE, heel strike, weight bearing on RLE throughout ambulation. Several short standing rest breaks needed, complaints of significant pain throughout mobility. Patient in chair at end of session with all needs in reach.      Follow Up Recommendations  Home health PT;Supervision/Assistance - 24 hour     Equipment Recommendations  Rolling walker with 5" wheels    Recommendations for Other Services       Precautions / Restrictions Precautions Precautions: Anterior Hip Precaution Booklet Issued: Yes (comment) Restrictions Weight Bearing Restrictions: Yes RLE Weight Bearing: Weight bearing as tolerated    Mobility  Bed Mobility Overal bed mobility: Needs Assistance Bed Mobility: Supine to Sit     Supine to sit: Min assist     General bed mobility comments: deferred, up in recliner  Transfers Overall transfer level: Needs assistance Equipment used: Rolling walker (2 wheeled) Transfers: Sit to/from Stand Sit to Stand: Min guard            Ambulation/Gait   Gait Distance (Feet): 100 Feet Assistive device: Rolling walker (2 wheeled)       General Gait Details: Step to gait pattern, able to progress to step through intermittently, cues for TKE, heel strike, weight shift, UE use throughout . Decreased speed    Stairs             Wheelchair Mobility    Modified Rankin (Stroke Patients Only)       Balance Overall balance assessment: Needs  assistance Sitting-balance support: Feet supported Sitting balance-Leahy Scale: Fair       Standing balance-Leahy Scale: Poor                              Cognition Arousal/Alertness: Awake/alert Behavior During Therapy: WFL for tasks assessed/performed Overall Cognitive Status: Within Functional Limits for tasks assessed                                        Exercises Total Joint Exercises Ankle Circles/Pumps: AROM;Both;20 reps Quad Sets: AROM;Right;15 reps Gluteal Sets: AROM;Both;15 reps Heel Slides: AAROM;Right;10 reps Hip ABduction/ADduction: AAROM;Right;10 reps Straight Leg Raises: AAROM;Right;10 reps Long Arc Quad: AROM;Right;10 reps    General Comments        Pertinent Vitals/Pain Pain Assessment: 0-10 Pain Score: 8  Pain Location: R hip Pain Descriptors / Indicators: Moaning;Grimacing;Guarding Pain Intervention(s): Repositioned;Monitored during session;Limited activity within patient's tolerance;Ice applied    Home Living                      Prior Function            PT Goals (current goals can now be found in the care plan section) Progress towards PT goals: Progressing toward goals    Frequency    BID      PT Plan Current plan remains appropriate  Co-evaluation              AM-PAC PT "6 Clicks" Daily Activity  Outcome Measure  Difficulty turning over in bed (including adjusting bedclothes, sheets and blankets)?: A Little Difficulty moving from lying on back to sitting on the side of the bed? : Unable Difficulty sitting down on and standing up from a chair with arms (e.g., wheelchair, bedside commode, etc,.)?: Unable Help needed moving to and from a bed to chair (including a wheelchair)?: A Little Help needed walking in hospital room?: A Little Help needed climbing 3-5 steps with a railing? : A Lot 6 Click Score: 13    End of Session Equipment Utilized During Treatment: Gait belt Activity  Tolerance: Patient tolerated treatment well;Patient limited by pain Patient left: with chair alarm set;in chair;with family/visitor present;with SCD's reapplied;with call bell/phone within reach Nurse Communication: Mobility status PT Visit Diagnosis: Difficulty in walking, not elsewhere classified (R26.2);Unsteadiness on feet (R26.81);Other abnormalities of gait and mobility (R26.89);Pain Pain - Right/Left: Right Pain - part of body: Hip     Time: 2707-8675 PT Time Calculation (min) (ACUTE ONLY): 41 min  Charges:  $Gait Training: 23-37 mins $Therapeutic Exercise: 8-22 mins $Therapeutic Activity: 8-22 mins                     Lieutenant Diego PT, DPT 2:11 PM,10/27/17 [phone]

## 2017-10-27 NOTE — Progress Notes (Signed)
Swelling to right leg has increased from this am to include right knee area, even though ice had been applied this late am.  Notified [place], [place]. Gerald Stabs came to room to assess, no new orders at this time.

## 2017-10-27 NOTE — Progress Notes (Signed)
   Subjective: 1 Day Post-Op Procedure(s) (LRB): TOTAL HIP ARTHROPLASTY ANTERIOR APPROACH (Right) Patient reports pain as mild.   Patient is well, and has had no acute complaints or problems Denies any CP, SOB, ABD pain. We will continue therapy today.  Plan is to go Home after hospital stay.  Objective: Vital signs in last 24 hours: Temp:  [96.6 F (35.9 C)-98.3 F (36.8 C)] 98.1 F (36.7 C) (08/16 0446) Pulse Rate:  [57-84] 75 (08/16 0446) Resp:  [10-21] 17 (08/16 0048) BP: (114-206)/(70-104) 140/77 (08/16 0446) SpO2:  [98 %-100 %] 100 % (08/16 0446) Weight:  [77.6 kg] 77.6 kg (08/15 1111)  Intake/Output from previous day: 08/15 0701 - 08/16 0700 In: 4806.7 [P.O.:360; I.V.:4246.7; IV Piggyback:200] Out: 3805 [HWTUU:8280; Drains:30; Blood:400] Intake/Output this shift: No intake/output data recorded.  Recent Labs    10/25/17 1428 10/27/17 0456  HGB 13.3 12.3   Recent Labs    10/25/17 1428 10/27/17 0456  WBC 10.1 8.1  RBC 4.26 4.08  HCT 37.2 35.5  PLT 253 231   Recent Labs    10/25/17 1428 10/27/17 0456  NA 137 140  K 3.7 3.6  CL 102 106  CO2 26 29  BUN 12 <5*  CREATININE 0.79 0.77  GLUCOSE 102* 105*  CALCIUM 9.1 8.2*   Recent Labs    10/25/17 1428  INR 0.96    EXAM General - Patient is Alert, Appropriate and Oriented Extremity - Neurovascular intact Sensation intact distally Intact pulses distally Dorsiflexion/Plantar flexion intact No cellulitis present Compartment soft Dressing - dressing C/D/I and no drainage, wound vac intact with no drainage. Hemovac intact. Motor Function - intact, moving foot and toes well on exam.   Past Medical History:  Diagnosis Date  . Abnormal Pap smear of cervix   . Abnormal vaginal bleeding   . Anxiety   . Arthritis    neck and body  . Complication of anesthesia    IT TAKES ALOT OF ANESTHESIA TO KEEP PT SEDATED  . Cyst of vulva   . Dyspareunia   . External hemorrhoid   . Fibroids   . GERD  (gastroesophageal reflux disease)   . Headache 2019   migraines, becoming more frequent  . High cholesterol   . History of UTI   . Hypertension   . Left-sided face pain   . Neck pain   . Paresthesia   . Rectocele   . Surgical menopause   . Trigeminal neuralgia   . Uterus prolapse     Assessment/Plan:   1 Day Post-Op Procedure(s) (LRB): TOTAL HIP ARTHROPLASTY ANTERIOR APPROACH (Right) Active Problems:   Status post total hip replacement, right  Estimated body mass index is 28.46 kg/m as calculated from the following:   Height as of this encounter: 5\' 5"  (1.651 m).   Weight as of this encounter: 77.6 kg. Advance diet Up with therapy  Needs BM VSS Labs stable, recheck labs in the am Pain well controlled Remove hemovac tomorrow CM to assist with discharge to home with HHPT  DVT Prophylaxis - Lovenox, TED hose and SCD Weight-Bearing as tolerated to right leg   T. Rachelle Hora, PA-C [place] 10/27/2017, 8:15 AM

## 2017-10-27 NOTE — Discharge Summary (Signed)
Physician Discharge Summary  Patient ID: Paula Moses MRN: [id number] DOB/AGE: [birth year] 52 y.o.  Admit date: 10/26/2017 Discharge date:10/30/2017 Admission Diagnoses:  PRIMARY OSTEOARTHRITIS OF RIGHT HIP   Discharge Diagnoses: Patient Active Problem List   Diagnosis Date Noted  . Status post total hip replacement, right 10/26/2017  . Femoroacetabular impingement 05/05/2017  . Surgical menopause 01/26/2017  . Status post vaginal hysterectomy BSO 01/26/2017  . Hypertension   . High cholesterol   . Fibroids   . Trigeminal neuralgia   . Left-sided face pain   . Neck pain on left side 07/16/2013  . Left facial pain 07/16/2013  . Paresthesia 07/16/2013    Past Medical History:  Diagnosis Date  . Abnormal Pap smear of cervix   . Abnormal vaginal bleeding   . Anxiety   . Arthritis    neck and body  . Complication of anesthesia    IT TAKES ALOT OF ANESTHESIA TO KEEP PT SEDATED  . Cyst of vulva   . Dyspareunia   . External hemorrhoid   . Fibroids   . GERD (gastroesophageal reflux disease)   . Headache 2019   migraines, becoming more frequent  . High cholesterol   . History of UTI   . Hypertension   . Left-sided face pain   . Neck pain   . Paresthesia   . Rectocele   . Surgical menopause   . Trigeminal neuralgia   . Uterus prolapse      Transfusion: none   Consultants (if any):   Discharged Condition: Improved  Hospital Course: Paula Moses is an 52 y.o. female who was admitted 10/26/2017 with a diagnosis of right hip osteoarthritis and went to the operating room on 10/26/2017 and underwent the above named procedures.    Surgeries: Procedure(s): TOTAL HIP ARTHROPLASTY ANTERIOR APPROACH on 10/26/2017 Patient tolerated the surgery well. Taken to PACU where she was stabilized and then transferred to the orthopedic floor.  Started on Lovenox 40 mg  q 24 hrs. Foot pumps applied bilaterally at 80 mm. Heels elevated on bed with rolled towels. No evidence of DVT.  Negative Homan. Physical therapy started on day #1 for gait training and transfer. OT started day #1 for ADL and assisted devices. Patient was An extra night secondary to inability to have a bowel movement.  Patient's foley was d/c on day #1. Patient's IV and hemovac was d/c on day #2.  On post op day #3 patient was stable and ready for discharge to home with HHPT.  Implants: Medacta AMIS O standard stem, 54 mm Mpact DM cup and liner, 28 mm S ceramic head  She was given perioperative antibiotics:  Anti-infectives (From admission, onward)   Start     Dose/Rate Route Frequency Ordered Stop   10/26/17 1330  ceFAZolin (ANCEF) IVPB 2g/100 mL premix     2 g 200 mL/hr over 30 Minutes Intravenous Every 6 hours 10/26/17 1110 10/27/17 0131   10/26/17 0629  ceFAZolin (ANCEF) 2-4 GM/100ML-% IVPB    Note to Pharmacy:  Cleatis Polka   : cabinet override      10/26/17 0629 10/26/17 0730   10/25/17 2230  ceFAZolin (ANCEF) IVPB 2g/100 mL premix     2 g 200 mL/hr over 30 Minutes Intravenous  Once 10/25/17 2223 10/26/17 0740    .  She was given sequential compression devices, early ambulation, and Lovenox for DVT prophylaxis.  She benefited maximally from the hospital stay and there were no complications.    Recent vital  signs:  Vitals:   10/27/17 0446 10/27/17 0826  BP: 140/77 (!) 159/81  Pulse: 75 83  Resp:    Temp: 98.1 F (36.7 C) 98.3 F (36.8 C)  SpO2: 100% 96%    Recent laboratory studies:  Lab Results  Component Value Date   HGB 12.3 10/27/2017   HGB 13.3 10/25/2017   HGB 8.6 (L) 11/26/2013   Lab Results  Component Value Date   WBC 8.1 10/27/2017   PLT 231 10/27/2017   Lab Results  Component Value Date   INR 0.96 10/25/2017   Lab Results  Component Value Date   NA 140 10/27/2017   K 3.6 10/27/2017   CL 106 10/27/2017   CO2 29 10/27/2017   BUN <5 (L) 10/27/2017   CREATININE 0.77 10/27/2017   GLUCOSE 105 (H) 10/27/2017    Discharge Medications:   Allergies as  of 10/27/2017      Reactions   Aspirin Shortness Of Breath   congestion   Codeine Itching   Facial itching    Percocet [oxycodone-acetaminophen]    Facial itching    Phenergan [promethazine Hcl]    Facial itching       Medication List    STOP taking these medications   meloxicam 15 MG tablet Commonly known as:  MOBIC     TAKE these medications   amLODipine 5 MG tablet Commonly known as:  NORVASC Take 5 mg by mouth daily.   docusate sodium 100 MG capsule Commonly known as:  COLACE Take 100 mg by mouth every evening.   DULCOLAX 5 MG EC tablet Generic drug:  bisacodyl Take 5 mg by mouth every evening.   enoxaparin 40 MG/0.4ML injection Commonly known as:  LOVENOX Inject 0.4 mLs (40 mg total) into the skin daily for 14 days. Start taking on:  10/28/2017   estradiol 2 MG tablet Commonly known as:  ESTRACE Take 2 mg by mouth at bedtime.   HAIR/SKIN/NAILS PO Take 2 tablets by mouth every evening.   HYDROcodone-acetaminophen 7.5-325 MG tablet Commonly known as:  NORCO Take 1-2 tablets by mouth every 4 (four) hours as needed for moderate pain.   JUICE PLUS FIBRE PO Take 4 tablets by mouth 2 (two) times daily. Take 2 tablets of Fruit Blend + 2 tablets of  Vegetable Blend twice daily   montelukast 10 MG tablet Commonly known as:  SINGULAIR Take 10 mg by mouth at bedtime.   omeprazole 20 MG tablet Commonly known as:  PRILOSEC OTC Take 20 mg by mouth daily as needed (acid reflux).   ondansetron 4 MG tablet Commonly known as:  ZOFRAN Take 1 tablet (4 mg total) by mouth every 6 (six) hours as needed for nausea.   OVER THE COUNTER MEDICATION Take 3 drops by mouth at bedtime. cbd oil (3 dropperfuls)   phentermine 37.5 MG tablet Commonly known as:  ADIPEX-P Take 37.5 mg by mouth daily.   rizatriptan 5 MG tablet Commonly known as:  MAXALT Take 5 mg by mouth every 2 (two) hours as needed for migraine.   tiZANidine 4 MG tablet Commonly known as:  ZANAFLEX Take 1-2  tablets (4-8 mg total) by mouth every 6 (six) hours as needed for muscle spasms.   Turmeric Curcumin 500 MG Caps Take 500 mg by mouth every evening.            Durable Medical Equipment  (From admission, onward)         Start     Ordered   10/26/17  75  DME 3 n 1  Once     10/26/17 1110   10/26/17 1111  DME Bedside commode  Once    Question:  Patient needs a bedside commode to treat with the following condition  Answer:  Status post total hip replacement, right   10/26/17 1110   10/26/17 1111  DME Walker rolling  Once    Question:  Patient needs a walker to treat with the following condition  Answer:  Status post total hip replacement, right   10/26/17 1110          Diagnostic Studies: Dg Hip Operative Unilat W Or W/o Pelvis Right  Result Date: 10/26/2017 CLINICAL DATA:  Right hip replacement. EXAM: OPERATIVE RIGHT HIP (WITH PELVIS IF PERFORMED) 5 VIEWS TECHNIQUE: Fluoroscopic spot image(s) were submitted for interpretation post-operatively. COMPARISON:  CT 03/30/2017. FINDINGS: Total right hip replacement with anatomic alignment. Hardware intact. 0 minutes 30 seconds fluoroscopy time. IMPRESSION: Total right hip replacement anatomic alignment. Hardware intact. No acute bony abnormality Electronically Signed   By: Marcello Moores  Register   On: 10/26/2017 09:42   Dg Hip Unilat W Or W/o Pelvis 2-3 Views Right  Result Date: 10/26/2017 CLINICAL DATA:  Postop day 0 RIGHT total hip arthroplasty, ANTERIOR approach. EXAM: DG HIP (WITH OR WITHOUT PELVIS) 2-3V RIGHT COMPARISON:  CT pelvis 03/30/2017. Intraoperative images at the time of RIGHT hip arthroscopy 05/05/2017. FINDINGS: Anatomic alignment post RIGHT total hip arthroplasty. No acute complicating features. Surgical drain in place. IMPRESSION: Anatomic alignment post RIGHT total hip arthroplasty without acute complicating features. Electronically Signed   By: Evangeline Dakin M.D.   On: 10/26/2017 10:32    Disposition:     Follow-up  Information    Duanne Guess, PA-C Follow up in 2 week(s).   Specialties:  Orthopedic Surgery, Emergency Medicine Contact information: [place] [address] [phone]            Signed: Feliberto Gottron 10/27/2017, 11:56 AM

## 2017-10-27 NOTE — Progress Notes (Signed)
PT Cancellation Note  Patient Details Name: Paula Moses MRN: [id number] DOB: February 17, 1966   Cancelled Treatment:    Reason Eval/Treat Not Completed: Pain limiting ability to participate;Other (comment)(Patient reports that she just took pain medication and is trying to start breakfast. PT will re attempt this AM.)  Lieutenant Diego PT, DPT 9:03 AM,10/27/17 [phone]

## 2017-10-27 NOTE — Care Management (Signed)
Patient will not have any cost for her lovenox.  Insurance will cover it all

## 2017-10-28 LAB — BASIC METABOLIC PANEL
Anion gap: 5 (ref 5–15)
BUN: 6 mg/dL (ref 6–20)
CHLORIDE: 103 mmol/L (ref 98–111)
CO2: 30 mmol/L (ref 22–32)
CREATININE: 0.76 mg/dL (ref 0.44–1.00)
Calcium: 8.3 mg/dL — ABNORMAL LOW (ref 8.9–10.3)
GFR calc non Af Amer: >60 mL/min (ref >60)
Glucose, Bld: 118 mg/dL — ABNORMAL HIGH (ref 70–99)
Potassium: 3.9 mmol/L (ref 3.5–5.1)
SODIUM: 138 mmol/L (ref 135–145)

## 2017-10-28 LAB — CBC
HCT: 34.3 % — ABNORMAL LOW (ref 35.0–47.0)
HEMOGLOBIN: 11.9 g/dL — AB (ref 12.0–16.0)
MCH: 30.4 pg (ref 26.0–34.0)
MCHC: 34.7 g/dL (ref 32.0–36.0)
MCV: 87.6 fL (ref 80.0–100.0)
Platelets: 233 10*3/uL (ref 150–440)
RBC: 3.92 MIL/uL (ref 3.80–5.20)
RDW: 13.2 % (ref 11.5–14.5)
WBC: 11.2 10*3/uL — ABNORMAL HIGH (ref 3.6–11.0)

## 2017-10-28 MED ORDER — FLEET ENEMA 7-19 GM/118ML RE ENEM: 1.0000 | ENEMA | Freq: Every day | RECTAL | Status: DC | PRN

## 2017-10-28 MED ORDER — LACTULOSE 10 GM/15ML PO SOLN
10.0000 g | Freq: Two times a day (BID) | ORAL | Status: DC
  Administered 2017-10-28: 10 g via ORAL
  Filled 2017-10-28: qty 30

## 2017-10-28 MED ORDER — TRAMADOL HCL 50 MG PO TABS: 50.0000 mg | ORAL_TABLET | Freq: Four times a day (QID) | ORAL | Status: DC | PRN

## 2017-10-28 MED ORDER — BISACODYL 10 MG RE SUPP
10.0000 mg | Freq: Every day | RECTAL | Status: DC | PRN
  Administered 2017-10-28: 10 mg via RECTAL
  Filled 2017-10-28: qty 1

## 2017-10-28 NOTE — Progress Notes (Addendum)
Physical Therapy Treatment Patient Details Name: Paula Moses MRN: [id number] DOB: 07-Feb-1966 Today's Date: 10/28/2017    History of Present Illness Patient is 52 yo female s/p anterior THA 10/26/17, PMH of HTN, headaches, anxiety, GERD    PT Comments    Pt making excellent progress with therapy. Pt demonstrates safe hand placement and good stability in standing with transfers while using a rolling walker. She demonstrates safe sequencing with rolling walker during ambulation. Progresses to step through pattern. She requires one standing rest break once she reaches the rehab gym. She is able to complete a full lap around the RN station. Pt able to ascend/descend 4 steps with education about proper sequencing. She demonstrates good safety with stairs and no LE buckling noted. Pt is safe to discharge home when medically appropriate. She will need HH PT. Pt will benefit from PT services to address deficits in strength, balance, and mobility in order to return to full function at home. RN notified of R thigh swelling.    Follow Up Recommendations  Home health PT;Supervision for mobility/OOB     Equipment Recommendations  Rolling walker with 5" wheels    Recommendations for Other Services       Precautions / Restrictions Precautions Precautions: Anterior Hip Precaution Booklet Issued: Yes (comment) Restrictions Weight Bearing Restrictions: Yes RLE Weight Bearing: Weight bearing as tolerated    Mobility  Bed Mobility               General bed mobility comments: Received and left upright in recliner  Transfers Overall transfer level: Needs assistance Equipment used: Rolling walker (2 wheeled) Transfers: Sit to/from Stand Sit to Stand: Min guard         General transfer comment: Pt demonstrates safe hand placement and good stability in standing  Ambulation/Gait Ambulation/Gait assistance: Min guard Gait Distance (Feet): 250 Feet Assistive device: Rolling walker (2  wheeled)       General Gait Details: Pt demonstrates safe sequencing with rolling walker. Progresses to step through pattern. Pt is steady in standing with use of rolling walker. She requires one standing rest break once she reaches the rehab gym   Stairs Stairs: Yes Stairs assistance: Min guard Stair Management: Two rails;Step to pattern Number of Stairs: 4 General stair comments: Pt able to ascend/descend 4 steps with education about proper sequencing. She demonstrates good safety with stairs and no LE buckling noted   Wheelchair Mobility    Modified Rankin (Stroke Patients Only)       Balance Overall balance assessment: Needs assistance Sitting-balance support: Feet supported Sitting balance-Leahy Scale: Fair     Standing balance support: Bilateral upper extremity supported Standing balance-Leahy Scale: Fair                              Cognition Arousal/Alertness: Awake/alert Behavior During Therapy: WFL for tasks assessed/performed Overall Cognitive Status: Within Functional Limits for tasks assessed                                        Exercises Total Joint Exercises Heel Slides: Right;15 reps;Seated Hip ABduction/ADduction: Both;15 reps;Seated Long Arc Quad: Right;15 reps;Seated Knee Flexion: Right;15 reps;Seated Marching in Standing: Right;10 reps;Seated    General Comments        Pertinent Vitals/Pain Pain Assessment: 0-10 Pain Score: 8  Pain Location: R hip Pain Descriptors /  Indicators: Moaning;Grimacing;Guarding;Burning Pain Intervention(s): Monitored during session;Premedicated before session    Home Living                      Prior Function            PT Goals (current goals can now be found in the care plan section) Acute Rehab PT Goals Patient Stated Goal: Patient wants pain to be controlled PT Goal Formulation: With patient Time For Goal Achievement: 11/09/17 Potential to Achieve Goals:  Good Progress towards PT goals: Progressing toward goals    Frequency    BID      PT Plan Current plan remains appropriate    Co-evaluation              AM-PAC PT "6 Clicks" Daily Activity  Outcome Measure  Difficulty turning over in bed (including adjusting bedclothes, sheets and blankets)?: A Little Difficulty moving from lying on back to sitting on the side of the bed? : Unable Difficulty sitting down on and standing up from a chair with arms (e.g., wheelchair, bedside commode, etc,.)?: Unable Help needed moving to and from a bed to chair (including a wheelchair)?: A Little Help needed walking in hospital room?: A Little Help needed climbing 3-5 steps with a railing? : A Little 6 Click Score: 14    End of Session Equipment Utilized During Treatment: Gait belt Activity Tolerance: Patient tolerated treatment well Patient left: with chair alarm set;in chair;with family/visitor present;with SCD's reapplied;with call bell/phone within reach;Other (comment)(ice pack on hip) Nurse Communication: Other (comment)(R thigh swelling) PT Visit Diagnosis: Difficulty in walking, not elsewhere classified (R26.2);Unsteadiness on feet (R26.81);Other abnormalities of gait and mobility (R26.89);Pain Pain - Right/Left: Right Pain - part of body: Hip     Time: 1341-1420 PT Time Calculation (min) (ACUTE ONLY): 39 min  Charges:  $Gait Training: 8-22 mins $Therapeutic Exercise: 8-22 mins                     Paula Moses PT, DPT, GCS    Paula Moses 10/28/2017, 3:43 PM

## 2017-10-28 NOTE — Discharge Planning (Addendum)
Patient still waiting to have BM. Has been given senokot, lactulose, Mag citrate and ambulate x3 times. Patient states she has chronic issues with constipation and has to take fiber and laxatives at home.

## 2017-10-28 NOTE — Progress Notes (Signed)
Physical Therapy Treatment Patient Details Name: Paula Moses MRN: [id number] DOB: 1965/05/20 Today's Date: 10/28/2017    History of Present Illness Patient is 52 yo female s/p anterior THA 10/26/17, PMH of HTN, headaches, anxiety, GERD    PT Comments    Pt requested pain medication prior to session.  Discussed with nursing and returned later.  Stood with min guard and pt was able to increase ambulation to a full lap this am.  Verbal cues at times for gait pattern and walker position but overall does well.  Heavy reliance on UE's and c/o hands being sore from pressure.  Participated in exercises as described below.  Overall tolerated well and no further questions prior to discharge.  Reported she is going to her mom's house and has no stairs to enter/exit.  Deferred training at this time.   Follow Up Recommendations  Home health PT;Supervision for mobility/OOB     Equipment Recommendations  Rolling walker with 5" wheels    Recommendations for Other Services       Precautions / Restrictions Precautions Precautions: Anterior Hip Precaution Booklet Issued: Yes (comment) Restrictions Weight Bearing Restrictions: Yes RLE Weight Bearing: Weight bearing as tolerated    Mobility  Bed Mobility               General bed mobility comments: deferred, up in recliner  Transfers Overall transfer level: Needs assistance Equipment used: Rolling walker (2 wheeled) Transfers: Sit to/from Stand Sit to Stand: Min guard            Ambulation/Gait Ambulation/Gait assistance: Min guard Gait Distance (Feet): 200 Feet Assistive device: Rolling walker (2 wheeled) Gait Pattern/deviations: Step-through pattern Gait velocity: decreased   General Gait Details: Verbal cues for walker use safety.   Stairs             Wheelchair Mobility    Modified Rankin (Stroke Patients Only)       Balance Overall balance assessment: Needs assistance Sitting-balance support: Feet  supported Sitting balance-Leahy Scale: Fair       Standing balance-Leahy Scale: Fair                              Cognition Arousal/Alertness: Awake/alert Behavior During Therapy: WFL for tasks assessed/performed Overall Cognitive Status: Within Functional Limits for tasks assessed                                        Exercises Total Joint Exercises Towel Squeeze: AROM;Both;15 reps Straight Leg Raises: AROM;10 reps;Right;Standing Long Arc Quad: Right;10 reps;AAROM Knee Flexion: AAROM;Right;10 reps Marching in Standing: AROM;Right;10 reps    General Comments        Pertinent Vitals/Pain Pain Assessment: 0-10 Pain Score: 8  Pain Location: R hip Pain Descriptors / Indicators: Moaning;Grimacing;Guarding;Burning Pain Intervention(s): Limited activity within patient's tolerance;Monitored during session;Premedicated before session    Home Living                      Prior Function            PT Goals (current goals can now be found in the care plan section) Progress towards PT goals: Progressing toward goals    Frequency    BID      PT Plan Current plan remains appropriate    Co-evaluation  AM-PAC PT "6 Clicks" Daily Activity  Outcome Measure  Difficulty turning over in bed (including adjusting bedclothes, sheets and blankets)?: A Little Difficulty moving from lying on back to sitting on the side of the bed? : Unable Difficulty sitting down on and standing up from a chair with arms (e.g., wheelchair, bedside commode, etc,.)?: Unable Help needed moving to and from a bed to chair (including a wheelchair)?: A Little Help needed walking in hospital room?: A Little Help needed climbing 3-5 steps with a railing? : A Lot 6 Click Score: 13    End of Session Equipment Utilized During Treatment: Gait belt Activity Tolerance: Patient tolerated treatment well;Patient limited by pain Patient left: with chair  alarm set;in chair;with family/visitor present;with SCD's reapplied;with call bell/phone within reach Nurse Communication: Patient requests pain meds Pain - Right/Left: Right Pain - part of body: Hip     Time: 8335-8251 PT Time Calculation (min) (ACUTE ONLY): 25 min  Charges:  $Gait Training: 8-22 mins $Therapeutic Exercise: 8-22 mins                     Chesley Noon, PTA 10/28/17, 10:11 AM

## 2017-10-28 NOTE — Progress Notes (Signed)
   Subjective: 2 Days Post-Op Procedure(s) (LRB): TOTAL HIP ARTHROPLASTY ANTERIOR APPROACH (Right) Patient reports pain as 7 on 0-10 scale.  Still expressing quite a bit discomfort. Patient is well, and has had no acute complaints or problems Patient did well with physical therapy yesterday.  Was able to ambulate 100 feet. Plan is to go Home after hospital stay. no nausea and no vomiting Patient denies any chest pains or shortness of breath. Ice pack to right thigh  Objective: Vital signs in last 24 hours: Temp:  [97.7 F (36.5 C)-98.3 F (36.8 C)] 97.7 F (36.5 C) (08/16 2348) Pulse Rate:  [70-83] 70 (08/16 2348) Resp:  [17-18] 17 (08/16 2348) BP: (121-159)/(79-81) 144/79 (08/16 2348) SpO2:  [96 %-100 %] 100 % (08/16 2348) Wound VAC in place Heels are non tender and elevated off the bed using rolled towels Still has a lot of swelling to the right thigh and is tender to palpation.  Intake/Output from previous day: 08/16 0701 - 08/17 0700 In: 1845 [P.O.:480; I.V.:1365] Out: 70 [Drains:70] Intake/Output this shift: No intake/output data recorded.  Recent Labs    10/25/17 1428 10/27/17 0456 10/28/17 0559  HGB 13.3 12.3 11.9*   Recent Labs    10/27/17 0456 10/28/17 0559  WBC 8.1 11.2*  RBC 4.08 3.92  HCT 35.5 34.3*  PLT 231 233   Recent Labs    10/27/17 0456 10/28/17 0559  NA 140 138  K 3.6 3.9  CL 106 103  CO2 29 30  BUN <5* 6  CREATININE 0.77 0.76  GLUCOSE 105* 118*  CALCIUM 8.2* 8.3*   Recent Labs    10/25/17 1428  INR 0.96    EXAM General - Patient is Alert, Appropriate and Oriented Extremity - Neurologically intact Neurovascular intact Sensation intact distally Intact pulses distally No cellulitis present Compartment soft Dressing - dressing C/D/I Motor Function - intact, moving foot and toes well on exam.    Past Medical History:  Diagnosis Date  . Abnormal Pap smear of cervix   . Abnormal vaginal bleeding   . Anxiety   . Arthritis     neck and body  . Complication of anesthesia    IT TAKES ALOT OF ANESTHESIA TO KEEP PT SEDATED  . Cyst of vulva   . Dyspareunia   . External hemorrhoid   . Fibroids   . GERD (gastroesophageal reflux disease)   . Headache 2019   migraines, becoming more frequent  . High cholesterol   . History of UTI   . Hypertension   . Left-sided face pain   . Neck pain   . Paresthesia   . Rectocele   . Surgical menopause   . Trigeminal neuralgia   . Uterus prolapse     Assessment/Plan: 2 Days Post-Op Procedure(s) (LRB): TOTAL HIP ARTHROPLASTY ANTERIOR APPROACH (Right) Active Problems:   Status post total hip replacement, right  Estimated body mass index is 28.46 kg/m as calculated from the following:   Height as of this encounter: 5\' 5"  (1.651 m).   Weight as of this encounter: 77.6 kg. Up with therapy Discharge home with home health  Labs: Hemoglobin 11.9.  Pain and labs were reviewed DVT Prophylaxis - Lovenox, TED hose and Sequential leg wraps Weight-Bearing as tolerated to right leg Patient needs bowel movement today Hemovac removed today.  Into the drains appear to be intact.  Jillyn Ledger. [place] 10/28/2017, 7:58 AM

## 2017-10-28 NOTE — Progress Notes (Signed)
Spoke with Dr. Roland Rack pt has not been able to move bowels this evening. Does not want to take dose of lactulose tonight. Per dr. Roland Rack may discontinue discharge order and order byscodal suppository and fleet enema prn.

## 2017-10-28 NOTE — Care Management Note (Addendum)
Case Management Note  Patient Details  Name: Paula Moses MRN: [id number] Date of Birth: October 04, 1965  Subjective/Objective:   Patient to be discharged per MD order. Orders in place for home health services. Patient previously worked up for home health services. Referral placed with Helene Kelp from [facility] who accepted patient for PT services. Patient has a walker she was able to borrow. Family to transport at discharge.  Ines Bloomer RN BSN RNCM [phone]                   Action/Plan:   Expected Discharge Date:  10/28/17               Expected Discharge Plan:  [place]  In-House Referral:     Discharge planning Services  CM Consult  Post Acute Care Choice:  Home Health Choice offered to:  Patient  DME Arranged:    DME Agency:     HH Arranged:  PT [place]:  [facility] (formerly [employer])  Status of Service:  Completed, signed off  If discussed at [employer], dates discussed:    Additional Comments:  Latanya Maudlin, RN 10/28/2017, 8:35 AM

## 2017-10-28 NOTE — Progress Notes (Signed)
Patient has not had a bowel movement at change of shift.

## 2017-10-29 NOTE — Progress Notes (Signed)
   Subjective: 3 Days Post-Op Procedure(s) (LRB): TOTAL HIP ARTHROPLASTY ANTERIOR APPROACH (Right) Patient reports pain as moderate.  Somewhat better than yesterday. Patient is well, and has had no acute complaints or problems Patient did very well with physical therapy yesterday.  Was able to ambulate around the nurses test and the stairs. Plan is to go Home after hospital stay. no nausea and no vomiting Patient denies any chest pains or shortness of breath. Has noticed some blistering around the edges of the dressing from the wound VAC. Objective: Vital signs in last 24 hours: Temp:  [97.4 F (36.3 C)-98.6 F (37 C)] 98.6 F (37 C) (08/18 0755) Pulse Rate:  [74-85] 79 (08/18 0755) Resp:  [13-18] 17 (08/18 0755) BP: (138-179)/(74-83) 139/74 (08/18 0755) SpO2:  [97 %-98 %] 97 % (08/18 0755) well approximated incision Heels are non tender and elevated off the bed using rolled towels Intake/Output from previous day: 08/17 0701 - 08/18 0700 In: 240 [P.O.:240] Out: -  Intake/Output this shift: No intake/output data recorded.  Recent Labs    10/27/17 0456 10/28/17 0559  HGB 12.3 11.9*   Recent Labs    10/27/17 0456 10/28/17 0559  WBC 8.1 11.2*  RBC 4.08 3.92  HCT 35.5 34.3*  PLT 231 233   Recent Labs    10/27/17 0456 10/28/17 0559  NA 140 138  K 3.6 3.9  CL 106 103  CO2 29 30  BUN <5* 6  CREATININE 0.77 0.76  GLUCOSE 105* 118*  CALCIUM 8.2* 8.3*   No results for input(s): LABPT, INR in the last 72 hours.  EXAM General - Patient is Alert, Appropriate and Oriented Extremity - Neurologically intact Neurovascular intact Sensation intact distally Intact pulses distally Dorsiflexion/Plantar flexion intact No cellulitis present Compartment soft Dressing - dressing C/D/I Motor Function - intact, moving foot and toes well on exam.    Past Medical History:  Diagnosis Date  . Abnormal Pap smear of cervix   . Abnormal vaginal bleeding   . Anxiety   .  Arthritis    neck and body  . Complication of anesthesia    IT TAKES ALOT OF ANESTHESIA TO KEEP PT SEDATED  . Cyst of vulva   . Dyspareunia   . External hemorrhoid   . Fibroids   . GERD (gastroesophageal reflux disease)   . Headache 2019   migraines, becoming more frequent  . High cholesterol   . History of UTI   . Hypertension   . Left-sided face pain   . Neck pain   . Paresthesia   . Rectocele   . Surgical menopause   . Trigeminal neuralgia   . Uterus prolapse     Assessment/Plan: 3 Days Post-Op Procedure(s) (LRB): TOTAL HIP ARTHROPLASTY ANTERIOR APPROACH (Right) Active Problems:   Status post total hip replacement, right  Estimated body mass index is 28.46 kg/m as calculated from the following:   Height as of this encounter: 5\' 5"  (1.651 m).   Weight as of this encounter: 77.6 kg. Up with therapy Discharge home with home health  Labs: None DVT Prophylaxis - Lovenox, Foot Pumps and TED hose Weight-Bearing as tolerated to right leg We will discontinue wound VAC since there is no drainage. Please change dressing prior to patient being discharged and give the patient 2 extra honeycomb dressings to take home  [facility] [place] 10/29/2017, 8:01 AM

## 2017-10-29 NOTE — Progress Notes (Signed)
Pt up and ambulating around room last night. Pt was able to move bowels after suppository. No drainage to wound vac this shift. Medicated for pain with Tramadol last night. Pt able to get sleep in between care.

## 2017-10-29 NOTE — Progress Notes (Signed)
Physical Therapy Treatment Patient Details Name: Paula Moses MRN: [id number] DOB: 15-Oct-1965 Today's Date: 10/29/2017    History of Present Illness Patient is 52 yo female s/p anterior THA 10/26/17, PMH of HTN, headaches, anxiety, GERD    PT Comments    Pt continues making excellent progress with therapy. Pt demonstrates continued improvement with safety and stability in standing during transfers. R thigh swelling improved today. Pt demonstrates safe sequencing with rolling walker. Progresses to step through pattern. She is able to ambulate to rehab gym and then complete a lap around RN station. No rest breaks required during ambulation today and pt reports less pain than during previous days. Pt able to ascend/descend 4 steps twice with cues again for proper sequencing. She demonstrates good safety with stairs and no LE buckling noted. During first attempt pt uses bilateral rails but during second attempt pt uses L rail only to simulate home environment. Pt is safe to discharge home when medically appropriate. She will need HH PT and support from family. Pt will benefit from PT services to address deficits in strength, balance, and mobility in order to return to full function at home.     Follow Up Recommendations  Home health PT;Supervision for mobility/OOB     Equipment Recommendations  Rolling walker with 5" wheels    Recommendations for Other Services       Precautions / Restrictions Precautions Precautions: Anterior Hip Precaution Booklet Issued: Yes (comment) Restrictions Weight Bearing Restrictions: Yes RLE Weight Bearing: Weight bearing as tolerated    Mobility  Bed Mobility Overal bed mobility: Needs Assistance Bed Mobility: Supine to Sit     Supine to sit: Min assist     General bed mobility comments: Pt requires minA+1 assist for RLE when exiting bed on the L side.   Transfers Overall transfer level: Needs assistance Equipment used: Rolling walker (2  wheeled) Transfers: Sit to/from Stand Sit to Stand: Min guard         General transfer comment: Continued improvement with strength and stability during transfers  Ambulation/Gait Ambulation/Gait assistance: Min guard Gait Distance (Feet): 250 Feet Assistive device: Rolling walker (2 wheeled) Gait Pattern/deviations: Step-through pattern Gait velocity: decreased   General Gait Details: Pt demonstrates safe sequencing with rolling walker. Progresses to step through pattern. She is able to ambulate to rehab gym and then complete a lap around RN station. No rest breaks required during ambulation today and pt reports less pain than previous days.   Stairs Stairs: Yes Stairs assistance: Min guard Stair Management: Two rails;Step to pattern Number of Stairs: 8(4+4) General stair comments: Pt able to ascend/descend 4 steps twice with cues again for proper sequencing. She demonstrates good safety with stairs and no LE buckling noted. During first attempt pt uses bilateral rails but during second attempt used L rail only to simulate home environment.   Wheelchair Mobility    Modified Rankin (Stroke Patients Only)       Balance Overall balance assessment: Needs assistance Sitting-balance support: Feet supported Sitting balance-Leahy Scale: Fair     Standing balance support: Bilateral upper extremity supported Standing balance-Leahy Scale: Fair                              Cognition Arousal/Alertness: Awake/alert Behavior During Therapy: WFL for tasks assessed/performed Overall Cognitive Status: Within Functional Limits for tasks assessed  Exercises Total Joint Exercises Hip ABduction/ADduction: Both;15 reps;Seated Long Arc Quad: Right;15 reps;Seated Knee Flexion: Right;15 reps;Seated Marching in Standing: Both;15 reps;Seated    General Comments        Pertinent Vitals/Pain Pain Assessment:  0-10 Pain Score: 5  Pain Location: R hip Pain Descriptors / Indicators: Operative site guarding Pain Intervention(s): Monitored during session;Premedicated before session    Home Living                      Prior Function            PT Goals (current goals can now be found in the care plan section) Acute Rehab PT Goals Patient Stated Goal: Patient wants pain to be controlled PT Goal Formulation: With patient Time For Goal Achievement: 11/09/17 Potential to Achieve Goals: Good Progress towards PT goals: Progressing toward goals    Frequency    BID      PT Plan Current plan remains appropriate    Co-evaluation              AM-PAC PT "6 Clicks" Daily Activity  Outcome Measure  Difficulty turning over in bed (including adjusting bedclothes, sheets and blankets)?: A Little Difficulty moving from lying on back to sitting on the side of the bed? : Unable Difficulty sitting down on and standing up from a chair with arms (e.g., wheelchair, bedside commode, etc,.)?: A Little Help needed moving to and from a bed to chair (including a wheelchair)?: A Little Help needed walking in hospital room?: A Little Help needed climbing 3-5 steps with a railing? : A Little 6 Click Score: 16    End of Session Equipment Utilized During Treatment: Gait belt Activity Tolerance: Patient tolerated treatment well Patient left: in bed;with call bell/phone within reach;with family/visitor present   PT Visit Diagnosis: Difficulty in walking, not elsewhere classified (R26.2);Unsteadiness on feet (R26.81);Other abnormalities of gait and mobility (R26.89);Pain Pain - Right/Left: Right Pain - part of body: Hip     Time: 3785-8850 PT Time Calculation (min) (ACUTE ONLY): 23 min  Charges:  $Gait Training: 8-22 mins $Therapeutic Exercise: 8-22 mins                     Paula Moses D Paula Moses PT, DPT, GCS    Paula Moses 10/29/2017, 11:23 AM

## 2017-10-29 NOTE — Plan of Care (Signed)
  Problem: Activity: Goal: Risk for activity intolerance will decrease Outcome: Progressing   Problem: Pain Managment: Goal: General experience of comfort will improve Outcome: Progressing   Problem: Safety: Goal: Ability to remain free from injury will improve Outcome: Progressing   Problem: Skin Integrity: Goal: Risk for impaired skin integrity will decrease Outcome: Progressing   Problem: Activity: Goal: Ability to avoid complications of mobility impairment will improve Outcome: Progressing Goal: Ability to tolerate increased activity will improve Outcome: Progressing   Problem: Pain Management: Goal: Pain level will decrease with appropriate interventions Outcome: Progressing   Problem: Skin Integrity: Goal: Will show signs of wound healing Outcome: Progressing

## 2017-10-29 NOTE — Progress Notes (Signed)
Patient is being discharged to home with HH/PT. Husband is bedside to take home. Belongings packed. DC & RX instructions given and patient acknowledged understanding. IV removed.  Patient will work with PT before discharging and then will leave for home.

## 2017-10-30 LAB — SURGICAL PATHOLOGY

## 2017-11-01 ENCOUNTER — Telehealth: Payer: Self-pay

## 2017-11-01 NOTE — Telephone Encounter (Signed)
Follow up Noted on report that patient has not reviewed discharge instructions.  Patient stated she has been able to review them since the EMMI call.  Follow up appointment is scheduled for Aug. 30th.  Pt. Asked for the number to [facility] which was provided.  Pt. Notified to expect another automated call in a couple of days.  No further needs noted.

## 2017-11-21 ENCOUNTER — Other Ambulatory Visit: Payer: Self-pay | Admitting: Orthopedic Surgery

## 2017-11-21 DIAGNOSIS — M5441 Lumbago with sciatica, right side: Secondary | ICD-10-CM

## 2017-12-03 ENCOUNTER — Ambulatory Visit: Payer: BLUE CROSS/BLUE SHIELD

## 2017-12-18 NOTE — Progress Notes (Signed)
[facility] [address] [place], [place] [address] Phone: [phone] Subjective:    I Kandace Blitz am serving as a Education administrator for Dr. Hulan Saas.   I'm seeing this patient by the request  of:  Marinda Elk, MD   CC: Back and hip pain  JOA:[id number]  Paula Moses is a 52 y.o. female coming in with complaint of back and hip pain. Has had her right hip replaced. Wants a second opinion. Knows she has arthritis and spinal stenosis. Hip pain is on the left side and it feels similar to the right sided pain she had. Surgery was in February. Has MRI. Has had epidural for back pain. Has also had hip injections.   Patient states that the pain seems to be unrelenting.  Patient did get the hip replaced because she was unable to even walk.  Patient is concerned because she just came back from a trip from [place] and had significant difficulty on the left side of the hip.  Seems to be in the groin area.  Mild radiation down the legs bilaterally.  Patient is here for a second opinion.  Wondering if she should have the left hip replaced this year.  Patient does not understand why she continues to have all the aches and pains and is having difficulty even doing regular activity.   Reviewing patient's history patient did have a hip replacement done on October 26, 2017 Patient did have an MRI of the right hip done on March 27, 2017.  Apparently visualized by me.  Showed the patient did have degenerative disc disease from L4-S1.  Moderate severe foraminal stenosis at L4-L5 pelvis MRI in February of this year also independently visualized by me showing moderate arthritic changes of the left hip as well.  Past Medical History:  Diagnosis Date  . Abnormal Pap smear of cervix   . Abnormal vaginal bleeding   . Anxiety   . Arthritis    neck and body  . Complication of anesthesia    IT TAKES ALOT OF ANESTHESIA TO KEEP PT SEDATED  . Cyst of vulva   . Dyspareunia   .  External hemorrhoid   . Fibroids   . GERD (gastroesophageal reflux disease)   . Headache 2019   migraines, becoming more frequent  . High cholesterol   . History of UTI   . Hypertension   . Left-sided face pain   . Neck pain   . Paresthesia   . Rectocele   . Surgical menopause   . Trigeminal neuralgia   . Uterus prolapse    Past Surgical History:  Procedure Laterality Date  . AUGMENTATION MAMMAPLASTY Bilateral 2006  . BREAST ENHANCEMENT SURGERY    . CHOLECYSTECTOMY    . HIP ARTHROSCOPY Right 05/05/2017   Procedure: ARTHROSCOPY HIP;  Surgeon: Leim Fabry, MD;  Location: [facility] ORS;  Service: Orthopedics;  Laterality: Right;  . LAPAROSCOPY     X2  . TOTAL HIP ARTHROPLASTY Right 10/26/2017   Procedure: TOTAL HIP ARTHROPLASTY ANTERIOR APPROACH;  Surgeon: [name], MD;  Location: [facility] ORS;  Service: Orthopedics;  Laterality: Right;  . tummy tuck     . VAGINAL HYSTERECTOMY     lahv bso   Social History   Socioeconomic History  . Marital status: Married    Spouse name: Not on file  . Number of children: 4  . Years of education: BA  . Highest education level: Not on file  Occupational History  .  Occupation: Cabin crew  Social Needs  . Financial resource strain: Not on file  . Food insecurity:    Worry: Not on file    Inability: Not on file  . Transportation needs:    Medical: Not on file    Non-medical: Not on file  Tobacco Use  . Smoking status: Never Smoker  . Smokeless tobacco: Never Used  Substance and Sexual Activity  . Alcohol use: Yes    Comment: 2 glasses  . Drug use: No  . Sexual activity: Yes    Birth control/protection: Surgical  Lifestyle  . Physical activity:    Days per week: Not on file    Minutes per session: Not on file  . Stress: To some extent  Relationships  . Social connections:    Talks on phone: Not on file    Gets together: Not on file    Attends religious service: Not on file    Active member of club or organization: Not on file     Attends meetings of clubs or organizations: Not on file    Relationship status: Not on file  Other Topics Concern  . Not on file  Social History Narrative   Patient lives at home at home with her Husband [name]) Patient works full time Cabin crew full time .   Education college.   Right handed.   Caffeine one cup of coffee daily.   Allergies  Allergen Reactions  . Aspirin Shortness Of Breath    congestion  . Codeine Itching    Facial itching   . Percocet [Oxycodone-Acetaminophen]     Facial itching   . Phenergan [Promethazine Hcl]     Facial itching    Family History  Problem Relation Age of Onset  . Brain cancer Mother   . Dementia Father   . Breast cancer Neg Hx     Current Outpatient Medications (Endocrine & Metabolic):  .  estradiol (ESTRACE) 2 MG tablet, Take 2 mg by mouth at bedtime.   Current Outpatient Medications (Cardiovascular):  .  amLODipine (NORVASC) 5 MG tablet, Take 5 mg by mouth daily.  Current Outpatient Medications (Respiratory):  .  montelukast (SINGULAIR) 10 MG tablet, Take 10 mg by mouth at bedtime.  Current Outpatient Medications (Analgesics):  .  HYDROcodone-acetaminophen (NORCO) 7.5-325 MG tablet, Take 1-2 tablets by mouth every 4 (four) hours as needed for moderate pain. .  rizatriptan (MAXALT) 5 MG tablet, Take 5 mg by mouth every 2 (two) hours as needed for migraine.  Current Outpatient Medications (Hematological):  .  enoxaparin (LOVENOX) 40 MG/0.4ML injection, Inject 0.4 mLs (40 mg total) into the skin daily for 14 days.  Current Outpatient Medications (Other):  .  Biotin w/ Vitamins C & E (HAIR/SKIN/NAILS PO), Take 2 tablets by mouth every evening. .  bisacodyl (DULCOLAX) 5 MG EC tablet, Take 5 mg by mouth every evening. .  docusate sodium (COLACE) 100 MG capsule, Take 100 mg by mouth every evening. .  Nutritional Supplements (JUICE PLUS FIBRE PO), Take 4 tablets by mouth 2 (two) times daily. Take 2 tablets of Fruit Blend + 2 tablets of   Vegetable Blend twice daily .  omeprazole (PRILOSEC OTC) 20 MG tablet, Take 20 mg by mouth daily as needed (acid reflux). .  ondansetron (ZOFRAN) 4 MG tablet, Take 1 tablet (4 mg total) by mouth every 6 (six) hours as needed for nausea. Marland Kitchen  OVER THE COUNTER MEDICATION, Take 3 drops by mouth at bedtime. cbd oil (3 dropperfuls) .  phentermine (ADIPEX-P) 37.5 MG tablet, Take 37.5 mg by mouth daily.  Marland Kitchen  tiZANidine (ZANAFLEX) 4 MG tablet, Take 1-2 tablets (4-8 mg total) by mouth every 6 (six) hours as needed for muscle spasms. .  Turmeric Curcumin 500 MG CAPS, Take 500 mg by mouth every evening.    Past medical history, social, surgical and family history all reviewed in electronic medical record.  No pertanent information unless stated regarding to the chief complaint.   Review of Systems:  No headache, visual changes, nausea, vomiting, diarrhea, constipation, dizziness, abdominal pain, skin rash, fevers, chills, night sweats, weight loss, swollen lymph nodes, chest pain, shortness of breath, mood changes.  Positive muscle aches, body aches, joint swelling  Objective  Blood pressure 130/80, pulse 76, height 5\' 5"  (1.651 m), weight 171 lb (77.6 kg), SpO2 96 %.   General: No apparent distress alert and oriented x3 mood and affect normal, dressed appropriately.  HEENT: Pupils equal, extraocular movements intact  Respiratory: Patient's speak in full sentences and does not appear short of breath  Cardiovascular: No lower extremity edema, non tender, no erythema  Skin: Warm dry intact with no signs of infection or rash on extremities or on axial skeleton.  Abdomen: Soft nontender  Neuro: Cranial nerves II through XII are intact, neurovascularly intact in all extremities with 2+ DTRs and 2+ pulses.  Lymph: No lymphadenopathy of posterior or anterior cervical chain or axillae bilaterally.  Gait antalgic.  MSK:  tender with near full range of motion and good stability and symmetric strength and tone of  shoulders, elbows, wrist, , knee and ankles bilaterally.  Back Exam:  Inspection: Loss of lordosis Motion: Flexion 45 deg, Extension 25 deg, Side Bending to 35 deg bilaterally,  Rotation to 25 deg bilaterally  SLR laying: Negative  XSLR laying: Negative  Palpable tenderness: Tender to palpation of the paraspinal muscular.  Severely in the L4-S1 area bilaterally FABER: Decreased range of motion on the left and right.. Sensory change: Gross sensation intact to all lumbar and sacral dermatomes.  Reflexes: 2+ at both patellar tendons, 2+ at achilles tendons, Babinski's downgoing.  Strength at foot  Plantar-flexion: 5/5 Dorsi-flexion: 5/5 Eversion: 5/5 Inversion: 5/5  Leg strength  Quad: 5/5 Hamstring: 5/5 Hip flexor: 5/5 Hip abductors: 4/5 but symmetric     Impression and Recommendations:     This case required medical decision making of moderate complexity. The above documentation has been reviewed and is accurate and complete Lyndal Pulley, DO       Note: This dictation was prepared with Dragon dictation along with smaller phrase technology. Any transcriptional errors that result from this process are unintentional.

## 2017-12-20 ENCOUNTER — Encounter: Payer: Self-pay | Admitting: Family Medicine

## 2017-12-20 ENCOUNTER — Ambulatory Visit (INDEPENDENT_AMBULATORY_CARE_PROVIDER_SITE_OTHER): Payer: BLUE CROSS/BLUE SHIELD | Admitting: Family Medicine

## 2017-12-20 ENCOUNTER — Other Ambulatory Visit (INDEPENDENT_AMBULATORY_CARE_PROVIDER_SITE_OTHER): Payer: BLUE CROSS/BLUE SHIELD

## 2017-12-20 VITALS — BP 130/80 | HR 76 | Ht 65.0 in | Wt 171.0 lb

## 2017-12-20 DIAGNOSIS — M2550 Pain in unspecified joint: Secondary | ICD-10-CM

## 2017-12-20 DIAGNOSIS — M5136 Other intervertebral disc degeneration, lumbar region: Secondary | ICD-10-CM

## 2017-12-20 DIAGNOSIS — Z96641 Presence of right artificial hip joint: Secondary | ICD-10-CM | POA: Diagnosis not present

## 2017-12-20 DIAGNOSIS — M1612 Unilateral primary osteoarthritis, left hip: Secondary | ICD-10-CM | POA: Diagnosis not present

## 2017-12-20 DIAGNOSIS — M51369 Other intervertebral disc degeneration, lumbar region without mention of lumbar back pain or lower extremity pain: Secondary | ICD-10-CM

## 2017-12-20 LAB — CBC WITH DIFFERENTIAL/PLATELET
Basophils Absolute: 0 10*3/uL (ref 0.0–0.1)
Basophils Relative: 0.6 % (ref 0.0–3.0)
EOS PCT: 3.6 % (ref 0.0–5.0)
Eosinophils Absolute: 0.2 10*3/uL (ref 0.0–0.7)
HCT: 38 % (ref 36.0–46.0)
HEMOGLOBIN: 12.9 g/dL (ref 12.0–15.0)
Lymphocytes Relative: 45.4 % (ref 12.0–46.0)
Lymphs Abs: 2.8 10*3/uL (ref 0.7–4.0)
MCHC: 33.9 g/dL (ref 30.0–36.0)
MCV: 86.8 fl (ref 78.0–100.0)
MONO ABS: 0.4 10*3/uL (ref 0.1–1.0)
Monocytes Relative: 6.5 % (ref 3.0–12.0)
Neutro Abs: 2.7 10*3/uL (ref 1.4–7.7)
Neutrophils Relative %: 43.9 % (ref 43.0–77.0)
Platelets: 307 10*3/uL (ref 150.0–400.0)
RBC: 4.38 Mil/uL (ref 3.87–5.11)
RDW: 13.6 % (ref 11.5–15.5)
WBC: 6.2 10*3/uL (ref 4.0–10.5)

## 2017-12-20 LAB — COMPREHENSIVE METABOLIC PANEL
ALBUMIN: 4.5 g/dL (ref 3.5–5.2)
ALT: 15 U/L (ref 0–35)
AST: 13 U/L (ref 0–37)
Alkaline Phosphatase: 58 U/L (ref 39–117)
BUN: 13 mg/dL (ref 6–23)
CHLORIDE: 102 meq/L (ref 96–112)
CO2: 26 mEq/L (ref 19–32)
CREATININE: 0.72 mg/dL (ref 0.40–1.20)
Calcium: 9.8 mg/dL (ref 8.4–10.5)
GFR: 91.14 mL/min (ref >60.00)
GLUCOSE: 97 mg/dL (ref 70–99)
POTASSIUM: 3.6 meq/L (ref 3.5–5.1)
SODIUM: 136 meq/L (ref 135–145)
TOTAL PROTEIN: 8.2 g/dL (ref 6.0–8.3)
Total Bilirubin: 0.3 mg/dL (ref 0.2–1.2)

## 2017-12-20 LAB — SEDIMENTATION RATE: SED RATE: 51 mm/h — AB (ref 0–20)

## 2017-12-20 LAB — IBC PANEL
IRON: 86 ug/dL (ref 42–145)
Saturation Ratios: 20.1 % (ref 20.0–50.0)
TRANSFERRIN: 306 mg/dL (ref 212.0–360.0)

## 2017-12-20 LAB — T3, FREE: T3, Free: 3.7 pg/mL (ref 2.3–4.2)

## 2017-12-20 LAB — C-REACTIVE PROTEIN: CRP: 0.6 mg/dL (ref 0.5–20.0)

## 2017-12-20 LAB — T4, FREE: Free T4: 0.76 ng/dL (ref 0.60–1.60)

## 2017-12-20 LAB — TSH: TSH: 2.84 u[IU]/mL (ref 0.35–4.50)

## 2017-12-20 LAB — URIC ACID: URIC ACID, SERUM: 5.8 mg/dL (ref 2.4–7.0)

## 2017-12-20 NOTE — Assessment & Plan Note (Signed)
Patient does have arthritis of the left hip.  Patient's arthritis is advanced.  I do believe that further evaluation with laboratory work-up would be beneficial.  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

## 2017-12-20 NOTE — Patient Instructions (Signed)
I am sorry for what you are going through.  Ice 20 minutes 2 times daily. Usually after activity and before bed. We will get labs today and see what comes out.  Tart cherry extract any dose at night I will write you when we get labs and see what other changes we should make.  I think fixing the other hip might be a good idea.

## 2017-12-20 NOTE — Assessment & Plan Note (Signed)
Seems stable.  Had impingement, referred surfacing and then replacement in August.  Seems to be doing relatively well.  May need more physical therapy in the long run

## 2017-12-20 NOTE — Assessment & Plan Note (Signed)
Severe.  Significantly further along.  No true traumatic injuries.  Laboratory work-up is pending.  We discussed potentially having patient's left hip replaced first before further evaluation of the back and then injections.  Patient is in agreement with the plan.  We will follow-up again in 4 to 8 weeks

## 2017-12-21 LAB — CANCER ANTIGEN 19-9: CA 19-9: 4 U/mL (ref <34)

## 2017-12-23 LAB — PTH, INTACT AND CALCIUM
Calcium: 9.9 mg/dL (ref 8.6–10.2)
PTH: 42 pg/mL (ref 14–64)

## 2017-12-23 LAB — VITAMIN D 1,25 DIHYDROXY
Vitamin D 1, 25 (OH)2 Total: 71 pg/mL (ref 18–72)
Vitamin D3 1, 25 (OH)2: 71 pg/mL

## 2017-12-23 LAB — RHEUMATOID FACTOR

## 2017-12-23 LAB — CA 125: CA 125: 19 U/mL (ref <35)

## 2017-12-23 LAB — CYCLIC CITRUL PEPTIDE ANTIBODY, IGG: Cyclic Citrullin Peptide Ab: <16 UNITS

## 2017-12-23 LAB — ANA: ANA: NEGATIVE

## 2017-12-23 LAB — ANGIOTENSIN CONVERTING ENZYME: ANGIOTENSIN-CONVERTING ENZYME: 31 U/L (ref 9–67)

## 2017-12-23 LAB — CALCIUM, IONIZED: Calcium, Ion: 5.25 mg/dL (ref 4.8–5.6)

## 2017-12-25 ENCOUNTER — Telehealth: Payer: Self-pay

## 2017-12-25 NOTE — Telephone Encounter (Signed)
-----   Message from Lyndal Pulley, DO sent at 12/24/2017  1:18 PM EDT ----- Can we call patient, tell her all labs except sediminatation rate is normal. Does show inflammation but this could be from the surgery still. All good news at moment.

## 2017-12-25 NOTE — Telephone Encounter (Signed)
Talked to patient about lab results.  

## 2017-12-29 ENCOUNTER — Telehealth: Payer: Self-pay | Admitting: *Deleted

## 2017-12-29 DIAGNOSIS — M1612 Unilateral primary osteoarthritis, left hip: Secondary | ICD-10-CM

## 2017-12-29 NOTE — Telephone Encounter (Signed)
Pt called asking what the next steps are since all of her labs are normal.

## 2018-01-01 NOTE — Telephone Encounter (Signed)
Consider MRI of pelvis with and without contrast just to make sure nothing else is contributing if she would like  If all normal then go forward with hip replacement

## 2018-01-01 NOTE — Telephone Encounter (Signed)
lmovm for pt to return call.  

## 2018-01-04 NOTE — Telephone Encounter (Signed)
Spoke to pt, she has decided to go ahead with the referral to [facility].  Referral entered & faxed.

## 2018-01-08 ENCOUNTER — Encounter (INDEPENDENT_AMBULATORY_CARE_PROVIDER_SITE_OTHER): Payer: Self-pay | Admitting: Orthopaedic Surgery

## 2018-01-08 ENCOUNTER — Ambulatory Visit (INDEPENDENT_AMBULATORY_CARE_PROVIDER_SITE_OTHER): Payer: BLUE CROSS/BLUE SHIELD | Admitting: Orthopaedic Surgery

## 2018-01-08 ENCOUNTER — Ambulatory Visit (INDEPENDENT_AMBULATORY_CARE_PROVIDER_SITE_OTHER): Payer: Self-pay

## 2018-01-08 DIAGNOSIS — M25552 Pain in left hip: Secondary | ICD-10-CM

## 2018-01-08 DIAGNOSIS — M25551 Pain in right hip: Secondary | ICD-10-CM | POA: Diagnosis not present

## 2018-01-08 NOTE — Progress Notes (Signed)
Office Visit Note   Patient: Paula Moses           Date of Birth: [birth year]           MRN: [id number] Visit Date: 01/08/2018              Requested by: Marinda Elk, MD [facility] [place], [place] [address] PCP: Marinda Elk, MD   Assessment & Plan: Visit Diagnoses:  1. Pain in left hip     Plan: She understands fully that I would be more comfortable obtaining a dedicated MRI of her left hip to truly assess what her cartilage looks like in that left hip.  I speculate that she is having some worsening disease in her left hip will want to make absolutely sure that that is the case given the fact that she does have some lumbar spine issues that could be contributing to groin pain.  She understands this as well and she understands my rationale behind recommending an MRI of her left hip to further assess the cartilage.  All question concerns were answered and addressed.  We will see her back after this MRI is obtained.  Follow-Up Instructions: Return in about 2 years (around 01/09/2020).   Orders:  Orders Placed This Encounter  Procedures  . XR HIPS BILAT W OR W/O PELVIS 2V   No orders of the defined types were placed in this encounter.     Procedures: No procedures performed   Clinical Data: No additional findings.   Subjective: Chief Complaint  Patient presents with  . Left Hip - Pain  Patient is a very pleasant 52 year old female who comes in today for an opinion as it relates to her left hip.  She actually has a right total hip arthroplasty was done in just August of this year.  She is not 3 months out from that surgery yet.  It is starting to feel better but occasionally she gets a catch in the hip on the right side and I told her this can be normal in the immediate postoperative period.  She is been having some left hip and groin pain.  She wants to consider potentially hip replacement on the left side.  Her pain is daily  and is detrimental effect directives daily living, quality of life and her mobility.  She does let me know that her hip rapidly got worse after a hip arthroscopy there was done in her right hip at the early part of this year.  After the hip arthroscopy things worsen significantly to the point that her pain was bad enough to proceed with hip replacement surgery on the right side.  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  HPI  Review of Systems She currently denies any headache, chest pain, shortness of breath, fever, chills, nausea, vomiting.  Objective: Vital Signs: There were no vitals taken for this visit.  Physical Exam She is alert and oriented x3 and in no acute distress Ortho Exam Examination of both hips show the move fluidly.  She has a little bit of pain on the right operative side but her left side has more pain with internal and external rotation and it seems to be in the groin. Specialty Comments:  No specialty comments available.  Imaging: Xr Hips Bilat W Or W/o Pelvis 2v  Result Date: 01/08/2018 An  AP pelvis and lateral of both hips are reviewed.  The patient has a right total hip arthroplasty with no complicating features.  The left hip shows slight joint space narrowing in the hip joint.  There is an osteophyte off of the tip of the greater trochanter.  I did independently review the MRI of her right hip from earlier this year.  It did show some arthritic changes in the right hip.  The left hip was only seen in a single plane and you cannot tell if there is arthritis in the left hip based on the single views.  PMFS History: Patient Active Problem List   Diagnosis Date Noted  . Arthritis of left hip 12/20/2017  . Degenerative disc disease, lumbar 12/20/2017  . Status post total hip replacement, right 10/26/2017  . Femoroacetabular impingement 05/05/2017  . Surgical  menopause 01/26/2017  . Status post vaginal hysterectomy BSO 01/26/2017  . Hypertension   . High cholesterol   . Fibroids   . Trigeminal neuralgia   . Left-sided face pain   . Neck pain on left side 07/16/2013  . Left facial pain 07/16/2013  . Paresthesia 07/16/2013   Past Medical History:  Diagnosis Date  . Abnormal Pap smear of cervix   . Abnormal vaginal bleeding   . Anxiety   . Arthritis    neck and body  . Complication of anesthesia    IT TAKES ALOT OF ANESTHESIA TO KEEP PT SEDATED  . Cyst of vulva   . Dyspareunia   . External hemorrhoid   . Fibroids   . GERD (gastroesophageal reflux disease)   . Headache 2019   migraines, becoming more frequent  . High cholesterol   . History of UTI   . Hypertension   . Left-sided face pain   . Neck pain   . Paresthesia   . Rectocele   . Surgical menopause   . Trigeminal neuralgia   . Uterus prolapse     Family History  Problem Relation Age of Onset  . Brain cancer Mother   . Dementia Father   . Breast cancer Neg Hx     Past Surgical History:  Procedure Laterality Date  . AUGMENTATION MAMMAPLASTY Bilateral 2006  . BREAST ENHANCEMENT SURGERY    . CHOLECYSTECTOMY    . HIP ARTHROSCOPY Right 05/05/2017   Procedure: ARTHROSCOPY HIP;  Surgeon: Leim Fabry, MD;  Location: [facility] ORS;  Service: Orthopedics;  Laterality: Right;  . LAPAROSCOPY     X2  . TOTAL HIP ARTHROPLASTY Right 10/26/2017   Procedure: TOTAL HIP ARTHROPLASTY ANTERIOR APPROACH;  Surgeon: [name], MD;  Location: [facility] ORS;  Service: Orthopedics;  Laterality: Right;  . tummy tuck     . VAGINAL HYSTERECTOMY     lahv bso   Social History   Occupational History  . Occupation: realtor  Tobacco Use  . Smoking status: Never Smoker  . Smokeless tobacco: Never Used  Substance and Sexual Activity  . Alcohol use: Yes    Comment: 2 glasses  . Drug use: No  . Sexual activity: Yes    Birth control/protection: Surgical

## 2018-01-09 ENCOUNTER — Other Ambulatory Visit (INDEPENDENT_AMBULATORY_CARE_PROVIDER_SITE_OTHER): Payer: Self-pay

## 2018-01-09 DIAGNOSIS — M25552 Pain in left hip: Secondary | ICD-10-CM

## 2018-01-16 ENCOUNTER — Other Ambulatory Visit: Payer: BLUE CROSS/BLUE SHIELD

## 2018-01-20 ENCOUNTER — Ambulatory Visit (HOSPITAL_COMMUNITY)
Admission: RE | Admit: 2018-01-20 | Discharge: 2018-01-20 | Disposition: A | Discharge status: Home / Self Care | Payer: Self-pay | Source: Ambulatory Visit | Attending: Orthopaedic Surgery | Admitting: Orthopaedic Surgery

## 2018-01-20 DIAGNOSIS — M1612 Unilateral primary osteoarthritis, left hip: Secondary | ICD-10-CM | POA: Insufficient documentation

## 2018-01-20 DIAGNOSIS — M25552 Pain in left hip: Secondary | ICD-10-CM | POA: Insufficient documentation

## 2018-01-22 ENCOUNTER — Ambulatory Visit (INDEPENDENT_AMBULATORY_CARE_PROVIDER_SITE_OTHER): Payer: Self-pay | Admitting: Orthopaedic Surgery

## 2018-01-22 ENCOUNTER — Encounter (INDEPENDENT_AMBULATORY_CARE_PROVIDER_SITE_OTHER): Payer: Self-pay | Admitting: Orthopaedic Surgery

## 2018-01-22 DIAGNOSIS — M1612 Unilateral primary osteoarthritis, left hip: Secondary | ICD-10-CM

## 2018-01-22 NOTE — Progress Notes (Signed)
The patient is here for follow-up after having an MRI of her left hip.  She has a previous right hip replacement was done just over 3 months ago in [facility].  She is had a lot of pain in her left hip with internal and external rotation but plain films did not show any significant disease who was sent her for an MRI of that hip.  She is here for review of this today.  On exam she still has a lot of pain with internal extra rotation of her left hip and is significant.  She is stiffness in that hip as well.  The MRI does show moderate to advanced arthritis in her left hip that she cannot see on plain films.  Is more advanced for her age.  There is edema in the femoral head and the acetabulum but no evidence of AVN.  There is areas of significant cartilage loss as well.  Given the findings that she has an MRI as well as her clinical exam she does wish to proceed with a left total hip arthroplasty.  We had a long and thorough discussion about the surgery.  We talked about the risk and benefits involved as well as her operative and postoperative course.  Again she is already had a hip replacement done earlier this year on her opposite side.  She does have degenerative disease in her lumbar spine and I hope this will help her from just a posture standpoint and can help her back as well.  All question concerns were answered and addressed.  We will work on getting this scheduled.  She is already had injections and all other conservative treatment for her left hip and this is failed.  Especially given the MRI findings of moderate to advanced arthritis in her hip combined with her clinical exam I do feel this is medically necessary at this point.  We will work on getting surgery scheduled.

## 2018-01-23 ENCOUNTER — Inpatient Hospital Stay: Admission: RE | Admit: 2018-01-23 | Payer: BLUE CROSS/BLUE SHIELD | Source: Ambulatory Visit

## 2018-01-30 ENCOUNTER — Inpatient Hospital Stay
Admission: RE | Admit: 2018-01-30 | Payer: BLUE CROSS/BLUE SHIELD | Source: Ambulatory Visit | Admitting: Orthopedic Surgery

## 2018-01-30 ENCOUNTER — Encounter: Admission: RE | Payer: Self-pay | Source: Ambulatory Visit

## 2018-01-30 ENCOUNTER — Other Ambulatory Visit (INDEPENDENT_AMBULATORY_CARE_PROVIDER_SITE_OTHER): Payer: Self-pay | Admitting: Physician Assistant

## 2018-01-30 SURGERY — ARTHROPLASTY, HIP, TOTAL, ANTERIOR APPROACH
Anesthesia: Choice | Laterality: Left

## 2018-01-31 NOTE — Patient Instructions (Signed)
Paula Moses  01/31/2018   Your procedure is scheduled on: 02-09-18   Report to [facility] Main  Entrance    Report to admitting at 5:30AM    Call this number if you have problems the morning of surgery [phone]     Remember: Do not eat food or drink liquids :After Midnight. BRUSH YOUR TEETH MORNING OF SURGERY AND RINSE YOUR MOUTH OUT, NO CHEWING GUM CANDY OR MINTS.     Take these medicines the morning of surgery with A SIP OF WATER: GABAPENTIN, OMEPRAZOLE IF NEEDED                                You may not have any metal on your body including hair pins and              piercings  Do not wear jewelry, make-up, lotions, powders or perfumes, deodorant             Do not wear nail polish.  Do not shave  48 hours prior to surgery.                Do not bring valuables to the hospital. [place].  Contacts, dentures or bridgework may not be worn into surgery.  Leave suitcase in the car. After surgery it may be brought to your room.                  Please read over the following fact sheets you were given: _____________________________________________________________________             [facility] - Preparing for Surgery Before surgery, you can play an important role.  Because skin is not sterile, your skin needs to be as free of germs as possible.  You can reduce the number of germs on your skin by washing with CHG (chlorahexidine gluconate) soap before surgery.  CHG is an antiseptic cleaner which kills germs and bonds with the skin to continue killing germs even after washing. Please DO NOT use if you have an allergy to CHG or antibacterial soaps.  If your skin becomes reddened/irritated stop using the CHG and inform your nurse when you arrive at Short Stay. Do not shave (including legs and underarms) for at least 48 hours prior to the first CHG shower.  You may shave your face/neck. Please follow  these instructions carefully:  1.  Shower with CHG Soap the night before surgery and the  morning of Surgery.  2.  If you choose to wash your hair, wash your hair first as usual with your  normal  shampoo.  3.  After you shampoo, rinse your hair and body thoroughly to remove the  shampoo.                           4.  Use CHG as you would any other liquid soap.  You can apply chg directly  to the skin and wash                       Gently with a scrungie or clean washcloth.  5.  Apply the CHG Soap to your body ONLY FROM THE NECK  DOWN.   Do not use on face/ open                           Wound or open sores. Avoid contact with eyes, ears mouth and genitals (private parts).                       Wash face,  Genitals (private parts) with your normal soap.             6.  Wash thoroughly, paying special attention to the area where your surgery  will be performed.  7.  Thoroughly rinse your body with warm water from the neck down.  8.  DO NOT shower/wash with your normal soap after using and rinsing off  the CHG Soap.                9.  Pat yourself dry with a clean towel.            10.  Wear clean pajamas.            11.  Place clean sheets on your bed the night of your first shower and do not  sleep with pets. Day of Surgery : Do not apply any lotions/deodorants the morning of surgery.  Please wear clean clothes to the hospital/surgery center.  FAILURE TO FOLLOW THESE INSTRUCTIONS MAY RESULT IN THE CANCELLATION OF YOUR SURGERY PATIENT SIGNATURE_________________________________  NURSE SIGNATURE__________________________________  ________________________________________________________________________   Paula Moses  An incentive spirometer is a tool that can help keep your lungs clear and active. This tool measures how well you are filling your lungs with each breath. Taking long deep breaths may help reverse or decrease the chance of developing breathing (pulmonary) problems  (especially infection) following:  A long period of time when you are unable to move or be active. BEFORE THE PROCEDURE   If the spirometer includes an indicator to show your best effort, your nurse or respiratory therapist will set it to a desired goal.  If possible, sit up straight or lean slightly forward. Try not to slouch.  Hold the incentive spirometer in an upright position. INSTRUCTIONS FOR USE  1. Sit on the edge of your bed if possible, or sit up as far as you can in bed or on a chair. 2. Hold the incentive spirometer in an upright position. 3. Breathe out normally. 4. Place the mouthpiece in your mouth and seal your lips tightly around it. 5. Breathe in slowly and as deeply as possible, raising the piston or the ball toward the top of the column. 6. Hold your breath for 3-5 seconds or for as long as possible. Allow the piston or ball to fall to the bottom of the column. 7. Remove the mouthpiece from your mouth and breathe out normally. 8. Rest for a few seconds and repeat Steps 1 through 7 at least 10 times every 1-2 hours when you are awake. Take your time and take a few normal breaths between deep breaths. 9. The spirometer may include an indicator to show your best effort. Use the indicator as a goal to work toward during each repetition. 10. After each set of 10 deep breaths, practice coughing to be sure your lungs are clear. If you have an incision (the cut made at the time of surgery), support your incision when coughing by placing a pillow or rolled up towels firmly against it. Once you are able to  get out of bed, walk around indoors and cough well. You may stop using the incentive spirometer when instructed by your caregiver.  RISKS AND COMPLICATIONS  Take your time so you do not get dizzy or light-headed.  If you are in pain, you may need to take or ask for pain medication before doing incentive spirometry. It is harder to take a deep breath if you are having  pain. AFTER USE  Rest and breathe slowly and easily.  It can be helpful to keep track of a log of your progress. Your caregiver can provide you with a simple table to help with this. If you are using the spirometer at home, follow these instructions: [place] IF:   You are having difficultly using the spirometer.  You have trouble using the spirometer as often as instructed.  Your pain medication is not giving enough relief while using the spirometer.  You develop fever of 100.5 F (38.1 C) or higher. SEEK IMMEDIATE MEDICAL CARE IF:   You cough up bloody sputum that had not been present before.  You develop fever of 102 F (38.9 C) or greater.  You develop worsening pain at or near the incision site. MAKE SURE YOU:   Understand these instructions.  Will watch your condition.  Will get help right away if you are not doing well or get worse. Document Released: 07/11/2006 Document Revised: 05/23/2011 Document Reviewed: 09/11/2006 [facility] Patient Information 2014 [place], [place].   ________________________________________________________________________

## 2018-01-31 NOTE — Progress Notes (Signed)
EKG 10-25-17 Epic

## 2018-02-01 ENCOUNTER — Inpatient Hospital Stay (HOSPITAL_COMMUNITY)
Admission: RE | Admit: 2018-02-01 | Discharge: 2018-02-01 | Disposition: A | Discharge status: Home / Self Care | Payer: Self-pay | Source: Ambulatory Visit

## 2018-02-09 ENCOUNTER — Inpatient Hospital Stay (HOSPITAL_COMMUNITY): Admission: RE | Admit: 2018-02-09 | Payer: Self-pay | Source: Ambulatory Visit | Admitting: Orthopaedic Surgery

## 2018-02-09 ENCOUNTER — Encounter (HOSPITAL_COMMUNITY): Admission: RE | Payer: Self-pay | Source: Ambulatory Visit

## 2018-02-09 SURGERY — ARTHROPLASTY, HIP, TOTAL, ANTERIOR APPROACH
Anesthesia: Choice | Laterality: Left

## 2018-02-22 ENCOUNTER — Inpatient Hospital Stay (INDEPENDENT_AMBULATORY_CARE_PROVIDER_SITE_OTHER): Payer: Self-pay | Admitting: Orthopaedic Surgery

## 2018-03-22 ENCOUNTER — Telehealth (INDEPENDENT_AMBULATORY_CARE_PROVIDER_SITE_OTHER): Payer: Self-pay

## 2018-03-22 ENCOUNTER — Inpatient Hospital Stay: Admission: RE | Admit: 2018-03-22 | Payer: Self-pay | Source: Ambulatory Visit

## 2018-03-22 ENCOUNTER — Other Ambulatory Visit (INDEPENDENT_AMBULATORY_CARE_PROVIDER_SITE_OTHER): Payer: Self-pay

## 2018-03-22 DIAGNOSIS — M5136 Other intervertebral disc degeneration, lumbar region: Secondary | ICD-10-CM

## 2018-03-22 NOTE — Telephone Encounter (Signed)
Sent to her [facility]

## 2018-03-22 NOTE — Telephone Encounter (Signed)
Patient is scheduled for THA on 03/30/18.  Would it be okay if she had a lumbar ESI between now and then?  She already has a Physiatrist that does these for her.  [phone]

## 2018-03-22 NOTE — Telephone Encounter (Signed)
Please advise 

## 2018-03-22 NOTE — Telephone Encounter (Signed)
That will be fine. 

## 2018-03-26 ENCOUNTER — Other Ambulatory Visit (INDEPENDENT_AMBULATORY_CARE_PROVIDER_SITE_OTHER): Payer: Self-pay | Admitting: Physician Assistant

## 2018-03-26 NOTE — Patient Instructions (Addendum)
Paula Moses  03/26/2018   Your procedure is scheduled on: 03-30-18    Report to [facility] Main  Entrance    Report to Admitting at 11:00 AM    Call this number if you have problems the morning of surgery [phone]    Remember: Do not eat food or drink liquids :After Midnight.  You may have a Clear Liquid Diet from Midnight until 7:30 AM. After 7:30 AM, nothing until after surgery.    CLEAR LIQUID DIET   Foods Allowed                                                                     Foods Excluded  Coffee and tea, regular and decaf                             liquids that you cannot  Plain Jell-O in any flavor                                             see through such as: Fruit ices (not with fruit pulp)                                     milk, soups, orange juice  Iced Popsicles                                    All solid food Carbonated beverages, regular and diet                                    Cranberry, grape and apple juices Sports drinks like Gatorade Lightly seasoned clear broth or consume(fat free) Sugar, honey syrup  Sample Menu Breakfast                                Lunch                                     Supper Cranberry juice                    Beef broth                            Chicken broth Jell-O                                     Grape juice  Apple juice Coffee or tea                        Jell-O                                      Popsicle                                                Coffee or tea                        Coffee or tea  _____________________________________________________________________     Take these medicines the morning of surgery with A SIP OF WATER: Omeprazole if needed.  BRUSH YOUR TEETH MORNING OF SURGERY AND RINSE YOUR MOUTH OUT, NO CHEWING GUM CANDY OR MINTS.                                 You may not have any metal on your body including hair pins  and              piercings  Do not wear jewelry, make-up, lotions, powders or perfumes, deodorant             Do not wear nail polish.  Do not shave  48 hours prior to surgery.                Do not bring valuables to the hospital. [place].  Contacts, dentures or bridgework may not be worn into surgery.  Leave suitcase in the car. After surgery it may be brought to your room.     Patients discharged the day of surgery will not be allowed to drive home. IF YOU ARE HAVING SURGERY AND GOING HOME THE SAME DAY, YOU MUST HAVE AN ADULT TO DRIVE YOU HOME AND BE WITH YOU FOR 24 HOURS. YOU MAY GO HOME BY TAXI OR UBER OR ORTHERWISE, BUT AN ADULT MUST ACCOMPANY YOU HOME AND STAY WITH YOU FOR 24 HOURS.   Special Instructions: N/A              Please read over the following fact sheets you were given: _____________________________________________________________________             [facility] - Preparing for Surgery Before surgery, you can play an important role.  Because skin is not sterile, your skin needs to be as free of germs as possible.  You can reduce the number of germs on your skin by washing with CHG (chlorahexidine gluconate) soap before surgery.  CHG is an antiseptic cleaner which kills germs and bonds with the skin to continue killing germs even after washing. Please DO NOT use if you have an allergy to CHG or antibacterial soaps.  If your skin becomes reddened/irritated stop using the CHG and inform your nurse when you arrive at Short Stay. Do not shave (including legs and underarms) for at least 48 hours prior to the first CHG shower.  You may shave your face/neck. Please follow these instructions carefully:  1.  Shower with CHG Soap  the night before surgery and the  morning of Surgery.  2.  If you choose to wash your hair, wash your hair first as usual with your  normal  shampoo.  3.  After you shampoo, rinse your hair and body  thoroughly to remove the  shampoo.                           4.  Use CHG as you would any other liquid soap.  You can apply chg directly  to the skin and wash                       Gently with a scrungie or clean washcloth.  5.  Apply the CHG Soap to your body ONLY FROM THE NECK DOWN.   Do not use on face/ open                           Wound or open sores. Avoid contact with eyes, ears mouth and genitals (private parts).                       Wash face,  Genitals (private parts) with your normal soap.             6.  Wash thoroughly, paying special attention to the area where your surgery  will be performed.  7.  Thoroughly rinse your body with warm water from the neck down.  8.  DO NOT shower/wash with your normal soap after using and rinsing off  the CHG Soap.                9.  Pat yourself dry with a clean towel.            10.  Wear clean pajamas.            11.  Place clean sheets on your bed the night of your first shower and do not  sleep with pets. Day of Surgery : Do not apply any lotions/deodorants the morning of surgery.  Please wear clean clothes to the hospital/surgery center.  FAILURE TO FOLLOW THESE INSTRUCTIONS MAY RESULT IN THE CANCELLATION OF YOUR SURGERY PATIENT SIGNATURE_________________________________  NURSE SIGNATURE__________________________________  ________________________________________________________________________   [name]  An incentive spirometer is a tool that can help keep your lungs clear and active. This tool measures how well you are filling your lungs with each breath. Taking long deep breaths may help reverse or decrease the chance of developing breathing (pulmonary) problems (especially infection) following:  A long period of time when you are unable to move or be active. BEFORE THE PROCEDURE   If the spirometer includes an indicator to show your best effort, your nurse or respiratory therapist will set it to a desired goal.  If  possible, sit up straight or lean slightly forward. Try not to slouch.  Hold the incentive spirometer in an upright position. INSTRUCTIONS FOR USE  1. Sit on the edge of your bed if possible, or sit up as far as you can in bed or on a chair. 2. Hold the incentive spirometer in an upright position. 3. Breathe out normally. 4. Place the mouthpiece in your mouth and seal your lips tightly around it. 5. Breathe in slowly and as deeply as possible, raising the piston or the ball toward the top of the column. 6. Hold your breath for  3-5 seconds or for as long as possible. Allow the piston or ball to fall to the bottom of the column. 7. Remove the mouthpiece from your mouth and breathe out normally. 8. Rest for a few seconds and repeat Steps 1 through 7 at least 10 times every 1-2 hours when you are awake. Take your time and take a few normal breaths between deep breaths. 9. The spirometer may include an indicator to show your best effort. Use the indicator as a goal to work toward during each repetition. 10. After each set of 10 deep breaths, practice coughing to be sure your lungs are clear. If you have an incision (the cut made at the time of surgery), support your incision when coughing by placing a pillow or rolled up towels firmly against it. Once you are able to get out of bed, walk around indoors and cough well. You may stop using the incentive spirometer when instructed by your caregiver.  RISKS AND COMPLICATIONS  Take your time so you do not get dizzy or light-headed.  If you are in pain, you may need to take or ask for pain medication before doing incentive spirometry. It is harder to take a deep breath if you are having pain. AFTER USE  Rest and breathe slowly and easily.  It can be helpful to keep track of a log of your progress. Your caregiver can provide you with a simple table to help with this. If you are using the spirometer at home, follow these instructions: [place]  IF:   You are having difficultly using the spirometer.  You have trouble using the spirometer as often as instructed.  Your pain medication is not giving enough relief while using the spirometer.  You develop fever of 100.5 F (38.1 C) or higher. SEEK IMMEDIATE MEDICAL CARE IF:   You cough up bloody sputum that had not been present before.  You develop fever of 102 F (38.9 C) or greater.  You develop worsening pain at or near the incision site. MAKE SURE YOU:   Understand these instructions.  Will watch your condition.  Will get help right away if you are not doing well or get worse. Document Released: 07/11/2006 Document Revised: 05/23/2011 Document Reviewed: 09/11/2006 [facility] Patient Information 2014 [place], [place].   ________________________________________________________________________

## 2018-03-26 NOTE — Progress Notes (Signed)
10-25-17 (Epic) EKG

## 2018-03-28 ENCOUNTER — Encounter (HOSPITAL_COMMUNITY): Payer: Self-pay

## 2018-03-28 ENCOUNTER — Encounter (HOSPITAL_COMMUNITY)
Admission: RE | Admit: 2018-03-28 | Discharge: 2018-03-28 | Disposition: A | Discharge status: Home / Self Care | Payer: PRIVATE HEALTH INSURANCE | Source: Ambulatory Visit | Attending: Orthopaedic Surgery | Admitting: Orthopaedic Surgery

## 2018-03-28 ENCOUNTER — Other Ambulatory Visit: Payer: Self-pay

## 2018-03-28 DIAGNOSIS — Z01812 Encounter for preprocedural laboratory examination: Secondary | ICD-10-CM | POA: Insufficient documentation

## 2018-03-28 LAB — SURGICAL PCR SCREEN
MRSA, PCR: NEGATIVE
Staphylococcus aureus: NEGATIVE

## 2018-03-28 LAB — CBC
HCT: 36.9 % (ref 36.0–46.0)
HEMOGLOBIN: 12.4 g/dL (ref 12.0–15.0)
MCH: 30.4 pg (ref 26.0–34.0)
MCHC: 33.6 g/dL (ref 30.0–36.0)
MCV: 90.4 fL (ref 80.0–100.0)
Platelets: 253 10*3/uL (ref 150–400)
RBC: 4.08 MIL/uL (ref 3.87–5.11)
RDW: 12.9 % (ref 11.5–15.5)
WBC: 6.8 10*3/uL (ref 4.0–10.5)
nRBC: 0 % (ref 0.0–0.2)

## 2018-03-28 LAB — BASIC METABOLIC PANEL
ANION GAP: 11 (ref 5–15)
BUN: 15 mg/dL (ref 6–20)
CO2: 23 mmol/L (ref 22–32)
Calcium: 9.1 mg/dL (ref 8.9–10.3)
Chloride: 103 mmol/L (ref 98–111)
Creatinine, Ser: 0.8 mg/dL (ref 0.44–1.00)
GFR calc Af Amer: >60 mL/min (ref >60)
GFR calc non Af Amer: >60 mL/min (ref >60)
Glucose, Bld: 96 mg/dL (ref 70–99)
Potassium: 4.3 mmol/L (ref 3.5–5.1)
Sodium: 137 mmol/L (ref 135–145)

## 2018-03-29 ENCOUNTER — Inpatient Hospital Stay: Admit: 2018-03-29 | Payer: PRIVATE HEALTH INSURANCE | Admitting: Orthopedic Surgery

## 2018-03-29 ENCOUNTER — Other Ambulatory Visit (INDEPENDENT_AMBULATORY_CARE_PROVIDER_SITE_OTHER): Payer: Self-pay | Admitting: Orthopaedic Surgery

## 2018-03-29 SURGERY — ARTHROPLASTY, HIP, TOTAL, ANTERIOR APPROACH
Anesthesia: Choice | Laterality: Left

## 2018-03-30 ENCOUNTER — Inpatient Hospital Stay (HOSPITAL_COMMUNITY): Payer: PRIVATE HEALTH INSURANCE | Admitting: Certified Registered"

## 2018-03-30 ENCOUNTER — Inpatient Hospital Stay (HOSPITAL_COMMUNITY)
Admission: RE | Admit: 2018-03-30 | Discharge: 2018-03-31 | DRG: 470 | Disposition: A | Discharge status: Home Health | Payer: PRIVATE HEALTH INSURANCE | Attending: Orthopaedic Surgery | Admitting: Orthopaedic Surgery

## 2018-03-30 ENCOUNTER — Encounter (HOSPITAL_COMMUNITY): Payer: Self-pay | Admitting: *Deleted

## 2018-03-30 ENCOUNTER — Encounter (HOSPITAL_COMMUNITY)
Admission: RE | Disposition: A | Discharge status: Home Health | Payer: Self-pay | Source: Home / Self Care | Attending: Orthopaedic Surgery

## 2018-03-30 ENCOUNTER — Inpatient Hospital Stay (HOSPITAL_COMMUNITY): Payer: PRIVATE HEALTH INSURANCE

## 2018-03-30 ENCOUNTER — Other Ambulatory Visit: Payer: Self-pay

## 2018-03-30 DIAGNOSIS — E7800 Pure hypercholesterolemia, unspecified: Secondary | ICD-10-CM | POA: Diagnosis present

## 2018-03-30 DIAGNOSIS — M25552 Pain in left hip: Secondary | ICD-10-CM | POA: Diagnosis present

## 2018-03-30 DIAGNOSIS — Z91048 Other nonmedicinal substance allergy status: Secondary | ICD-10-CM

## 2018-03-30 DIAGNOSIS — Z96643 Presence of artificial hip joint, bilateral: Secondary | ICD-10-CM | POA: Diagnosis present

## 2018-03-30 DIAGNOSIS — M1612 Unilateral primary osteoarthritis, left hip: Secondary | ICD-10-CM | POA: Diagnosis present

## 2018-03-30 DIAGNOSIS — Z886 Allergy status to analgesic agent status: Secondary | ICD-10-CM | POA: Diagnosis not present

## 2018-03-30 DIAGNOSIS — I10 Essential (primary) hypertension: Secondary | ICD-10-CM | POA: Diagnosis present

## 2018-03-30 DIAGNOSIS — Z885 Allergy status to narcotic agent status: Secondary | ICD-10-CM | POA: Diagnosis not present

## 2018-03-30 DIAGNOSIS — K219 Gastro-esophageal reflux disease without esophagitis: Secondary | ICD-10-CM | POA: Diagnosis present

## 2018-03-30 DIAGNOSIS — F419 Anxiety disorder, unspecified: Secondary | ICD-10-CM | POA: Diagnosis present

## 2018-03-30 DIAGNOSIS — Z96642 Presence of left artificial hip joint: Secondary | ICD-10-CM

## 2018-03-30 DIAGNOSIS — Z419 Encounter for procedure for purposes other than remedying health state, unspecified: Secondary | ICD-10-CM

## 2018-03-30 HISTORY — PX: TOTAL HIP ARTHROPLASTY: SHX124

## 2018-03-30 SURGERY — ARTHROPLASTY, HIP, TOTAL, ANTERIOR APPROACH
Anesthesia: Spinal | Site: Hip | Laterality: Left

## 2018-03-30 MED ORDER — MIDAZOLAM HCL 2 MG/2ML IJ SOLN
INTRAMUSCULAR | Status: DC | PRN
  Administered 2018-03-30: 2 mg via INTRAVENOUS

## 2018-03-30 MED ORDER — BUPIVACAINE IN DEXTROSE 0.75-8.25 % IT SOLN
INTRATHECAL | Status: DC | PRN
  Administered 2018-03-30: 1.6 mL via INTRATHECAL

## 2018-03-30 MED ORDER — FENTANYL CITRATE (PF) 100 MCG/2ML IJ SOLN
INTRAMUSCULAR | Status: AC
  Administered 2018-03-30: 50 ug via INTRAVENOUS
  Filled 2018-03-30: qty 2

## 2018-03-30 MED ORDER — FENTANYL CITRATE (PF) 100 MCG/2ML IJ SOLN
INTRAMUSCULAR | Status: AC
  Filled 2018-03-30: qty 2

## 2018-03-30 MED ORDER — LACTATED RINGERS IV SOLN
INTRAVENOUS | Status: DC
  Administered 2018-03-30 (×2): via INTRAVENOUS

## 2018-03-30 MED ORDER — METHOCARBAMOL 500 MG IVPB - SIMPLE MED
INTRAVENOUS | Status: AC
  Filled 2018-03-30: qty 50

## 2018-03-30 MED ORDER — OXYCODONE HCL 5 MG PO TABS
10.0000 mg | ORAL_TABLET | ORAL | Status: DC | PRN
  Administered 2018-03-30 – 2018-03-31 (×4): 15 mg via ORAL
  Filled 2018-03-30 (×6): qty 3

## 2018-03-30 MED ORDER — PROPOFOL 10 MG/ML IV BOLUS
INTRAVENOUS | Status: AC
  Filled 2018-03-30: qty 20

## 2018-03-30 MED ORDER — VALSARTAN-HYDROCHLOROTHIAZIDE 80-12.5 MG PO TABS: 1.0000 | ORAL_TABLET | Freq: Every day | ORAL | Status: DC

## 2018-03-30 MED ORDER — PROPOFOL 10 MG/ML IV BOLUS
INTRAVENOUS | Status: AC
  Filled 2018-03-30: qty 40

## 2018-03-30 MED ORDER — CEFAZOLIN SODIUM-DEXTROSE 2-3 GM-%(50ML) IV SOLR
INTRAVENOUS | Status: DC | PRN
  Administered 2018-03-30: 2 g via INTRAVENOUS

## 2018-03-30 MED ORDER — SODIUM CHLORIDE 0.9 % IV SOLN
INTRAVENOUS | Status: DC | PRN
  Administered 2018-03-30: 30 ug/min via INTRAVENOUS

## 2018-03-30 MED ORDER — IRBESARTAN 75 MG PO TABS
75.0000 mg | ORAL_TABLET | Freq: Every day | ORAL | Status: DC
  Administered 2018-03-31: 75 mg via ORAL
  Filled 2018-03-30: qty 1

## 2018-03-30 MED ORDER — OXYCODONE HCL 5 MG PO TABS
5.0000 mg | ORAL_TABLET | ORAL | Status: DC | PRN
  Administered 2018-03-30 – 2018-03-31 (×2): 10 mg via ORAL
  Filled 2018-03-30: qty 1
  Filled 2018-03-30: qty 2

## 2018-03-30 MED ORDER — PANTOPRAZOLE SODIUM 40 MG PO TBEC
40.0000 mg | DELAYED_RELEASE_TABLET | Freq: Every day | ORAL | Status: DC
  Administered 2018-03-30 – 2018-03-31 (×2): 40 mg via ORAL
  Filled 2018-03-30 (×2): qty 1

## 2018-03-30 MED ORDER — ALUM & MAG HYDROXIDE-SIMETH 200-200-20 MG/5ML PO SUSP: 30.0000 mL | ORAL | Status: DC | PRN

## 2018-03-30 MED ORDER — CHLORHEXIDINE GLUCONATE 4 % EX LIQD: 60.0000 mL | Freq: Once | CUTANEOUS | Status: DC

## 2018-03-30 MED ORDER — CEFAZOLIN SODIUM-DEXTROSE 1-4 GM/50ML-% IV SOLN
1.0000 g | Freq: Four times a day (QID) | INTRAVENOUS | Status: AC
  Administered 2018-03-30 – 2018-03-31 (×2): 1 g via INTRAVENOUS
  Filled 2018-03-30 (×2): qty 50

## 2018-03-30 MED ORDER — CEFAZOLIN SODIUM-DEXTROSE 2-4 GM/100ML-% IV SOLN
INTRAVENOUS | Status: AC
  Administered 2018-03-30: 2000 mg
  Filled 2018-03-30: qty 100

## 2018-03-30 MED ORDER — SODIUM CHLORIDE 0.9 % IR SOLN
Status: DC | PRN
  Administered 2018-03-30: 1000 mL

## 2018-03-30 MED ORDER — RIVAROXABAN 10 MG PO TABS
10.0000 mg | ORAL_TABLET | Freq: Every day | ORAL | Status: DC
  Administered 2018-03-31: 10 mg via ORAL
  Filled 2018-03-30: qty 1

## 2018-03-30 MED ORDER — OXYCODONE HCL 5 MG PO TABS
10.0000 mg | ORAL_TABLET | ORAL | Status: DC | PRN
  Administered 2018-03-30: 5 mg via ORAL

## 2018-03-30 MED ORDER — MENTHOL 3 MG MT LOZG: 1.0000 | LOZENGE | OROMUCOSAL | Status: DC | PRN

## 2018-03-30 MED ORDER — OXYCODONE HCL ER 20 MG PO T12A
20.0000 mg | EXTENDED_RELEASE_TABLET | Freq: Two times a day (BID) | ORAL | Status: DC
  Administered 2018-03-30 – 2018-03-31 (×2): 20 mg via ORAL
  Filled 2018-03-30 (×2): qty 1

## 2018-03-30 MED ORDER — EPHEDRINE SULFATE 50 MG/ML IJ SOLN
INTRAMUSCULAR | Status: DC | PRN
  Administered 2018-03-30: 10 mg via INTRAVENOUS

## 2018-03-30 MED ORDER — HYDROCHLOROTHIAZIDE 12.5 MG PO CAPS
12.5000 mg | ORAL_CAPSULE | Freq: Every day | ORAL | Status: DC
  Administered 2018-03-31: 12.5 mg via ORAL
  Filled 2018-03-30: qty 1

## 2018-03-30 MED ORDER — KETOROLAC TROMETHAMINE 15 MG/ML IJ SOLN
15.0000 mg | Freq: Once | INTRAMUSCULAR | Status: AC
  Administered 2018-03-30: 15 mg via INTRAVENOUS
  Filled 2018-03-30: qty 1

## 2018-03-30 MED ORDER — METHOCARBAMOL 500 MG IVPB - SIMPLE MED
500.0000 mg | Freq: Four times a day (QID) | INTRAVENOUS | Status: DC | PRN
  Administered 2018-03-30: 500 mg via INTRAVENOUS
  Filled 2018-03-30: qty 50

## 2018-03-30 MED ORDER — CLINDAMYCIN PHOSPHATE 900 MG/50ML IV SOLN
900.0000 mg | INTRAVENOUS | Status: DC
  Filled 2018-03-30: qty 50

## 2018-03-30 MED ORDER — ACETAMINOPHEN 325 MG PO TABS: 325.0000 mg | ORAL_TABLET | Freq: Four times a day (QID) | ORAL | Status: DC | PRN

## 2018-03-30 MED ORDER — TRANEXAMIC ACID-NACL 1000-0.7 MG/100ML-% IV SOLN
1000.0000 mg | INTRAVENOUS | Status: AC
  Administered 2018-03-30: 1000 mg via INTRAVENOUS
  Filled 2018-03-30: qty 100

## 2018-03-30 MED ORDER — METHOCARBAMOL 500 MG PO TABS
500.0000 mg | ORAL_TABLET | Freq: Four times a day (QID) | ORAL | Status: DC | PRN
  Administered 2018-03-31 (×3): 500 mg via ORAL
  Filled 2018-03-30 (×3): qty 1

## 2018-03-30 MED ORDER — ONDANSETRON HCL 4 MG/2ML IJ SOLN: 4.0000 mg | Freq: Once | INTRAMUSCULAR | Status: DC | PRN

## 2018-03-30 MED ORDER — STERILE WATER FOR IRRIGATION IR SOLN
Status: DC | PRN
  Administered 2018-03-30: 2000 mL

## 2018-03-30 MED ORDER — ONDANSETRON HCL 4 MG/2ML IJ SOLN
4.0000 mg | Freq: Four times a day (QID) | INTRAMUSCULAR | Status: DC | PRN
  Administered 2018-03-30: 4 mg via INTRAVENOUS
  Filled 2018-03-30: qty 2

## 2018-03-30 MED ORDER — SODIUM CHLORIDE 0.9 % IV SOLN
INTRAVENOUS | Status: DC
  Administered 2018-03-30: 75 mL/h via INTRAVENOUS

## 2018-03-30 MED ORDER — MONTELUKAST SODIUM 10 MG PO TABS
10.0000 mg | ORAL_TABLET | Freq: Every day | ORAL | Status: DC
  Administered 2018-03-30: 10 mg via ORAL
  Filled 2018-03-30: qty 1

## 2018-03-30 MED ORDER — OXYMETAZOLINE HCL 0.05 % NA SOLN
1.0000 | Freq: Two times a day (BID) | NASAL | Status: DC
  Administered 2018-03-30 – 2018-03-31 (×2): 1 via NASAL
  Filled 2018-03-30: qty 15

## 2018-03-30 MED ORDER — GABAPENTIN 300 MG PO CAPS
600.0000 mg | ORAL_CAPSULE | Freq: Every day | ORAL | Status: DC
  Administered 2018-03-30: 600 mg via ORAL
  Filled 2018-03-30: qty 2

## 2018-03-30 MED ORDER — ONDANSETRON HCL 4 MG/2ML IJ SOLN
INTRAMUSCULAR | Status: AC
  Filled 2018-03-30: qty 2

## 2018-03-30 MED ORDER — MIDAZOLAM HCL 2 MG/2ML IJ SOLN
INTRAMUSCULAR | Status: AC
  Filled 2018-03-30: qty 2

## 2018-03-30 MED ORDER — 0.9 % SODIUM CHLORIDE (POUR BTL) OPTIME
TOPICAL | Status: DC | PRN
  Administered 2018-03-30: 1000 mL

## 2018-03-30 MED ORDER — DOCUSATE SODIUM 100 MG PO CAPS
100.0000 mg | ORAL_CAPSULE | Freq: Two times a day (BID) | ORAL | Status: DC
  Administered 2018-03-30 – 2018-03-31 (×2): 100 mg via ORAL
  Filled 2018-03-30 (×2): qty 1

## 2018-03-30 MED ORDER — METOCLOPRAMIDE HCL 5 MG/ML IJ SOLN
5.0000 mg | Freq: Three times a day (TID) | INTRAMUSCULAR | Status: DC | PRN
  Administered 2018-03-30: 10 mg via INTRAVENOUS
  Filled 2018-03-30 (×2): qty 2

## 2018-03-30 MED ORDER — PROPOFOL 500 MG/50ML IV EMUL
INTRAVENOUS | Status: DC | PRN
  Administered 2018-03-30: 75 ug/kg/min via INTRAVENOUS

## 2018-03-30 MED ORDER — POLYETHYLENE GLYCOL 3350 17 G PO PACK: 17.0000 g | PACK | Freq: Every day | ORAL | Status: DC | PRN

## 2018-03-30 MED ORDER — DIPHENHYDRAMINE HCL 12.5 MG/5ML PO ELIX
12.5000 mg | ORAL_SOLUTION | ORAL | Status: DC | PRN
  Administered 2018-03-30 – 2018-03-31 (×3): 25 mg via ORAL
  Filled 2018-03-30 (×3): qty 10

## 2018-03-30 MED ORDER — ONDANSETRON HCL 4 MG PO TABS
4.0000 mg | ORAL_TABLET | Freq: Four times a day (QID) | ORAL | Status: DC | PRN
  Administered 2018-03-31: 4 mg via ORAL
  Filled 2018-03-30: qty 1

## 2018-03-30 MED ORDER — ESTRADIOL 1 MG PO TABS
2.0000 mg | ORAL_TABLET | Freq: Every day | ORAL | Status: DC
  Administered 2018-03-30: 2 mg via ORAL
  Filled 2018-03-30 (×2): qty 2

## 2018-03-30 MED ORDER — HYDROMORPHONE HCL 1 MG/ML IJ SOLN
0.5000 mg | INTRAMUSCULAR | Status: DC | PRN
  Administered 2018-03-30: 1 mg via INTRAVENOUS
  Filled 2018-03-30: qty 1

## 2018-03-30 MED ORDER — AMLODIPINE BESYLATE 5 MG PO TABS
5.0000 mg | ORAL_TABLET | Freq: Every day | ORAL | Status: DC
  Administered 2018-03-31: 5 mg via ORAL
  Filled 2018-03-30: qty 1

## 2018-03-30 MED ORDER — FENTANYL CITRATE (PF) 100 MCG/2ML IJ SOLN
25.0000 ug | INTRAMUSCULAR | Status: DC | PRN
  Administered 2018-03-30 (×3): 50 ug via INTRAVENOUS

## 2018-03-30 MED ORDER — METOCLOPRAMIDE HCL 5 MG PO TABS: 5.0000 mg | ORAL_TABLET | Freq: Three times a day (TID) | ORAL | Status: DC | PRN

## 2018-03-30 MED ORDER — PHENOL 1.4 % MT LIQD: 1.0000 | OROMUCOSAL | Status: DC | PRN

## 2018-03-30 MED ORDER — FENTANYL CITRATE (PF) 100 MCG/2ML IJ SOLN
INTRAMUSCULAR | Status: DC | PRN
  Administered 2018-03-30: 100 ug via INTRAVENOUS

## 2018-03-30 MED ORDER — ZOLPIDEM TARTRATE 5 MG PO TABS: 5.0000 mg | ORAL_TABLET | Freq: Every evening | ORAL | Status: DC | PRN

## 2018-03-30 SURGICAL SUPPLY — 37 items
BAG ZIPLOCK 12X15 (MISCELLANEOUS) IMPLANT
BENZOIN TINCTURE PRP APPL 2/3 (GAUZE/BANDAGES/DRESSINGS) ×1 IMPLANT
BLADE SAW SGTL 18X1.27X75 (BLADE) ×2 IMPLANT
BLADE SURG SZ10 CARB STEEL (BLADE) ×4 IMPLANT
COLLAR OFFSET CORAIL SZ 9 HIP (Stem) IMPLANT
CORAIL OFFSET COLLAR SZ 9 HIP (Stem) ×2 IMPLANT
COVER PERINEAL POST (MISCELLANEOUS) ×2 IMPLANT
COVER SURGICAL LIGHT HANDLE (MISCELLANEOUS) ×2 IMPLANT
COVER WAND RF STERILE (DRAPES) ×1 IMPLANT
DRAPE STERI IOBAN 125X83 (DRAPES) ×2 IMPLANT
DRAPE U-SHAPE 47X51 STRL (DRAPES) ×4 IMPLANT
DRSG AQUACEL AG ADV 3.5X10 (GAUZE/BANDAGES/DRESSINGS) ×2 IMPLANT
DURAPREP 26ML APPLICATOR (WOUND CARE) ×2 IMPLANT
ELECT REM PT RETURN 15FT ADLT (MISCELLANEOUS) ×2 IMPLANT
GAUZE XEROFORM 1X8 LF (GAUZE/BANDAGES/DRESSINGS) IMPLANT
GLOVE BIO SURGEON STRL SZ7.5 (GLOVE) ×2 IMPLANT
GLOVE BIOGEL PI IND STRL 8 (GLOVE) ×2 IMPLANT
GLOVE BIOGEL PI INDICATOR 8 (GLOVE) ×2
GLOVE ECLIPSE 8.0 STRL XLNG CF (GLOVE) ×2 IMPLANT
GOWN STRL REUS W/TWL XL LVL3 (GOWN DISPOSABLE) ×4 IMPLANT
HANDPIECE INTERPULSE COAX TIP (DISPOSABLE) ×1
HEAD CERAMIC DELTA 36 PLUS 1.5 (Hips) ×1 IMPLANT
HOLDER FOLEY CATH W/STRAP (MISCELLANEOUS) ×2 IMPLANT
LINER ACETAB NEUTRAL 36ID 520D (Liner) ×1 IMPLANT
PACK ANTERIOR HIP CUSTOM (KITS) ×2 IMPLANT
PIN SECTOR W/GRIP ACE CUP 52MM (Hips) ×1 IMPLANT
SET HNDPC FAN SPRY TIP SCT (DISPOSABLE) ×1 IMPLANT
STAPLER VISISTAT 35W (STAPLE) IMPLANT
STRIP CLOSURE SKIN 1/2X4 (GAUZE/BANDAGES/DRESSINGS) ×1 IMPLANT
SUT ETHIBOND NAB CT1 #1 30IN (SUTURE) ×2 IMPLANT
SUT MNCRL AB 4-0 PS2 18 (SUTURE) ×1 IMPLANT
SUT VIC AB 0 CT1 36 (SUTURE) ×2 IMPLANT
SUT VIC AB 1 CT1 36 (SUTURE) ×2 IMPLANT
SUT VIC AB 2-0 CT1 27 (SUTURE) ×2
SUT VIC AB 2-0 CT1 TAPERPNT 27 (SUTURE) ×2 IMPLANT
TRAY FOLEY MTR SLVR 16FR STAT (SET/KITS/TRAYS/PACK) ×2 IMPLANT
YANKAUER SUCT BULB TIP 10FT TU (MISCELLANEOUS) ×2 IMPLANT

## 2018-03-30 NOTE — H&P (Signed)
TOTAL HIP ADMISSION H&P  Patient is admitted for left total hip arthroplasty.  Subjective:  Chief Complaint: left hip pain  HPI: Paula Moses, 53 y.o. female, has a history of pain and functional disability in the left hip(s) due to arthritis and patient has failed non-surgical conservative treatments for greater than 12 weeks to include NSAID's and/or analgesics, corticosteriod injections, flexibility and strengthening excercises, weight reduction as appropriate and activity modification.  Onset of symptoms was gradual starting 1 years ago with rapidlly worsening course since that time.The patient noted no past surgery on the left hip(s).  Patient currently rates pain in the left hip at 10 out of 10 with activity. Patient has night pain, worsening of pain with activity and weight bearing, trendelenberg gait, pain that interfers with activities of daily living and pain with passive range of motion. Patient has evidence of subchondral cysts, subchondral sclerosis, periarticular osteophytes and joint space narrowing by imaging studies. This condition presents safety issues increasing the risk of falls.  There is no current active infection.  Patient Active Problem List   Diagnosis Date Noted  . Unilateral primary osteoarthritis, left hip 01/22/2018  . Arthritis of left hip 12/20/2017  . Degenerative disc disease, lumbar 12/20/2017  . Status post total hip replacement, right 10/26/2017  . Femoroacetabular impingement 05/05/2017  . Surgical menopause 01/26/2017  . Status post vaginal hysterectomy BSO 01/26/2017  . Hypertension   . High cholesterol   . Fibroids   . Trigeminal neuralgia   . Left-sided face pain   . Neck pain on left side 07/16/2013  . Left facial pain 07/16/2013  . Paresthesia 07/16/2013   Past Medical History:  Diagnosis Date  . Abnormal Pap smear of cervix   . Abnormal vaginal bleeding   . Anxiety   . Arthritis    neck and body  . Complication of anesthesia    IT  TAKES ALOT OF ANESTHESIA TO KEEP PT SEDATED  . Cyst of vulva   . Dyspareunia   . External hemorrhoid   . Fibroids   . GERD (gastroesophageal reflux disease)   . Headache 2019   migraines, becoming more frequent  . High cholesterol   . History of UTI   . Hypertension   . Left-sided face pain   . Neck pain   . Paresthesia   . Rectocele   . Surgical menopause   . Trigeminal neuralgia   . Uterus prolapse     Past Surgical History:  Procedure Laterality Date  . AUGMENTATION MAMMAPLASTY Bilateral 2006  . BREAST ENHANCEMENT SURGERY    . CHOLECYSTECTOMY    . HIP ARTHROSCOPY Right 05/05/2017   Procedure: ARTHROSCOPY HIP;  Surgeon: Leim Fabry, MD;  Location: [facility];  Service: Orthopedics;  Laterality: Right;  . LAPAROSCOPY     X2  . TOTAL HIP ARTHROPLASTY Right 10/26/2017   Procedure: TOTAL HIP ARTHROPLASTY ANTERIOR APPROACH;  Surgeon: [name], MD;  Location: [facility];  Service: Orthopedics;  Laterality: Right;  . tummy tuck     . VAGINAL HYSTERECTOMY     lahv bso    Current Facility-Administered Medications  Medication Dose Route Frequency Provider Last Rate Last Dose  . chlorhexidine (HIBICLENS) 4 % liquid 4 application  60 mL Topical Once Erskine Emery W, PA-C      . clindamycin (CLEOCIN) IVPB 900 mg  900 mg Intravenous On Call to OR Pete Pelt, PA-C      . lactated ringers infusion   Intravenous Continuous Audry Pili,  MD 50 mL/hr at 03/30/18 1210    . tranexamic acid (CYKLOKAPRON) IVPB 1,000 mg  1,000 mg Intravenous To OR Pete Pelt, PA-C       Allergies  Allergen Reactions  . Aspirin Shortness Of Breath and Other (See Comments)    congestion  . Codeine Itching and Other (See Comments)    Facial itching   . Percocet [Oxycodone-Acetaminophen] Itching and Other (See Comments)    Facial itching   . Phenergan [Promethazine Hcl] Itching and Other (See Comments)    Facial itching   . Tape Other (See Comments)    Surgical tape caused blisters     Social History   Tobacco Use  . Smoking status: Never Smoker  . Smokeless tobacco: Never Used  Substance Use Topics  . Alcohol use: Yes    Comment: occas    Family History  Problem Relation Age of Onset  . Brain cancer Mother   . Dementia Father   . Breast cancer Neg Hx      Review of Systems  Musculoskeletal: Positive for back pain and joint pain.  All other systems reviewed and are negative.   Objective:  Physical Exam  Constitutional: She is oriented to person, place, and time. She appears well-developed and well-nourished.  HENT:  Head: Normocephalic and atraumatic.  Eyes: Pupils are equal, round, and reactive to light.  Neck: Normal range of motion.  Cardiovascular: Normal rate.  Respiratory: Effort normal.  GI: Soft.  Musculoskeletal:     Left hip: She exhibits decreased range of motion, decreased strength, tenderness and bony tenderness.  Neurological: She is alert and oriented to person, place, and time.  Skin: Skin is warm and dry.  Psychiatric: She has a normal mood and affect.    Vital signs in last 24 hours: Temp:  [98.3 F (36.8 C)] 98.3 F (36.8 C) (01/17 1147) Pulse Rate:  [61] 61 (01/17 1147) Resp:  [16] 16 (01/17 1147) BP: (124)/(82) 124/82 (01/17 1147) SpO2:  [100 %] 100 % (01/17 1147) Weight:  [83.1 kg] 83.1 kg (01/17 1211)  Labs:   Estimated body mass index is 31.45 kg/m as calculated from the following:   Height as of this encounter: 5\' 4"  (1.626 m).   Weight as of this encounter: 83.1 kg.   Imaging Review Plain radiographs demonstrate severe degenerative joint disease of the left hip(s). The bone quality appears to be excellent for age and reported activity level.    Preoperative templating of the joint replacement has been completed, documented, and submitted to the Operating Room personnel in order to optimize intra-operative equipment management.     Assessment/Plan:  End stage arthritis, left hip(s)  The patient  history, physical examination, clinical judgement of the provider and imaging studies are consistent with end stage degenerative joint disease of the left hip(s) and total hip arthroplasty is deemed medically necessary. The treatment options including medical management, injection therapy, arthroscopy and arthroplasty were discussed at length. The risks and benefits of total hip arthroplasty were presented and reviewed. The risks due to aseptic loosening, infection, stiffness, dislocation/subluxation,  thromboembolic complications and other imponderables were discussed.  The patient acknowledged the explanation, agreed to proceed with the plan and consent was signed. Patient is being admitted for inpatient treatment for surgery, pain control, PT, OT, prophylactic antibiotics, VTE prophylaxis, progressive ambulation and ADL's and discharge planning.The patient is planning to be discharged home with home health services

## 2018-03-30 NOTE — Discharge Instructions (Addendum)
Information on my medicine - XARELTO (Rivaroxaban)  Why was Xarelto prescribed for you? Xarelto was prescribed for you to reduce the risk of blood clots forming after orthopedic surgery. The medical term for these abnormal blood clots is venous thromboembolism (VTE).  What do you need to know about xarelto ? Take your Xarelto ONCE DAILY at the same time every day. You may take it either with or without food.  If you have difficulty swallowing the tablet whole, you may crush it and mix in applesauce just prior to taking your dose.  Take Xarelto exactly as prescribed by your doctor and DO NOT stop taking Xarelto without talking to the doctor who prescribed the medication.  Stopping without other VTE prevention medication to take the place of Xarelto may increase your risk of developing a clot.  INSTRUCTIONS AFTER JOINT REPLACEMENT   o Remove items at home which could result in a fall. This includes throw rugs or furniture in walking pathways o ICE to the affected joint every three hours while awake for 30 minutes at a time, for at least the first 3-5 days, and then as needed for pain and swelling.  Continue to use ice for pain and swelling. You may notice swelling that will progress down to the foot and ankle.  This is normal after surgery.  Elevate your leg when you are not up walking on it.   o Continue to use the breathing machine you got in the hospital (incentive spirometer) which will help keep your temperature down.  It is common for your temperature to cycle up and down following surgery, especially at night when you are not up moving around and exerting yourself.  The breathing machine keeps your lungs expanded and your temperature down.   DIET:  As you were doing prior to hospitalization, we recommend a well-balanced diet.  DRESSING / WOUND CARE / SHOWERING  Keep the surgical dressing until follow up.  The dressing is water proof, so you can shower without any extra covering.   IF THE DRESSING FALLS OFF or the wound gets wet inside, change the dressing with sterile gauze.  Please use good hand washing techniques before changing the dressing.  Do not use any lotions or creams on the incision until instructed by your surgeon.    ACTIVITY  o Increase activity slowly as tolerated, but follow the weight bearing instructions below.   o No driving for 6 weeks or until further direction given by your physician.  You cannot drive while taking narcotics.  o No lifting or carrying greater than 10 lbs. until further directed by your surgeon. o Avoid periods of inactivity such as sitting longer than an hour when not asleep. This helps prevent blood clots.  o You may return to work once you are authorized by your doctor.     WEIGHT BEARING   Weight bearing as tolerated with assist device (walker, cane, etc) as directed, use it as long as suggested by your surgeon or therapist, typically at least 4-6 weeks.   EXERCISES  Results after joint replacement surgery are often greatly improved when you follow the exercise, range of motion and muscle strengthening exercises prescribed by your doctor. Safety measures are also important to protect the joint from further injury. Any time any of these exercises cause you to have increased pain or swelling, decrease what you are doing until you are comfortable again and then slowly increase them. If you have problems or questions, call your caregiver or physical  therapist for advice.   Rehabilitation is important following a joint replacement. After just a few days of immobilization, the muscles of the leg can become weakened and shrink (atrophy).  These exercises are designed to build up the tone and strength of the thigh and leg muscles and to improve motion. Often times heat used for twenty to thirty minutes before working out will loosen up your tissues and help with improving the range of motion but do not use heat for the first two weeks  following surgery (sometimes heat can increase post-operative swelling).   These exercises can be done on a training (exercise) mat, on the floor, on a table or on a bed. Use whatever works the best and is most comfortable for you.    Use music or television while you are exercising so that the exercises are a pleasant break in your day. This will make your life better with the exercises acting as a break in your routine that you can look forward to.   Perform all exercises about fifteen times, three times per day or as directed.  You should exercise both the operative leg and the other leg as well.  Exercises include:    Quad Sets - Tighten up the muscle on the front of the thigh (Quad) and hold for 5-10 seconds.    Straight Leg Raises - With your knee straight (if you were given a brace, keep it on), lift the leg to 60 degrees, hold for 3 seconds, and slowly lower the leg.  Perform this exercise against resistance later as your leg gets stronger.   Leg Slides: Lying on your back, slowly slide your foot toward your buttocks, bending your knee up off the floor (only go as far as is comfortable). Then slowly slide your foot back down until your leg is flat on the floor again.   Angel Wings: Lying on your back spread your legs to the side as far apart as you can without causing discomfort.   Hamstring Strength:  Lying on your back, push your heel against the floor with your leg straight by tightening up the muscles of your buttocks.  Repeat, but this time bend your knee to a comfortable angle, and push your heel against the floor.  You may put a pillow under the heel to make it more comfortable if necessary.   A rehabilitation program following joint replacement surgery can speed recovery and prevent re-injury in the future due to weakened muscles. Contact your doctor or a physical therapist for more information on knee rehabilitation.    CONSTIPATION  Constipation is defined medically as fewer  than three stools per week and severe constipation as less than one stool per week.  Even if you have a regular bowel pattern at home, your normal regimen is likely to be disrupted due to multiple reasons following surgery.  Combination of anesthesia, postoperative narcotics, change in appetite and fluid intake all can affect your bowels.   YOU MUST use at least one of the following options; they are listed in order of increasing strength to get the job done.  They are all available over the counter, and you may need to use some, POSSIBLY even all of these options:    Drink plenty of fluids (prune juice may be helpful) and high fiber foods Colace 100 mg by mouth twice a day  Senokot for constipation as directed and as needed Dulcolax (bisacodyl), take with full glass of water  Miralax (polyethylene glycol) once  or twice a day as needed.  If you have tried all these things and are unable to have a bowel movement in the first 3-4 days after surgery call either your surgeon or your primary doctor.    If you experience loose stools or diarrhea, hold the medications until you stool forms back up.  If your symptoms do not get better within 1 week or if they get worse, check with your doctor.  If you experience "the worst abdominal pain ever" or develop nausea or vomiting, please contact the office immediately for further recommendations for treatment.   ITCHING:  If you experience itching with your medications, try taking only a single pain pill, or even half a pain pill at a time.  You can also use Benadryl over the counter for itching or also to help with sleep.   TED HOSE STOCKINGS:  Use stockings on both legs until for at least 2 weeks or as directed by physician office. They may be removed at night for sleeping.  MEDICATIONS:  See your medication summary on the After Visit Summary that nursing will review with you.  You may have some home medications which will be placed on hold until you complete  the course of blood thinner medication.  It is important for you to complete the blood thinner medication as prescribed.  PRECAUTIONS:  If you experience chest pain or shortness of breath - call 911 immediately for transfer to the hospital emergency department.   If you develop a fever greater that 101 F, purulent drainage from wound, increased redness or drainage from wound, foul odor from the wound/dressing, or calf pain - CONTACT YOUR SURGEON.                                                   FOLLOW-UP APPOINTMENTS:  If you do not already have a post-op appointment, please call the office for an appointment to be seen by your surgeon.  Guidelines for how soon to be seen are listed in your After Visit Summary, but are typically between 1-4 weeks after surgery.  OTHER INSTRUCTIONS:   Knee Replacement:  Do not place pillow under knee, focus on keeping the knee straight while resting. CPM instructions: 0-90 degrees, 2 hours in the morning, 2 hours in the afternoon, and 2 hours in the evening. Place foam block, curve side up under heel at all times except when in CPM or when walking.  DO NOT modify, tear, cut, or change the foam block in any way.  MAKE SURE YOU:   Understand these instructions.   Get help right away if you are not doing well or get worse.    Thank you for letting us be a part of your medical care team.  It is a privilege we respect greatly.  We hope these instructions will help you stay on track for a fast and full recovery!    After discharge, you should have regular check-up appointments with your healthcare provider that is prescribing your Xarelto.    What do you do if you miss a dose? If you miss a dose, take it as soon as you remember on the same day then continue your regularly scheduled once daily regimen the next day. Do not take two doses of Xarelto on the same day.   Important Safety Information A possible  side effect of Xarelto is bleeding. You should call  your healthcare provider right away if you experience any of the following: ? Bleeding from an injury or your nose that does not stop. ? Unusual colored urine (red or dark brown) or unusual colored stools (red or black). ? Unusual bruising for unknown reasons. ? A serious fall or if you hit your head (even if there is no bleeding).  Some medicines may interact with Xarelto and might increase your risk of bleeding while on Xarelto. To help avoid this, consult your healthcare provider or pharmacist prior to using any new prescription or non-prescription medications, including herbals, vitamins, non-steroidal anti-inflammatory drugs (NSAIDs) and supplements.  This website has more information on Xarelto: [email or web address]

## 2018-03-30 NOTE — Anesthesia Postprocedure Evaluation (Signed)
Anesthesia Post Note  Patient: Paula Moses  Procedure(s) Performed: LEFT TOTAL HIP ARTHROPLASTY ANTERIOR APPROACH (Left Hip)     Patient location during evaluation: PACU Anesthesia Type: Spinal Level of consciousness: awake and alert Pain management: pain level controlled Vital Signs Assessment: post-procedure vital signs reviewed and stable Respiratory status: spontaneous breathing and respiratory function stable Cardiovascular status: blood pressure returned to baseline and stable Postop Assessment: spinal receding and no apparent nausea or vomiting Anesthetic complications: no    Last Vitals:  Vitals:   03/30/18 1700 03/30/18 1808  BP: (!) 105/56 111/75  Pulse: 61 63  Resp: 17 16  Temp: (!) 36.3 C (!) 36.3 C  SpO2: 100% 100%    Last Pain:  Vitals:   03/30/18 1808  TempSrc: Oral  PainSc:                  Audry Pili

## 2018-03-30 NOTE — Anesthesia Preprocedure Evaluation (Addendum)
Anesthesia Evaluation  Patient identified by MRN, date of birth, ID band Patient awake    Reviewed: Allergy & Precautions, NPO status , Patient's Chart, lab work & pertinent test results  History of Anesthesia Complications Negative for: history of anesthetic complications  Airway Mallampati: III  TM Distance: >3 FB Neck ROM: Full    Dental  (+) Dental Advisory Given, Teeth Intact   Pulmonary neg pulmonary ROS,    breath sounds clear to auscultation       Cardiovascular hypertension, Pt. on medications  Rhythm:Regular Rate:Normal     Neuro/Psych  Headaches, PSYCHIATRIC DISORDERS Anxiety  Neuromuscular disease (trigeminal neuralgia)    GI/Hepatic Neg liver ROS, GERD  Medicated and Controlled,  Endo/Other   Obesity   Renal/GU negative Renal ROS     Musculoskeletal  (+) Arthritis ,   Abdominal   Peds  Hematology negative hematology ROS (+)   Anesthesia Other Findings   Reproductive/Obstetrics                            Anesthesia Physical Anesthesia Plan  ASA: II  Anesthesia Plan: Spinal   Post-op Pain Management:    Induction:   PONV Risk Score and Plan: 2 and Treatment may vary due to age or medical condition and Propofol infusion  Airway Management Planned: Natural Airway and Simple Face Mask  Additional Equipment: None  Intra-op Plan:   Post-operative Plan:   Informed Consent: I have reviewed the patients History and Physical, chart, labs and discussed the procedure including the risks, benefits and alternatives for the proposed anesthesia with the patient or authorized representative who has indicated his/her understanding and acceptance.       Plan Discussed with: CRNA and Anesthesiologist  Anesthesia Plan Comments: (Labs reviewed, platelets acceptable. Discussed risks and benefits of spinal, including spinal/epidural hematoma, infection, failed block, and PDPH.  Patient expressed understanding and wished to proceed. )       Anesthesia Quick Evaluation

## 2018-03-30 NOTE — Op Note (Signed)
NAME: Paula Moses, Paula Moses MEDICAL RECORD WN:[id number] ACCOUNT [id number] DATE OF BIRTH:[birth year] FACILITY: [facility] LOCATION: [facility] PHYSICIAN:Andris Brothers Kerry Fort, MD  OPERATIVE REPORT  DATE OF PROCEDURE:  03/30/2018  PREOPERATIVE DIAGNOSIS:  Primary osteoarthritis and degenerative joint disease, left hip.  POSTOPERATIVE DIAGNOSIS:  Primary osteoarthritis and degenerative joint disease, left hip.  PROCEDURE:  Left total hip arthroplasty through direct anterior approach.  IMPLANTS:  DePuy Sector Gription acetabular component size 52, size 36+0 neutral polyethylene liner, size 9 Corail femoral component with high offset, size 36+1.5 ceramic hip ball.  SURGEON:  Lind Guest. Ninfa Linden, MD  ASSISTANT:  Erskine Emery, PA-C.  ANESTHESIA:  Spinal.  ANTIBIOTICS:  Two grams IV Ancef.  ESTIMATED BLOOD LOSS:  440 mL  COMPLICATIONS:  None.  INDICATIONS:  The patient is a pleasant 53 year old female with debilitating arthritis involving her left hip.  This actually was found on MRI and on her plain films.  Her clinical exam also correlates with this.  Actually, last year she had a right  total hip arthro[place] the [place] due to similar circumstances.  On that hip, they had performed at least 2 arthroscopic interventions and then she eventually had a total hip arthroplasty.  I did obtain an MRI of her left hip due to the  pain she was having but I want to see what the disease was like an MRI did show areas of full thickness cartilage loss in spite of her x-rays being more normal.  Given the severity of her pain, we did attempt a fluoroscopic-guided injection and this did  give her relief, but it only lasted a short-term.  At this point, she does wish to proceed with a total hip arthroplasty.  We had a long and thorough discussion about the risk of acute blood loss anemia, nerve or vessel injury, fracture, infection,  dislocation, DVT and implant failure.  She understands her  goals are to decrease pain, improve mobility and overall improve quality of life.  DESCRIPTION OF PROCEDURE:  After informed consent was obtained and appropriate left hip was marked.  She was brought to the operating room and sat up on a stretcher where spinal  anesthesia was obtained.  She was then laid in the supine position.  I was  able to feel her leg length and felt that she was equal in leg length.  Foley catheter was placed and then I placed traction boots on both her feet and I placed her supine on the Hana fracture table, the perineal post in place and both legs in line  skeletal traction device and no traction applied.  Her left operative hip was prepped and draped with DuraPrep and sterile drapes.  A time-out was called to identify correct patient, correct left hip.  We then made an incision just inferior and posterior  to the anterior superior iliac spine and carried this obliquely down the leg.  We dissected down tensor fascia lata muscle.  Tensor fascia was then divided longitudinally to proceed with direct anterior approach to the hip.  We identified and cauterized  circumflex vessels and identified the hip capsule, opened the hip capsule in an L-type format, finding moderate joint effusion and surprisingly significant periarticular osteophytes around the femoral head and neck.  We placed Cobra retractors around  the medial and lateral femoral neck and then made our femoral neck cut with an oscillating saw just proximal to the lesser trochanter and completed this with an osteotome.  The original cut was done with an oscillating  saw.  I then placed a corkscrew  guide in the femoral head and removed the femoral head in its entirety and found a wide area that was devoid of cartilage.  We then placed a bent Hohmann over the medial acetabular rim and removed remnants of the acetabular labrum and other debris.  We  then began reaming under direct visualization from a size 44 reamer and going in  stepwise increments and went up to a size 51 with all reamers under direct visualization, the last reamer under direct fluoroscopy, so I could obtain my depth of reaming by  inclination and anteversion.  Once I was pleased with this, I placed the real DePuy Sector Gription acetabular component size 52 and I went with a 36+0 neutral polyethylene liner based on preoperative templating as well.  I then turned attention to the  femur.  With the leg externally rotated to 120 degrees, extended and adducted, we were able to place a Mueller retractor medially and Homans retractor above the greater trochanter.  We released lateral joint capsule and used a box-cutting osteotome to  enter femoral canal and a rongeur to lateralize then began broaching from a size 8 broach using Corail broaching system going up to a size only 9.  The 9 filled the canal.  She is a young person.  We then trialed a standard offset femoral neck and a  32+1.5 hip ball and reduced this in the acetabulum.  I was pleased with stability, but I felt like she needed more offset.  We dislocated the hip and removed the trial components.  We then with the real Corail high offset femoral component size 9 and the  real 36+1.5 ceramic hip ball and again reduced this in the acetabulum.  We felt it was stable in range of motion in her leg length and offset looked good under fluoroscopy as well.  We then irrigated the soft tissue with normal saline solution using  pulsatile lavage.  We closed the joint capsule with interrupted #1 Ethibond suture, followed by running 0 Vicryl and tensor fascia, 0 Vicryl was used to the deep tissue, 2-0 Vicryl closed the subcutaneous tissue and a 4-0 Monocryl subcuticular stitch.   Steri-Strips were applied and Aquacel dressing was placed.  She was then taken off the Hana table and taken to recovery room in stable condition.  All final counts were correct.  There were no complications noted.  Of note, Benita Stabile, PA-C, assisted  in  the entire case.  His assistance was crucial for facilitating all aspects of this case.  TN/NUANCE  D:03/30/2018 T:03/30/2018 JOB:[id number]

## 2018-03-30 NOTE — Anesthesia Procedure Notes (Signed)
Spinal  Patient location during procedure: OR Start time: 03/30/2018 2:26 PM End time: 03/30/2018 2:29 PM Staffing Anesthesiologist: Audry Pili, MD Performed: anesthesiologist  Preanesthetic Checklist Completed: patient identified, surgical consent, pre-op evaluation, timeout performed, IV checked, risks and benefits discussed and monitors and equipment checked Spinal Block Patient position: sitting Prep: DuraPrep Patient monitoring: heart rate, cardiac monitor, continuous pulse ox and blood pressure Approach: midline Location: L3-4 Injection technique: single-shot Needle Needle type: Pencan  Needle gauge: 24 G Additional Notes Consent was obtained prior to the procedure with all questions answered and concerns addressed. Risks including, but not limited to, bleeding, infection, nerve damage, paralysis, failed block, inadequate analgesia, allergic reaction, high spinal, itching, and headache were discussed and the patient wished to proceed. Functioning IV was confirmed and monitors were applied. Sterile prep and drape, including hand hygiene, mask, and sterile gloves were used. The patient was positioned and the spine was prepped. The skin was anesthetized with lidocaine. Free flow of clear CSF was obtained prior to injecting local anesthetic into the CSF. The spinal needle aspirated freely following injection. The needle was carefully withdrawn. The patient tolerated the procedure well.   Renold Don, MD

## 2018-03-30 NOTE — Brief Op Note (Signed)
03/30/2018  3:55 PM  PATIENT:  Paula Moses  53 y.o. female  PRE-OPERATIVE DIAGNOSIS:  osteoarthritis left hip  POST-OPERATIVE DIAGNOSIS:  osteoarthritis left hip  PROCEDURE:  Procedure(s): LEFT TOTAL HIP ARTHROPLASTY ANTERIOR APPROACH (Left)  SURGEON:  Surgeon(s) and Role:    [name], MD - Primary  PHYSICIAN ASSISTANT: Benita Stabile, PA-C  ANESTHESIA:   spinal  EBL:  300 mL   COUNTS:  YES  TOURNIQUET:  * No tourniquets in log *  DICTATION: .Other Dictation: Dictation Number [id number]  PLAN OF CARE: Admit to inpatient   PATIENT DISPOSITION:  PACU - hemodynamically stable.   Delay start of Pharmacological VTE agent (>24hrs) due to surgical blood loss or risk of bleeding: no

## 2018-03-30 NOTE — Transfer of Care (Signed)
Immediate Anesthesia Transfer of Care Note  Patient: Paula Moses  Procedure(s) Performed: LEFT TOTAL HIP ARTHROPLASTY ANTERIOR APPROACH (Left Hip)  Patient Location: PACU  Anesthesia Type:Spinal  Level of Consciousness: awake, alert  and oriented  Airway & Oxygen Therapy: Patient Spontanous Breathing and Patient connected to face mask oxygen  Post-op Assessment: Report given to RN and Post -op Vital signs reviewed and stable  Post vital signs: Reviewed and stable  Last Vitals:  Vitals Value Taken Time  BP 106/59 03/30/2018  4:11 PM  Temp    Pulse 70 03/30/2018  4:13 PM  Resp 18 03/30/2018  4:13 PM  SpO2 100 % 03/30/2018  4:13 PM  Vitals shown include unvalidated device data.  Last Pain:  Vitals:   03/30/18 1211  TempSrc:   PainSc: 0-No pain      Patients Stated Pain Goal: 3 (80/32/12 2482)  Complications: No apparent anesthesia complications

## 2018-03-31 ENCOUNTER — Other Ambulatory Visit: Payer: Self-pay

## 2018-03-31 LAB — CBC
HCT: 30.4 % — ABNORMAL LOW (ref 36.0–46.0)
Hemoglobin: 10.3 g/dL — ABNORMAL LOW (ref 12.0–15.0)
MCH: 30.7 pg (ref 26.0–34.0)
MCHC: 33.9 g/dL (ref 30.0–36.0)
MCV: 90.7 fL (ref 80.0–100.0)
Platelets: 196 10*3/uL (ref 150–400)
RBC: 3.35 MIL/uL — ABNORMAL LOW (ref 3.87–5.11)
RDW: 12.8 % (ref 11.5–15.5)
WBC: 9.5 10*3/uL (ref 4.0–10.5)
nRBC: 0 % (ref 0.0–0.2)

## 2018-03-31 LAB — BASIC METABOLIC PANEL
Anion gap: 10 (ref 5–15)
BUN: 8 mg/dL (ref 6–20)
CHLORIDE: 96 mmol/L — AB (ref 98–111)
CO2: 26 mmol/L (ref 22–32)
Calcium: 8 mg/dL — ABNORMAL LOW (ref 8.9–10.3)
Creatinine, Ser: 0.77 mg/dL (ref 0.44–1.00)
GFR calc Af Amer: >60 mL/min (ref >60)
GFR calc non Af Amer: >60 mL/min (ref >60)
GLUCOSE: 98 mg/dL (ref 70–99)
Potassium: 3.3 mmol/L — ABNORMAL LOW (ref 3.5–5.1)
Sodium: 132 mmol/L — ABNORMAL LOW (ref 135–145)

## 2018-03-31 MED ORDER — OXYCODONE HCL 5 MG PO TABS: 5.0000 mg | ORAL_TABLET | ORAL | 0 refills | Status: DC | PRN

## 2018-03-31 MED ORDER — METHOCARBAMOL 500 MG PO TABS: 500.0000 mg | ORAL_TABLET | Freq: Four times a day (QID) | ORAL | 0 refills | Status: DC | PRN

## 2018-03-31 MED ORDER — RIVAROXABAN 10 MG PO TABS: 10.0000 mg | ORAL_TABLET | Freq: Every day | ORAL | 0 refills | Status: DC

## 2018-03-31 NOTE — Discharge Summary (Signed)
Patient ID: Paula Moses MRN: [id number] DOB/AGE: 51-18-1967 51 y.o.  Admit date: 03/30/2018 Discharge date: 03/31/2018  Admission Diagnoses:  Principal Problem:   Unilateral primary osteoarthritis, left hip Active Problems:   Status post total replacement of left hip   Discharge Diagnoses:  Same  Past Medical History:  Diagnosis Date  . Abnormal Pap smear of cervix   . Abnormal vaginal bleeding   . Anxiety   . Arthritis    neck and body  . Complication of anesthesia    IT TAKES ALOT OF ANESTHESIA TO KEEP PT SEDATED  . Cyst of vulva   . Dyspareunia   . External hemorrhoid   . Fibroids   . GERD (gastroesophageal reflux disease)   . Headache 2019   migraines, becoming more frequent  . High cholesterol   . History of UTI   . Hypertension   . Left-sided face pain   . Neck pain   . Paresthesia   . Rectocele   . Surgical menopause   . Trigeminal neuralgia   . Uterus prolapse     Surgeries: Procedure(s): LEFT TOTAL HIP ARTHROPLASTY ANTERIOR APPROACH on 03/30/2018   Consultants:   Discharged Condition: Improved  Hospital Course: Paula Moses is an 53 y.o. female who was admitted 03/30/2018 for operative treatment ofUnilateral primary osteoarthritis, left hip. Patient has severe unremitting pain that affects sleep, daily activities, and work/hobbies. After pre-op clearance the patient was taken to the operating room on 03/30/2018 and underwent  Procedure(s): LEFT TOTAL HIP ARTHROPLASTY ANTERIOR APPROACH.    Patient was given perioperative antibiotics:  Anti-infectives (From admission, onward)   Start     Dose/Rate Route Frequency Ordered Stop   03/30/18 2030  ceFAZolin (ANCEF) IVPB 1 g/50 mL premix     1 g 100 mL/hr over 30 Minutes Intravenous Every 6 hours 03/30/18 1717 03/31/18 0302   03/30/18 1416  ceFAZolin (ANCEF) 2-4 GM/100ML-% IVPB    Note to Pharmacy:  Mardelle Matte   : cabinet override      03/30/18 1416 03/30/18 2038   03/30/18 1200  clindamycin  (CLEOCIN) IVPB 900 mg  Status:  Discontinued     900 mg 100 mL/hr over 30 Minutes Intravenous On call to O.R. 03/30/18 1150 03/30/18 1706       Patient was given sequential compression devices, early ambulation, and chemoprophylaxis to prevent DVT.  Patient benefited maximally from hospital stay and there were no complications.    Recent vital signs:  Patient Vitals for the past 24 hrs:  BP Temp Temp src Pulse Resp SpO2  03/31/18 1314 103/69 98.1 F (36.7 C) Oral 75 18 99 %  03/31/18 1024 122/64 - - 72 - -  03/31/18 0944 (!) 100/57 98.7 F (37.1 C) - 69 17 97 %  03/31/18 0454 111/76 98.4 F (36.9 C) Oral 67 16 99 %  03/31/18 0053 113/71 98.5 F (36.9 C) Oral 72 18 99 %  03/30/18 2223 128/75 98 F (36.7 C) Oral 80 16 100 %     Recent laboratory studies:  Recent Labs    03/31/18 0341  WBC 9.5  HGB 10.3*  HCT 30.4*  PLT 196  NA 132*  K 3.3*  CL 96*  CO2 26  BUN 8  CREATININE 0.77  GLUCOSE 98  CALCIUM 8.0*     Discharge Medications:   Allergies as of 03/31/2018      Reactions   Aspirin Shortness Of Breath, Other (See Comments)   congestion   Codeine Itching,  Other (See Comments)   Facial itching    Percocet [oxycodone-acetaminophen] Itching, Other (See Comments)   Facial itching    Phenergan [promethazine Hcl] Itching, Other (See Comments)   Facial itching    Tape Other (See Comments)   Surgical tape caused blisters      Medication List    STOP taking these medications   HYDROcodone-acetaminophen 7.5-325 MG tablet Commonly known as:  NORCO   meloxicam 15 MG tablet Commonly known as:  MOBIC     TAKE these medications   amLODipine 5 MG tablet Commonly known as:  NORVASC Take 5 mg by mouth daily.   enoxaparin 40 MG/0.4ML injection Commonly known as:  LOVENOX Inject 0.4 mLs (40 mg total) into the skin daily for 14 days.   estradiol 2 MG tablet Commonly known as:  ESTRACE Take 2 mg by mouth at bedtime.   gabapentin 300 MG capsule Commonly  known as:  NEURONTIN Take 600 mg by mouth at bedtime.   HAIR/SKIN/NAILS PO Take 2 tablets by mouth every evening.   JUICE PLUS FIBRE PO Take 4 tablets by mouth 2 (two) times daily. Take 2 tablets of Fruit Blend + 2 tablets of  Vegetable Blend twice daily   methocarbamol 500 MG tablet Commonly known as:  ROBAXIN Take 1 tablet (500 mg total) by mouth every 6 (six) hours as needed for muscle spasms.   montelukast 10 MG tablet Commonly known as:  SINGULAIR Take 10 mg by mouth at bedtime.   omeprazole 20 MG tablet Commonly known as:  PRILOSEC OTC Take 20 mg by mouth daily as needed (acid reflux).   ondansetron 4 MG tablet Commonly known as:  ZOFRAN Take 1 tablet (4 mg total) by mouth every 6 (six) hours as needed for nausea.   OVER THE COUNTER MEDICATION Take 100 mg by mouth daily. CBD Tablet -100 mg tablet   OVER THE COUNTER MEDICATION Take 3 tablets by mouth 2 (two) times daily. Fiber Well Gummies   oxyCODONE 5 MG immediate release tablet Commonly known as:  Oxy IR/ROXICODONE Take 1-2 tablets (5-10 mg total) by mouth every 4 (four) hours as needed for moderate pain (pain score 4-6).   rivaroxaban 10 MG Tabs tablet Commonly known as:  XARELTO Take 1 tablet (10 mg total) by mouth daily with breakfast. Start taking on:  April 01, 2018   rizatriptan 5 MG tablet Commonly known as:  MAXALT Take 5 mg by mouth every 2 (two) hours as needed for migraine.   tiZANidine 4 MG tablet Commonly known as:  ZANAFLEX Take 1-2 tablets (4-8 mg total) by mouth every 6 (six) hours as needed for muscle spasms.   valsartan-hydrochlorothiazide 80-12.5 MG tablet Commonly known as:  DIOVAN-HCT Take 1 tablet by mouth daily.            Durable Medical Equipment  (From admission, onward)         Start     Ordered   03/30/18 1718  DME 3 n 1  Once     03/30/18 1717   03/30/18 1718  DME Walker rolling  Once    Question:  Patient needs a walker to treat with the following condition   Answer:  Status post total replacement of left hip   03/30/18 1717          Diagnostic Studies: Dg Pelvis Portable  Result Date: 03/30/2018 CLINICAL DATA:  Left hip replacement EXAM: PORTABLE PELVIS 1-2 VIEWS COMPARISON:  01/08/2018 FINDINGS: Interval left hip replacement in satisfactory  position alignment. No acute complication Previously noted right hip replacement unchanged. IMPRESSION: Satisfactory left hip replacement. Electronically Signed   By: Franchot Gallo M.D.   On: 03/30/2018 17:10   Dg C-arm 1-60 Min-no Report  Result Date: 03/30/2018 Fluoroscopy was utilized by the requesting physician.  No radiographic interpretation.   Dg Hip Operative Unilat W Or W/o Pelvis Left  Result Date: 03/30/2018 CLINICAL DATA:  Left hip replacement. FLUOROSCOPY TIME:  32 seconds. Images: 2 EXAM: OPERATIVE LEFT HIP (WITH PELVIS IF PERFORMED) to VIEWS TECHNIQUE: Fluoroscopic spot image(s) were submitted for interpretation post-operatively. COMPARISON:  None. FINDINGS: The patient is status post left hip replacement by the end of the study. Hardware is in good position. IMPRESSION: Left hip replacement as above. Electronically Signed   By: Dorise Bullion III M.D   On: 03/30/2018 15:53    Disposition: Discharge disposition: 01-Home or [place]    [name], MD Follow up in 2 week(s).   Specialty:  Orthopedic Surgery Contact information: [place] [address] [phone]            Signed: [name] 03/31/2018, 9:30 PM

## 2018-03-31 NOTE — Progress Notes (Signed)
Subjective: 1 Day Post-Op Procedure(s) (LRB): LEFT TOTAL HIP ARTHROPLASTY ANTERIOR APPROACH (Left) Patient reports pain as moderate.    Objective: Vital signs in last 24 hours: Temp:  [97.4 F (36.3 C)-98.5 F (36.9 C)] 98.4 F (36.9 C) (01/18 0454) Pulse Rate:  [60-80] 67 (01/18 0454) Resp:  [16-19] 16 (01/18 0454) BP: (102-128)/(54-82) 111/76 (01/18 0454) SpO2:  [98 %-100 %] 99 % (01/18 0454) Weight:  [83.1 kg] 83.1 kg (01/17 1700)  Intake/Output from previous day: 01/17 0701 - 01/18 0700 In: 2214 [P.O.:120; I.V.:1944; IV Piggyback:150] Out: 1300 [Urine:1000; Blood:300] Intake/Output this shift: No intake/output data recorded.  Recent Labs    03/28/18 1116 03/31/18 0341  HGB 12.4 10.3*   Recent Labs    03/28/18 1116 03/31/18 0341  WBC 6.8 9.5  RBC 4.08 3.35*  HCT 36.9 30.4*  PLT 253 196   Recent Labs    03/28/18 1116 03/31/18 0341  NA 137 132*  K 4.3 3.3*  CL 103 96*  CO2 23 26  BUN 15 8  CREATININE 0.80 0.77  GLUCOSE 96 98  CALCIUM 9.1 8.0*   No results for input(s): LABPT, INR in the last 72 hours.  Sensation intact distally Intact pulses distally Dorsiflexion/Plantar flexion intact Incision: scant drainage  Assessment/Plan: 1 Day Post-Op Procedure(s) (LRB): LEFT TOTAL HIP ARTHROPLASTY ANTERIOR APPROACH (Left) Up with therapy Plan for discharge tomorrow Discharge home with home health    [name] 03/31/2018, 7:40 AM

## 2018-03-31 NOTE — Evaluation (Signed)
Physical Therapy Evaluation Patient Details Name: Paula Moses MRN: [id number] DOB: 02-Sep-1965 Today's Date: 03/31/2018   History of Present Illness  Pt s/p L THR and with hx of R THR last year  Clinical Impression  Pt s/p L THR and presents with decreased L LE strength/ROM and post op pain limiting functional mobility.  Pt should progress to dc home with family assist.    Follow Up Recommendations Follow surgeon's recommendation for DC plan and follow-up therapies    Equipment Recommendations  None recommended by PT    Recommendations for Other Services       Precautions / Restrictions Precautions Precautions: Fall Restrictions Weight Bearing Restrictions: No      Mobility  Bed Mobility Overal bed mobility: Needs Assistance Bed Mobility: Supine to Sit     Supine to sit: Min guard     General bed mobility comments: min cues for sequence  Transfers Overall transfer level: Needs assistance Equipment used: Rolling walker (2 wheeled) Transfers: Sit to/from Stand Sit to Stand: Min guard         General transfer comment: cues for LE management and use of UEs to self assist  Ambulation/Gait Ambulation/Gait assistance: Min assist;Min guard Gait Distance (Feet): 115 Feet(and 20' into bathroom)   Gait Pattern/deviations: Step-to pattern;Step-through pattern;Decreased step length - right;Decreased step length - left;Shuffle;Trunk flexed Gait velocity: decr   General Gait Details: min cues for posture and position from [employer] Mobility    Modified Rankin (Stroke Patients Only)       Balance Overall balance assessment: Mild deficits observed, not formally tested                                           Pertinent Vitals/Pain Pain Assessment: 0-10 Pain Score: 4  Pain Location: L hip Pain Descriptors / Indicators: Aching;Sore Pain Intervention(s): Limited activity within patient's tolerance;Monitored during  session;Premedicated before session;Ice applied    Home Living Family/patient expects to be discharged to:: Private residence Living Arrangements: Spouse/significant other Available Help at Discharge: Family Type of Home: House Home Access: Level entry     Home Layout: One level Home Equipment: Bedside commode;Walker - standard Additional Comments: husband states he is going to put in hand held shower head, and something for her to sit on (states plenty of room in the shower for a chair)    Prior Function Level of Independence: Independent               Hand Dominance   Dominant Hand: Right    Extremity/Trunk Assessment   Upper Extremity Assessment Upper Extremity Assessment: Overall WFL for tasks assessed    Lower Extremity Assessment Lower Extremity Assessment: LLE deficits/detail    Cervical / Trunk Assessment Cervical / Trunk Assessment: Normal  Communication   Communication: No difficulties  Cognition Arousal/Alertness: Awake/alert Behavior During Therapy: WFL for tasks assessed/performed Overall Cognitive Status: Within Functional Limits for tasks assessed                                        General Comments      Exercises Total Joint Exercises Ankle Circles/Pumps: AROM;Both;20 reps;Supine   Assessment/Plan    PT Assessment Patient needs continued PT services  PT Problem List Decreased strength;Decreased range of motion;Decreased activity tolerance;Decreased mobility;Decreased knowledge of use of DME;Pain       PT Treatment Interventions DME instruction;Gait training;Stair training;Functional mobility training;Therapeutic activities;Therapeutic exercise;Patient/family education    PT Goals (Current goals can be found in the Care Plan section)  Acute Rehab PT Goals Patient Stated Goal: Regain IND PT Goal Formulation: With patient Time For Goal Achievement: 04/07/18 Potential to Achieve Goals: Good    Frequency 7X/week    Barriers to discharge        Co-evaluation               AM-PAC PT "6 Clicks" Mobility  Outcome Measure Help needed turning from your back to your side while in a flat bed without using bedrails?: A Little Help needed moving from lying on your back to sitting on the side of a flat bed without using bedrails?: A Little Help needed moving to and from a bed to a chair (including a wheelchair)?: A Little Help needed standing up from a chair using your arms (e.g., wheelchair or bedside chair)?: A Little Help needed to walk in hospital room?: A Little Help needed climbing 3-5 steps with a railing? : A Little 6 Click Score: 18    End of Session Equipment Utilized During Treatment: Gait belt Activity Tolerance: Patient tolerated treatment well Patient left: in chair;with call bell/phone within reach;with family/visitor present Nurse Communication: Mobility status PT Visit Diagnosis: Difficulty in walking, not elsewhere classified (R26.2)    Time: 7096-4383 PT Time Calculation (min) (ACUTE ONLY): 26 min   Charges:   PT Evaluation $PT Eval Low Complexity: 1 Low PT Treatments $Gait Training: 8-22 mins        [place] Pager [phone] Office [phone]   Shaniquia Brafford 03/31/2018, 12:38 PM

## 2018-03-31 NOTE — Progress Notes (Signed)
Physical Therapy Treatment Patient Details Name: Paula Moses MRN: [id number] DOB: March 06, 1966 Today's Date: 03/31/2018    History of Present Illness Pt s/p L THR and with hx of R THR last year    PT Comments    Pt very motivated and progressing well with mobility despite c/o muscle spasms.  Pt hopeful for dc home this date - RN aware.   Follow Up Recommendations  Follow surgeon's recommendation for DC plan and follow-up therapies     Equipment Recommendations  None recommended by PT    Recommendations for Other Services       Precautions / Restrictions Precautions Precautions: Fall Restrictions Weight Bearing Restrictions: No    Mobility  Bed Mobility               General bed mobility comments: Nt - pt up in chair and requests back to same  Transfers Overall transfer level: Needs assistance Equipment used: Rolling walker (2 wheeled) Transfers: Sit to/from Stand Sit to Stand: Min guard;Supervision         General transfer comment: min cues for LE management and use of UEs to self assist  Ambulation/Gait Ambulation/Gait assistance: Min guard;Supervision Gait Distance (Feet): 200 Feet Assistive device: Rolling walker (2 wheeled) Gait Pattern/deviations: Step-to pattern;Step-through pattern;Decreased step length - right;Decreased step length - left;Shuffle;Trunk flexed Gait velocity: decr   General Gait Details: min cues for posture and position from RW; several rest breaks required 2* spasms   Stairs             Wheelchair Mobility    Modified Rankin (Stroke Patients Only)       Balance Overall balance assessment: Mild deficits observed, not formally tested                                          Cognition Arousal/Alertness: Awake/alert Behavior During Therapy: WFL for tasks assessed/performed Overall Cognitive Status: Within Functional Limits for tasks assessed                                         Exercises Total Joint Exercises Ankle Circles/Pumps: AROM;Both;20 reps;Supine    General Comments        Pertinent Vitals/Pain Pain Assessment: 0-10 Pain Score: 6  Pain Location: L hip Pain Descriptors / Indicators: Aching;Sore;Spasm Pain Intervention(s): Limited activity within patient's tolerance;Monitored during session;Premedicated before session;Patient requesting pain meds-RN notified;Ice applied    Home Living                      Prior Function            PT Goals (current goals can now be found in the care plan section) Acute Rehab PT Goals Patient Stated Goal: Regain IND PT Goal Formulation: With patient Time For Goal Achievement: 04/07/18 Potential to Achieve Goals: Good Progress towards PT goals: Progressing toward goals    Frequency    7X/week      PT Plan Current plan remains appropriate    Co-evaluation              AM-PAC PT "6 Clicks" Mobility   Outcome Measure  Help needed turning from your back to your side while in a flat bed without using bedrails?: A Little Help needed moving from lying on your  back to sitting on the side of a flat bed without using bedrails?: A Little Help needed moving to and from a bed to a chair (including a wheelchair)?: A Little Help needed standing up from a chair using your arms (e.g., wheelchair or bedside chair)?: A Little Help needed to walk in hospital room?: A Little Help needed climbing 3-5 steps with a railing? : A Little 6 Click Score: 18    End of Session Equipment Utilized During Treatment: Gait belt Activity Tolerance: Patient tolerated treatment well Patient left: in chair;with call bell/phone within reach;with family/visitor present Nurse Communication: Mobility status PT Visit Diagnosis: Difficulty in walking, not elsewhere classified (R26.2)     Time: 0350-0938 PT Time Calculation (min) (ACUTE ONLY): 18 min  Charges:  $Gait Training: 8-22 mins                      [place] Pager [phone] Office [phone]    Angellica Maddison 03/31/2018, 5:00 PM

## 2018-03-31 NOTE — Progress Notes (Signed)
OT Cancellation Note  Patient Details Name: Paula Moses MRN: [id number] DOB: 06/18/1965   Cancelled Treatment:    Reason Eval/Treat Not Completed: PT screened, no needs identified, will sign off  Tyah Acord 03/31/2018, 11:11 AM  Lesle Chris, OTR/L Acute Rehabilitation Services [phone] [facility] pager [phone] office 03/31/2018

## 2018-03-31 NOTE — Progress Notes (Signed)
Pt stable at time of d/c. No needs at time of d/c though pt is sleepy overall though arousable. No questions on discharge instructions and education given.

## 2018-04-02 ENCOUNTER — Telehealth (INDEPENDENT_AMBULATORY_CARE_PROVIDER_SITE_OTHER): Payer: Self-pay | Admitting: Orthopaedic Surgery

## 2018-04-02 ENCOUNTER — Encounter (HOSPITAL_COMMUNITY): Payer: Self-pay | Admitting: Orthopaedic Surgery

## 2018-04-02 NOTE — Telephone Encounter (Signed)
Patient spouse [name] called wanted to speak to Caryl Pina in regard to  [facility] care ,etc.  Please give him a call @[phone]

## 2018-04-02 NOTE — Telephone Encounter (Signed)
Spoke with patient husband, working on [place] for him

## 2018-04-03 MED ORDER — OXYCODONE HCL 5 MG PO TABS: 5.0000 mg | ORAL_TABLET | ORAL | 0 refills | Status: DC | PRN

## 2018-04-03 NOTE — Telephone Encounter (Signed)
Patient asking that we send her pain Rx to [facility] not [facility], can we change? And I can cancel the other  Also blood thinner was $200, can she just take aspirin?

## 2018-04-03 NOTE — Telephone Encounter (Signed)
She says yes she is about out. She states she is really hurting and taking 2 every 4 hours. She states just taking 1 isn't helpful.

## 2018-04-03 NOTE — Telephone Encounter (Signed)
She needs to take a 81 mg aspirin twice daily.  I did send in some oxycodone to [facility].  We [facility] Saturday evening since she was leaving the hospital late.  Has she already been thru the oxycodone I sent in there?

## 2018-04-04 ENCOUNTER — Telehealth (INDEPENDENT_AMBULATORY_CARE_PROVIDER_SITE_OTHER): Payer: Self-pay | Admitting: Orthopaedic Surgery

## 2018-04-04 NOTE — Telephone Encounter (Signed)
Pt mother called concerning daughter medication that was sent in to [facility]. The RX couldn't be filled due to the pharm having questions. PT advised the pharm called but never received a call back.

## 2018-04-04 NOTE — Telephone Encounter (Signed)
Patient aware they wouldn't fill Rx unless they put "max 6 a day", so we told them this was ok and patient aware

## 2018-04-04 NOTE — Telephone Encounter (Signed)
Patient left a message stating that PT still has not been set up for her.  Please call patient.  CB#[phone].  Thank you.

## 2018-04-05 NOTE — Telephone Encounter (Signed)
Patient aware that her insurance won't cover HHPT, but we can start outpatient therapy whenever she is ready if she decides to start before her 1st post op appt

## 2018-04-12 ENCOUNTER — Ambulatory Visit (INDEPENDENT_AMBULATORY_CARE_PROVIDER_SITE_OTHER): Payer: PRIVATE HEALTH INSURANCE | Admitting: Orthopaedic Surgery

## 2018-04-12 ENCOUNTER — Encounter (INDEPENDENT_AMBULATORY_CARE_PROVIDER_SITE_OTHER): Payer: Self-pay | Admitting: Orthopaedic Surgery

## 2018-04-12 DIAGNOSIS — Z96642 Presence of left artificial hip joint: Secondary | ICD-10-CM

## 2018-04-12 MED ORDER — OXYCODONE HCL 5 MG PO TABS: 5.0000 mg | ORAL_TABLET | ORAL | 0 refills | Status: DC | PRN

## 2018-04-12 NOTE — Progress Notes (Signed)
The patient is a very pleasant 53 year old female who is here 13 days status post a left total hip arthroplasty through direct anterior approach.  She actually had a right hip replaced and [facility] there were anterior approach in August 2019.  She is been working through home therapy on her mobility and gait training.  She is on a cane now.  She says range of motion and strength are improving.  She does need a refill on her oxycodone.  She has been on aspirin twice a day as well.  On exam her incision looks good.  Remove the old Steri-Strips in place new wounds.  There is a small area on the top of her incision that needs some ointment placed daily with Bactroban after she showers.  I showed her how to do this.  There is no significant seroma at all.  Her leg lengths appear equal.  Should continue increase her activities as comfort allows.  All question concerns were answered and addressed.  I would like to see her back in follow-up in 4 weeks just to see how she is doing overall with her mobility but no x-rays are needed.

## 2018-04-16 ENCOUNTER — Telehealth (INDEPENDENT_AMBULATORY_CARE_PROVIDER_SITE_OTHER): Payer: Self-pay | Admitting: Orthopaedic Surgery

## 2018-04-16 MED ORDER — GABAPENTIN 300 MG PO CAPS: 600.0000 mg | ORAL_CAPSULE | Freq: Every day | ORAL | 3 refills | Status: DC

## 2018-04-16 MED ORDER — MELOXICAM 15 MG PO TABS: 15.0000 mg | ORAL_TABLET | Freq: Every day | ORAL | 6 refills | Status: DC | PRN

## 2018-04-16 NOTE — Telephone Encounter (Signed)
Please advise 

## 2018-04-16 NOTE — Telephone Encounter (Signed)
Patient called to see if Dr. Ninfa Linden would be willing to fill the patient's Gabapentin and Meloxicam.  She uses [facility] on [address].  CB#[phone].  Thank you.

## 2018-05-07 ENCOUNTER — Telehealth (INDEPENDENT_AMBULATORY_CARE_PROVIDER_SITE_OTHER): Payer: Self-pay | Admitting: Orthopaedic Surgery

## 2018-05-07 MED ORDER — GABAPENTIN 300 MG PO CAPS: 600.0000 mg | ORAL_CAPSULE | Freq: Three times a day (TID) | ORAL | 3 refills | Status: DC

## 2018-05-07 NOTE — Telephone Encounter (Signed)
Pt called asking if she can have a refill on her  Gabapentin.  Pt states she needs to take it 3x a day.

## 2018-05-07 NOTE — Telephone Encounter (Signed)
Please advise 

## 2018-05-10 ENCOUNTER — Encounter (INDEPENDENT_AMBULATORY_CARE_PROVIDER_SITE_OTHER): Payer: Self-pay | Admitting: Orthopaedic Surgery

## 2018-05-10 ENCOUNTER — Ambulatory Visit (INDEPENDENT_AMBULATORY_CARE_PROVIDER_SITE_OTHER): Payer: PRIVATE HEALTH INSURANCE | Admitting: Orthopaedic Surgery

## 2018-05-10 DIAGNOSIS — Z96642 Presence of left artificial hip joint: Secondary | ICD-10-CM

## 2018-05-10 NOTE — Progress Notes (Signed)
Paula Moses is now about 6 weeks status post a left direct anterior total hip arthroplasty.  She had an anterior hip replacement of her right hip about 5 months prior to that.  That hip was done in [place] and we did her left hip in [place].  She is doing well overall.  She does complain of some pain in the right hip.  She is also dealt with issues of spinal stenosis and sciatica.  She is walking without assistive device and actually looks pretty good when she walks in general.  On exam her leg lengths are equal.  I can move both hips smoothly through full internal X rotation without difficulty at all.  She is to continue increase her activities as comfort allows.  I talked her about exercises he can participate in and others to avoid such as squats and deep lunges.  I would like to see her back in 4 weeks to see how she is doing overall but no x-rays are needed.  If she looks good at that visit we will see her back about 6 months after that for repeat x-rays.  All question concerns were answered and addressed.

## 2018-05-17 ENCOUNTER — Telehealth (INDEPENDENT_AMBULATORY_CARE_PROVIDER_SITE_OTHER): Payer: Self-pay | Admitting: Orthopaedic Surgery

## 2018-05-17 NOTE — Telephone Encounter (Signed)
The symptoms she is having may just be scar tissue.  Really nothing to do about it right now.  However, I can x-ray it again when we see her at her next follow-up.  I don't mind checking it out.

## 2018-05-17 NOTE — Telephone Encounter (Signed)
Patient aware of the below message  

## 2018-05-17 NOTE — Telephone Encounter (Signed)
Patient stated that she had a hip replacement on the right side with another doctor, she is not able to go back and see that doctor because he was upset she used another doctor, but she is experiencing some crunching noises and is a little painful.  She wants to know what to do since the other doctor will not see her again.  Dr. Ninfa Linden replaced her left hip.  CB#[phone].  Thank you.

## 2018-05-28 IMAGING — US US EXTREM LOW VENOUS*R*
1 series · 13 of 24 positions shown · non-contrast
Comparison: None.

CLINICAL DATA: Right lower extremity swelling



[Series 1: us extrem low venous*right* · 0.08mm/px · 13 of 33 slices shown]
[im 1/33]
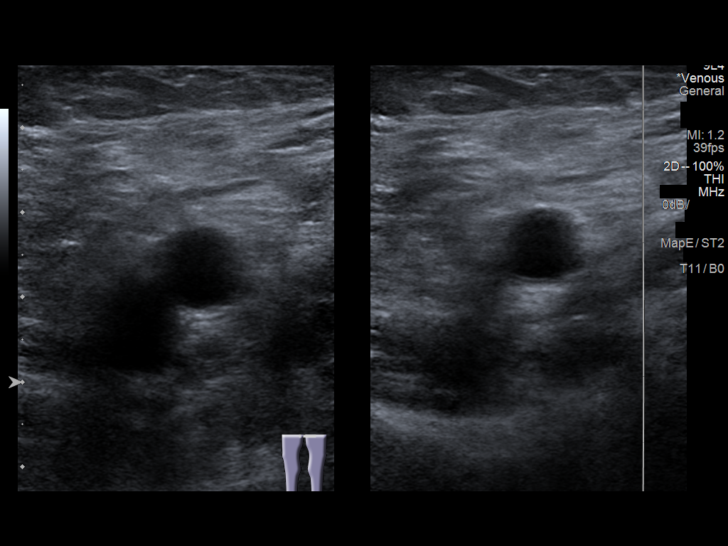
[im 3/33]
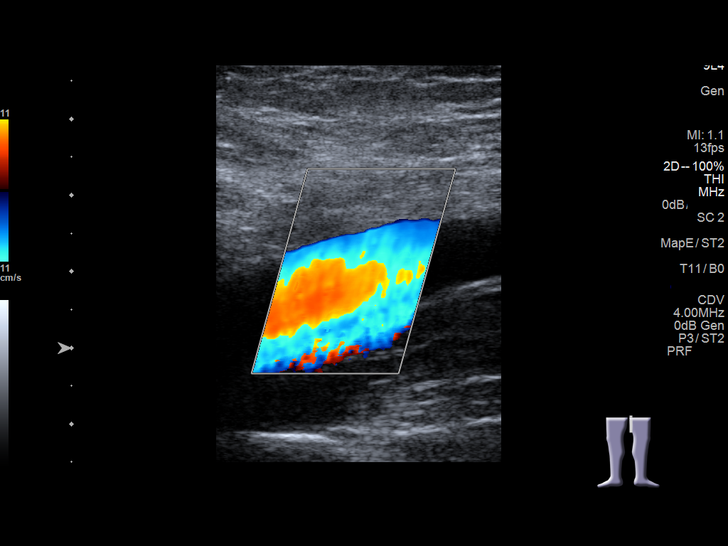
[im 6/33]
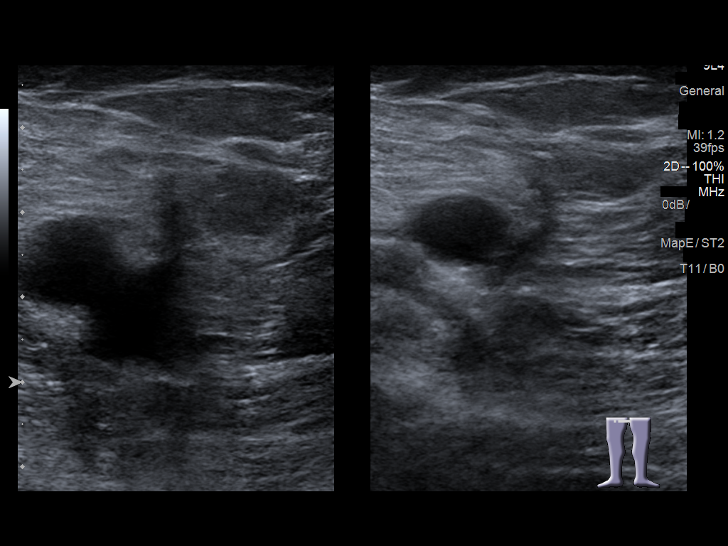
[im 9/33]
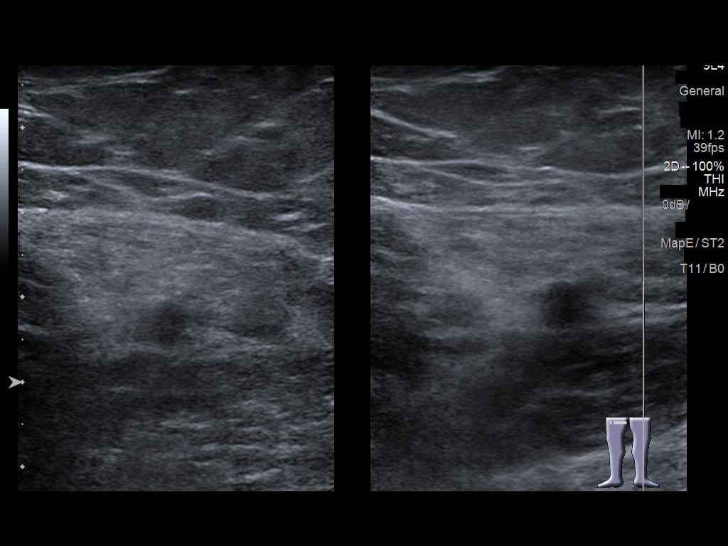
[im 12/33]
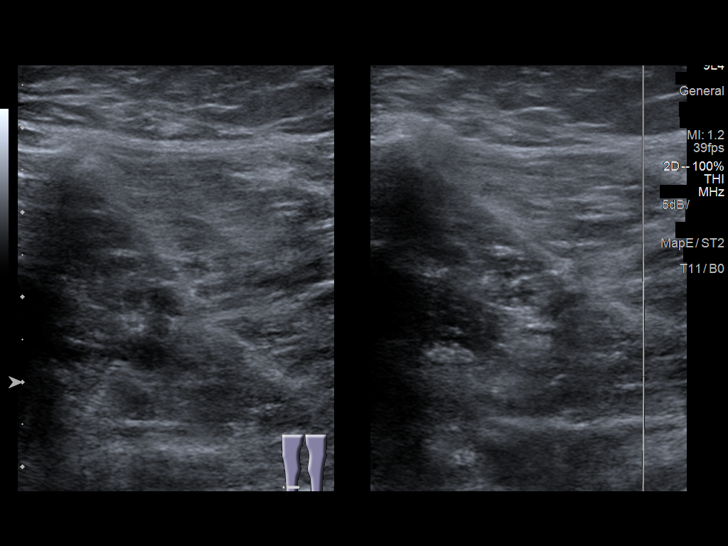
[im 14/33]
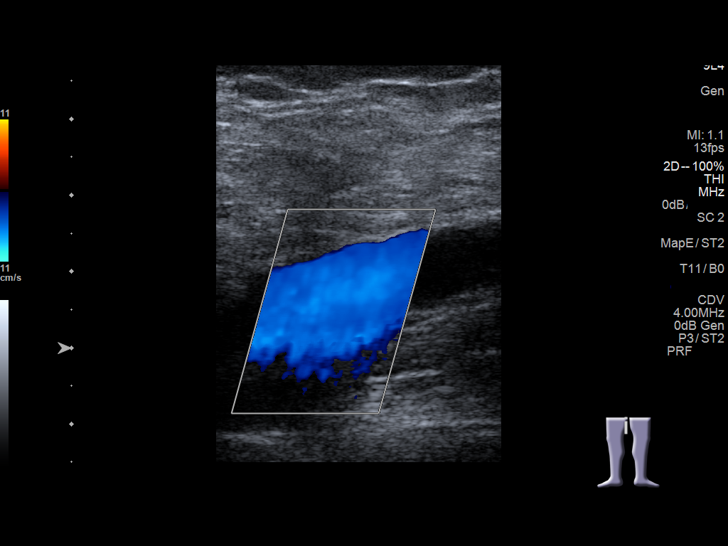
[im 17/33]
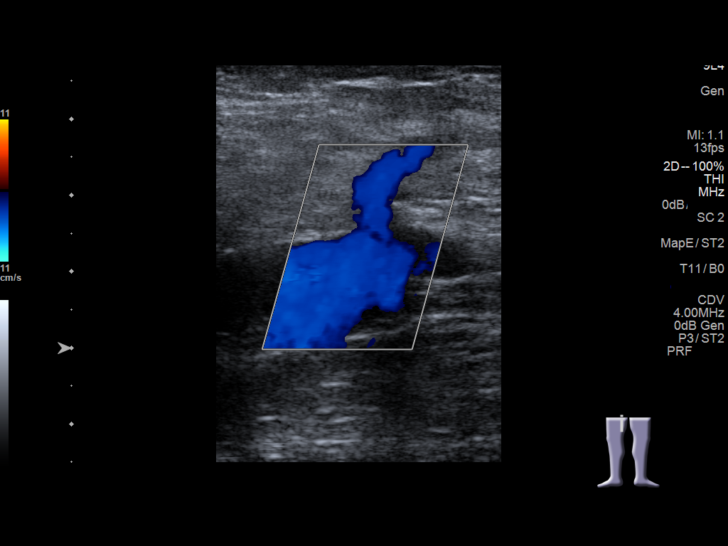
[im 19/33]
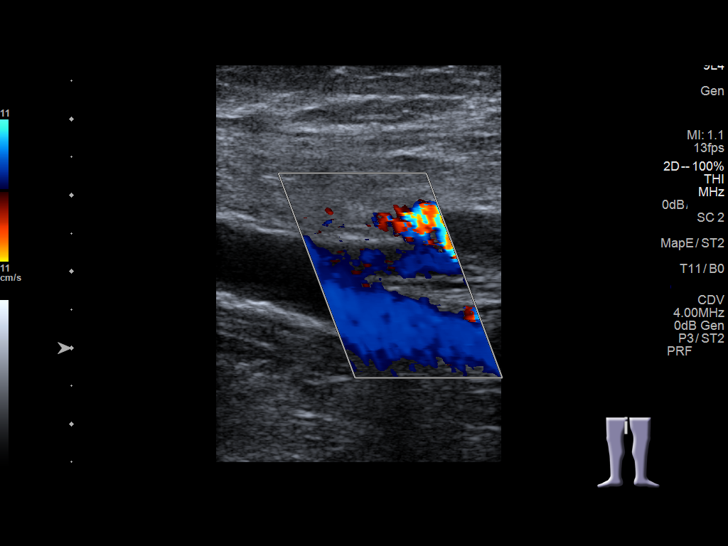
[im 21/33]
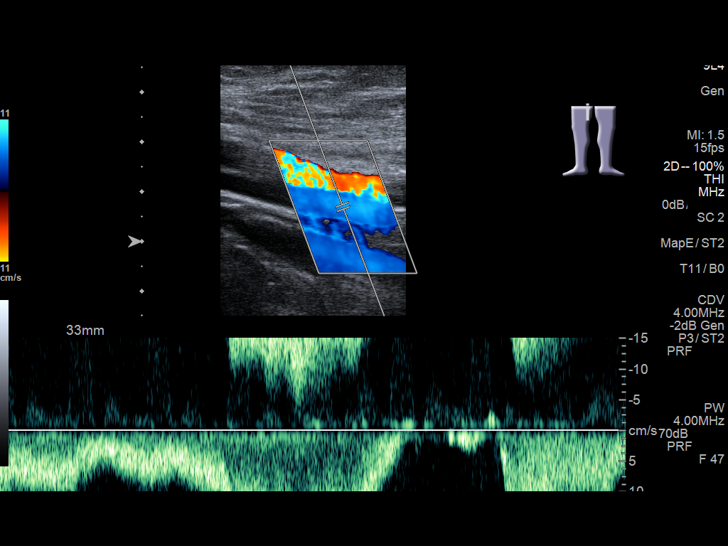
[im 24/33]
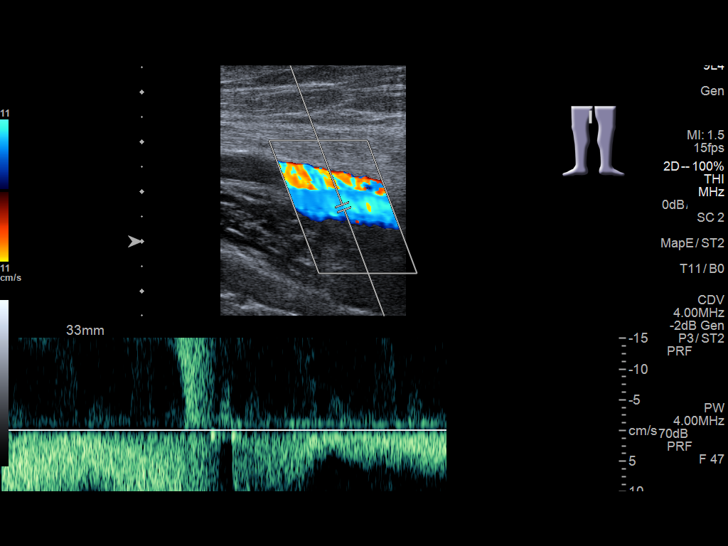
[im 27/33]
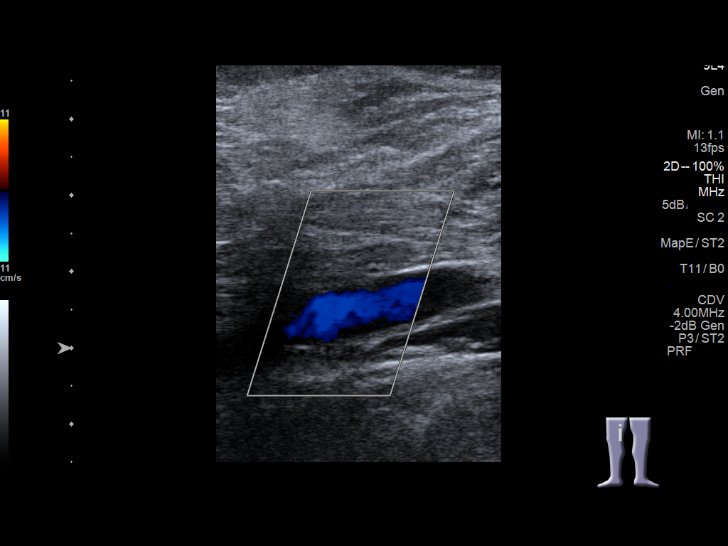
[im 30/33]
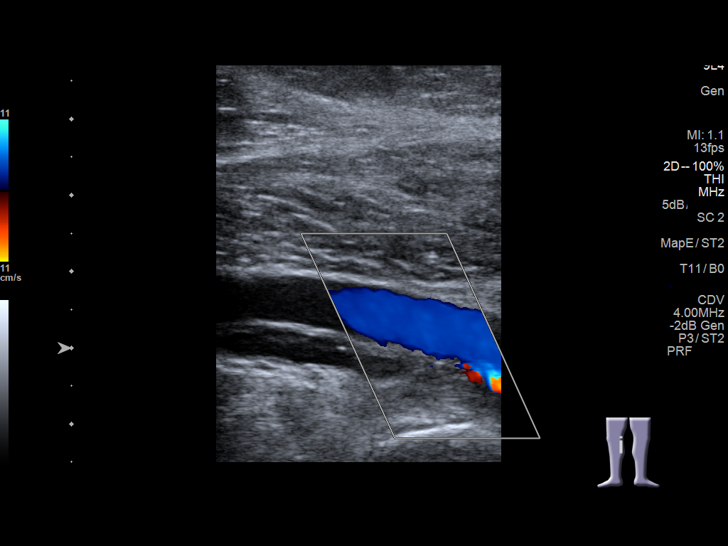
[im 33/33]
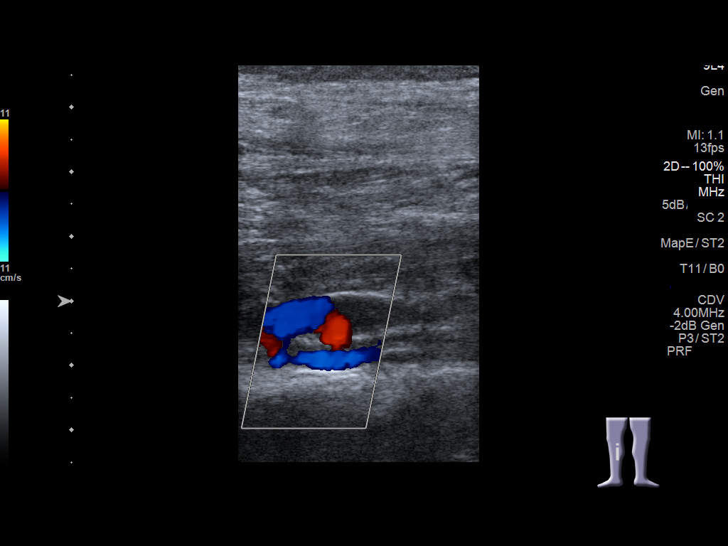

[13 of 24 positions shown; findings below may reference images not displayed]

FINDINGS: Contralateral Common Femoral Vein: Respiratory phasicity is normal
and symmetric with the symptomatic side. No evidence of thrombus.
Normal compressibility.

Common Femoral Vein: No evidence of thrombus. Normal
compressibility, respiratory phasicity and response to augmentation.

Saphenofemoral Junction: No evidence of thrombus. Normal
compressibility and flow on color Doppler imaging.

Profunda Femoral Vein: No evidence of thrombus. Normal
compressibility and flow on color Doppler imaging.

Femoral Vein: No evidence of thrombus. Normal compressibility,
respiratory phasicity and response to augmentation.

Popliteal Vein: No evidence of thrombus. Normal compressibility,
respiratory phasicity and response to augmentation.

Calf Veins: No evidence of thrombus. Normal compressibility and flow
on color Doppler imaging.

Superficial Great Saphenous Vein: No evidence of thrombus. Normal
compressibility.

Venous Reflux:  None.

Other Findings:  None.
IMPRESSION: No evidence of deep venous thrombosis in the right lower extremity.

## 2018-06-05 ENCOUNTER — Telehealth (INDEPENDENT_AMBULATORY_CARE_PROVIDER_SITE_OTHER): Payer: Self-pay | Admitting: Radiology

## 2018-06-05 NOTE — Telephone Encounter (Signed)
Pt moved and NO to COVID-19 questions

## 2018-06-07 ENCOUNTER — Ambulatory Visit (INDEPENDENT_AMBULATORY_CARE_PROVIDER_SITE_OTHER): Payer: PRIVATE HEALTH INSURANCE | Admitting: Orthopaedic Surgery

## 2018-06-08 ENCOUNTER — Telehealth (INDEPENDENT_AMBULATORY_CARE_PROVIDER_SITE_OTHER): Payer: Self-pay

## 2018-06-08 NOTE — Telephone Encounter (Signed)
Called pt and she answered NO to all COVID-19 questions. Pt has an appt with CB on Monday.

## 2018-06-11 ENCOUNTER — Ambulatory Visit (INDEPENDENT_AMBULATORY_CARE_PROVIDER_SITE_OTHER): Payer: PRIVATE HEALTH INSURANCE | Admitting: Orthopaedic Surgery

## 2018-06-11 ENCOUNTER — Other Ambulatory Visit: Payer: Self-pay

## 2018-06-11 ENCOUNTER — Encounter (INDEPENDENT_AMBULATORY_CARE_PROVIDER_SITE_OTHER): Payer: Self-pay | Admitting: Orthopaedic Surgery

## 2018-06-11 DIAGNOSIS — Z96642 Presence of left artificial hip joint: Secondary | ICD-10-CM

## 2018-06-11 NOTE — Progress Notes (Signed)
HPI: Paula Moses is now 44 days status post left total hip arthroplasty.  She is also status post right total hip arthroplasty done by physician elsewhere in August 2019.  She states her left hip overall is sore.  She is having more symptoms involving the right hip states it feels like it testing with time.  She does not describe it coming out.  Is only with certain movements.  She does note that it is difficult for her to get in and out of a low car.  She is wondering if she can go to physical therapy to see if this helps with her overall range of motion, soreness and ability to transfer easily.  Physical exam: Bilateral hips good range of motion.  Extremes of internal rotation causes mild discomfort both hips.  She ambulates without any assistive device and with a nonantalgic gait.   Impression: Status post left total hip arthroplasty 03/29/2018 Status post right total hip arthroplasty 10/26/2017  Plan: We will send her to physical therapy for range of motion and strengthening transfers.  See her back in 3 months at that time we will pain AP pelvis and lateral views of both hips.  Questions encouraged and answered at length.

## 2018-07-02 ENCOUNTER — Ambulatory Visit (INDEPENDENT_AMBULATORY_CARE_PROVIDER_SITE_OTHER): Payer: PRIVATE HEALTH INSURANCE | Admitting: Orthopaedic Surgery

## 2018-09-07 ENCOUNTER — Telehealth: Payer: Self-pay | Admitting: Orthopaedic Surgery

## 2018-09-07 NOTE — Telephone Encounter (Signed)
I called patient and advised. 

## 2018-09-07 NOTE — Telephone Encounter (Signed)
Patient gong out of town and wants to know if she can go horseback riding. LMOM Blackman not in office. Patient wanted to CX appt and wanted to know the answer to her question possibly before she left town.

## 2018-09-07 NOTE — Telephone Encounter (Signed)
It is ok to go horse riding

## 2018-09-07 NOTE — Telephone Encounter (Signed)
Please advise 

## 2018-09-11 ENCOUNTER — Ambulatory Visit: Payer: Self-pay | Admitting: Orthopaedic Surgery

## 2018-10-22 ENCOUNTER — Other Ambulatory Visit: Payer: Self-pay

## 2018-10-22 MED ORDER — MELOXICAM 15 MG PO TABS: 15.0000 mg | ORAL_TABLET | Freq: Every day | ORAL | 6 refills | Status: DC | PRN

## 2018-12-05 ENCOUNTER — Telehealth: Payer: Self-pay

## 2018-12-05 NOTE — Telephone Encounter (Signed)
Copied from [place] [id number]. Topic: Appointment Scheduling - Scheduling Inquiry for Clinic >> Dec 05, 2018 11:24 AM Yvette Rack wrote: Reason for CRM: Patient mother, Debara Pickett, stated she is a pt of Dr. Derrel Nip and requests that Dr. Derrel Nip agrees to accept her daughter as a new patient. Cb# [phone]

## 2018-12-05 NOTE — Telephone Encounter (Signed)
Are you willing to accept as a new patient?

## 2018-12-05 NOTE — Telephone Encounter (Signed)
Copied from [place] [id number]. Topic: Appointment Scheduling - New Patient >> Dec 05, 2018 11:00 AM Lennox Solders wrote: New patient has been scheduled for your office. Provider: lauren Date of Appointment: 12-12-2018 Route to department's PEC pool.

## 2018-12-05 NOTE — Telephone Encounter (Signed)
yes

## 2018-12-06 NOTE — Telephone Encounter (Signed)
Can you schedule for a new pt appt.

## 2018-12-12 ENCOUNTER — Encounter: Payer: Self-pay | Admitting: Internal Medicine

## 2018-12-12 ENCOUNTER — Ambulatory Visit (INDEPENDENT_AMBULATORY_CARE_PROVIDER_SITE_OTHER): Payer: PRIVATE HEALTH INSURANCE | Admitting: Internal Medicine

## 2018-12-12 ENCOUNTER — Ambulatory Visit: Payer: PRIVATE HEALTH INSURANCE | Admitting: Family Medicine

## 2018-12-12 ENCOUNTER — Other Ambulatory Visit: Payer: Self-pay

## 2018-12-12 VITALS — Ht 64.0 in | Wt 183.0 lb

## 2018-12-12 DIAGNOSIS — Z1211 Encounter for screening for malignant neoplasm of colon: Secondary | ICD-10-CM | POA: Diagnosis not present

## 2018-12-12 DIAGNOSIS — K5900 Constipation, unspecified: Secondary | ICD-10-CM | POA: Insufficient documentation

## 2018-12-12 DIAGNOSIS — K5904 Chronic idiopathic constipation: Secondary | ICD-10-CM

## 2018-12-12 DIAGNOSIS — I10 Essential (primary) hypertension: Secondary | ICD-10-CM

## 2018-12-12 NOTE — Progress Notes (Addendum)
Virtual Visit via [email or web address] Note  This visit type was conducted due to national recommendations for restrictions regarding the COVID-19 pandemic (e.g. social distancing).  This format is felt to be most appropriate for this patient at this time.  All issues noted in this document were discussed and addressed.  No physical exam was performed (except for noted visual exam findings with Video Visits).   I connected with@ on 12/12/18 at  8:30 AM EDT by a video enabled telemedicine application  and verified that I am speaking with the correct person using two identifiers. Location patient: home Location provider: work or home office Persons participating in the virtual visit: patient, provider  I discussed the limitations, risks, security and privacy concerns of performing an evaluation and management service by telephone and the availability of in person appointments. I also discussed with the patient that there may be a patient responsible charge related to this service. The patient expressed understanding and agreed to proceed.   Reason for visit: establishment of care   HPI:  53 yr old female with history of hypertension, trigeminal neuralgia, advanced DJD s/p bilateral hip replacements presents for estalishment of care.  1) H/o HTN : diagnosed prior to 2015  . Stopped taking medication and lost weight; by 2016 no longer needed meds. .  BP  Noted to be elevated again in 2018 in setting of severe pain from hip DJD and concurrent migraine.  Has been back on meds since then . Amlodipine and meloxicam  Caused edema,  Both were stopped  Recently . Home readings have been checked at night and have been 190/100 confirmed by daughter who is an Therapist, sports  More recently 156/78 pulse 86.  Currently no longer taking amlodipine 7.5 mg. Was taking valsartan hct 80/12.5 , has been taking it for the past week , along with meloxicam  2) Right hip pain :  Still present although to a lesser degree post 2 surgeries,  Last one  THR in 2018  3) History of trigeminal neuralgia  PRESENT SINCE 2015 .L V2V3 DIVISONS AFFECTED   USED GABAPENTIN  priro use of lamictal per Neurology    4)  Irregular bowel movements  Alternates between constipation and diarrhea  Taking a probiotic   5) Chronic low back pain: persistent despite hip replacements.  MRI ordered  [facility].  Using meloxicam and gabapentin    ROS: See pertinent positives and negatives per HPI.  Past Medical History:  Diagnosis Date  . Abnormal Pap smear of cervix   . Abnormal vaginal bleeding   . Anxiety   . Arthritis    neck and body  . Complication of anesthesia    IT TAKES ALOT OF ANESTHESIA TO KEEP PT SEDATED  . Cyst of vulva   . Dyspareunia   . External hemorrhoid   . Fibroids   . GERD (gastroesophageal reflux disease)   . Headache 2019   migraines, becoming more frequent  . High cholesterol   . History of UTI   . Hypertension   . Left-sided face pain   . Neck pain   . Paresthesia   . Rectocele   . Surgical menopause   . Trigeminal neuralgia   . Uterus prolapse     Past Surgical History:  Procedure Laterality Date  . AUGMENTATION MAMMAPLASTY Bilateral 2006  . BREAST ENHANCEMENT SURGERY    . CHOLECYSTECTOMY    . HIP ARTHROSCOPY Right 05/05/2017   Procedure: ARTHROSCOPY HIP;  Surgeon: Leim Fabry, MD;  Location: [facility] ORS;  Service: Orthopedics;  Laterality: Right;  . LAPAROSCOPY     X2  . TOTAL HIP ARTHROPLASTY Right 10/26/2017   Procedure: TOTAL HIP ARTHROPLASTY ANTERIOR APPROACH;  Surgeon: [name], MD;  Location: [facility] ORS;  Service: Orthopedics;  Laterality: Right;  . TOTAL HIP ARTHROPLASTY Left 03/30/2018   Procedure: LEFT TOTAL HIP ARTHROPLASTY ANTERIOR APPROACH;  Surgeon: [name], MD;  Location: [facility];  Service: Orthopedics;  Laterality: Left;  . tummy tuck     . VAGINAL HYSTERECTOMY     lahv bso    Family History  Problem Relation Age of Onset  . Brain cancer Mother   . Dementia Father   .  Breast cancer Neg Hx     SOCIAL HX:  reports that she has never smoked. She has never used smokeless tobacco. She reports current alcohol use. She reports that she does not use drugs.   Current Outpatient Medications:  .  Biotin w/ Vitamins C & E (HAIR/SKIN/NAILS PO), Take 2 tablets by mouth every evening., Disp: , Rfl:  .  estradiol (ESTRACE) 2 MG tablet, Take 2 mg by mouth at bedtime. , Disp: , Rfl:  .  meloxicam (MOBIC) 7.5 MG tablet, Take 7.5 mg by mouth daily., Disp: , Rfl:  .  montelukast (SINGULAIR) 10 MG tablet, Take 10 mg by mouth at bedtime., Disp: , Rfl:  .  omeprazole (PRILOSEC OTC) 20 MG tablet, Take 20 mg by mouth daily as needed (acid reflux)., Disp: , Rfl:  .  OVER THE COUNTER MEDICATION, Take 3 tablets by mouth 2 (two) times daily. Fiber Well Gummies, Disp: , Rfl:  .  rizatriptan (MAXALT) 5 MG tablet, Take 5 mg by mouth every 2 (two) hours as needed for migraine., Disp: , Rfl: 0 .  valsartan-hydrochlorothiazide (DIOVAN-HCT) 80-12.5 MG tablet, Take 1 tablet by mouth daily., Disp: , Rfl:  .  amoxicillin (AMOXIL) 500 MG capsule, TAKE 1 CAPSULE BY MOUTH EVERY 8 HOURS UNTIL GONE, Disp: , Rfl:  .  gabapentin (NEURONTIN) 300 MG capsule, Take 2 capsules (600 mg total) by mouth 3 (three) times daily. (Patient not taking: Reported on 12/12/2018), Disp: 3090 capsule, Rfl: 3 .  nystatin (MYCOSTATIN) 100000 UNIT/ML suspension, , Disp: , Rfl:   EXAM:  VITALS per patient if applicable:  GENERAL: alert, oriented, appears well and in no acute distress  HEENT: atraumatic, conjunttiva clear, no obvious abnormalities on inspection of external nose and ears  NECK: normal movements of the head and neck  LUNGS: on inspection no signs of respiratory distress, breathing rate appears normal, no obvious gross SOB, gasping or wheezing  CV: no obvious cyanosis  MS: moves all visible extremities without noticeable abnormality  PSYCH/NEURO: pleasant and cooperative, no obvious depression or  anxiety, speech and thought processing grossly intact  ASSESSMENT AND PLAN:  Discussed the following assessment and plan:  Colon cancer screening - Plan: Ambulatory referral to Gastroenterology  Essential hypertension  Chronic idiopathic constipation - Plan: Ambulatory referral to Gastroenterology  Hypertension UNCONTROLLED currently on valsartan/hct,  May be due to use of meloxicam .  Will suspend NSAIDs and follow readings.   Constipation No warning signs of hematochezia or unintentional weight loss, hOwever she is due for colon CA screenign an will be referred for colonoscopy    I discussed the assessment and treatment plan with the patient. The patient was provided an opportunity to ask questions and all were answered. The patient agreed with the plan and demonstrated an understanding of the instructions.   The  patient was advised to call back or seek an in-person evaluation if the symptoms worsen or if the condition fails to improve as anticipated.  I provided 45 minutes of non-face-to-face time during this encounter reviewing patient's current problems and post surgeries.  Providing counseling on the above mentioned problems , and coordination  of care .  [name], MD

## 2018-12-12 NOTE — Assessment & Plan Note (Signed)
UNCONTROLLED currently on valsartan/hct,  May be due to use of meloxicam .  Will suspend NSAIDs and follow readings.

## 2018-12-12 NOTE — Assessment & Plan Note (Signed)
No warning signs of hematochezia or unintentional weight loss, hOwever she is due for colon CA screenign an will be referred for colonoscopy

## 2018-12-12 NOTE — Patient Instructions (Signed)
Suspend the meloxicam for one week. No  advil motrin or aleve either   You can add up to 2000 mg of acetominophen (tylenol) every day safely  In divided doses (500 mg every 6 hours  Or 1000 mg every 12 hours.)  Continue valsartan.hct    Continue to check BP once daily  And send me readings in one week  I will make a referral to GI for a screening colonoscopy

## 2018-12-13 ENCOUNTER — Ambulatory Visit: Payer: Self-pay

## 2018-12-13 NOTE — Telephone Encounter (Signed)
Pt. Reports she had frequency of urination and had blood and blood clots I her urine. Urine was orange in color and gradual became a light pink. No pain currently. No availability in the practice today per [place]. Practice recommends pt. Go to UC.  Answer Assessment - Initial Assessment Questions 1. COLOR of URINE: "Describe the color of the urine."  (e.g., tea-colored, pink, red, blood clots, bloody)     Orange 2. ONSET: "When did the bleeding start?"      Last night 3. EPISODES: "How many times has there been blood in the urine?" or "How many times today?"     Several times 4. PAIN with URINATION: "Is there any pain with passing your urine?" If so, ask: "How bad is the pain?"  (Scale 1-10; or mild, moderate, severe)    - MILD - complains slightly about urination hurting    - MODERATE - interferes with normal activities      - SEVERE - excruciating, unwilling or unable to urinate because of the pain      Mild 5. FEVER: "Do you have a fever?" If so, ask: "What is your temperature, how was it measured, and when did it start?"     No 6. ASSOCIATED SYMPTOMS: "Are you passing urine more frequently than usual?"     Frequency last night 7. OTHER SYMPTOMS: "Do you have any other symptoms?" (e.g., back/flank pain, abdominal pain, vomiting)     No 8. PREGNANCY: "Is there any chance you are pregnant?" "When was your last menstrual period?"     No  Protocols used: URINE - BLOOD IN-A-AH

## 2018-12-19 ENCOUNTER — Ambulatory Visit (INDEPENDENT_AMBULATORY_CARE_PROVIDER_SITE_OTHER): Payer: PRIVATE HEALTH INSURANCE | Admitting: Internal Medicine

## 2018-12-19 ENCOUNTER — Telehealth: Payer: Self-pay | Admitting: Orthopaedic Surgery

## 2018-12-19 ENCOUNTER — Encounter: Payer: Self-pay | Admitting: Internal Medicine

## 2018-12-19 VITALS — BP 130/84 | HR 61 | Ht 64.0 in | Wt 183.0 lb

## 2018-12-19 DIAGNOSIS — R5383 Other fatigue: Secondary | ICD-10-CM

## 2018-12-19 DIAGNOSIS — M19042 Primary osteoarthritis, left hand: Secondary | ICD-10-CM

## 2018-12-19 DIAGNOSIS — I10 Essential (primary) hypertension: Secondary | ICD-10-CM | POA: Diagnosis not present

## 2018-12-19 DIAGNOSIS — M130 Polyarthritis, unspecified: Secondary | ICD-10-CM | POA: Diagnosis not present

## 2018-12-19 DIAGNOSIS — E7800 Pure hypercholesterolemia, unspecified: Secondary | ICD-10-CM | POA: Diagnosis not present

## 2018-12-19 DIAGNOSIS — M19041 Primary osteoarthritis, right hand: Secondary | ICD-10-CM

## 2018-12-19 MED ORDER — VALSARTAN-HYDROCHLOROTHIAZIDE 160-12.5 MG PO TABS: 1.0000 | ORAL_TABLET | Freq: Every day | ORAL | 0 refills | Status: DC

## 2018-12-19 NOTE — Patient Instructions (Signed)
I am increasing the bp medication to valsartan 160 mg /hctz 12.5 mg  Goal bp is 100 to 120/60-75  Once at goal, we can resume meloxicam 7.5 mg daily for joint pain    Office will call you with fasting lab appointment and appt with me for CPE

## 2018-12-19 NOTE — Telephone Encounter (Signed)
Pt called in said she is wondering if it is okay for her to go ice skating since she did have surgery in January of 2020 for her hip replacement.   [phone]

## 2018-12-19 NOTE — Progress Notes (Signed)
Virtual Visit via [email or web address]  This visit type was conducted due to national recommendations for restrictions regarding the COVID-19 pandemic (e.g. social distancing).  This format is felt to be most appropriate for this patient at this time.  All issues noted in this document were discussed and addressed.  No physical exam was performed (except for noted visual exam findings with Video Visits).   I connected with@ on 12/19/18 at 10:30 AM EDT by a video enabled telemedicine application and verified that I am speaking with the correct person using two identifiers. Location patient: home Location provider: work or home office Persons participating in the virtual visit: patient, provider  I discussed the limitations, risks, security and privacy concerns of performing an evaluation and management service by telephone and the availability of in person appointments. I also discussed with the patient that there may be a patient responsible charge related to this service. The patient expressed understanding and agreed to proceed.  Reason for visit: follow up ,  One week   HPI:  53 yr old female with HTN on valsartan and NSAID asked to suspend NSAIDs for one week   bp better without nsaids but still above goal.  Readings from the past week reveiwed with patiet.  Since she has suspended meloxicam the pain in her fingers and lower back has been worse but not intolerable.   However she has had an increase in jaw pain  Has been to see oral surgery.  Told that she needs bone grafts per oral surgery   ROS: See pertinent positives and negatives per HPI.  Past Medical History:  Diagnosis Date  . Abnormal Pap smear of cervix   . Abnormal vaginal bleeding   . Anxiety   . Arthritis    neck and body  . Complication of anesthesia    IT TAKES ALOT OF ANESTHESIA TO KEEP PT SEDATED  . Cyst of vulva   . Dyspareunia   . External hemorrhoid   . Fibroids   . GERD (gastroesophageal reflux disease)   . Headache  2019   migraines, becoming more frequent  . High cholesterol   . History of UTI   . Hypertension   . Left-sided face pain   . Neck pain   . Paresthesia   . Rectocele   . Surgical menopause   . Trigeminal neuralgia   . Uterus prolapse     Past Surgical History:  Procedure Laterality Date  . AUGMENTATION MAMMAPLASTY Bilateral 2006  . BREAST ENHANCEMENT SURGERY    . CHOLECYSTECTOMY    . HIP ARTHROSCOPY Right 05/05/2017   Procedure: ARTHROSCOPY HIP;  Surgeon: Leim Fabry, MD;  Location: [facility];  Service: Orthopedics;  Laterality: Right;  . LAPAROSCOPY     X2  . TOTAL HIP ARTHROPLASTY Right 10/26/2017   Procedure: TOTAL HIP ARTHROPLASTY ANTERIOR APPROACH;  Surgeon: [name], MD;  Location: [facility];  Service: Orthopedics;  Laterality: Right;  . TOTAL HIP ARTHROPLASTY Left 03/30/2018   Procedure: LEFT TOTAL HIP ARTHROPLASTY ANTERIOR APPROACH;  Surgeon: [name], MD;  Location: [facility];  Service: Orthopedics;  Laterality: Left;  . tummy tuck     . VAGINAL HYSTERECTOMY     lahv bso    Family History  Problem Relation Age of Onset  . Brain cancer Mother   . Dementia Father   . Breast cancer Neg Hx     SOCIAL HX:  reports that she has never smoked. She has never used smokeless tobacco. She reports current alcohol  use. She reports that she does not use drugs.   Current Outpatient Medications:  .  amoxicillin (AMOXIL) 500 MG capsule, TAKE 1 CAPSULE BY MOUTH EVERY 8 HOURS UNTIL GONE, Disp: , Rfl:  .  Biotin w/ Vitamins C & E (HAIR/SKIN/NAILS PO), Take 2 tablets by mouth every evening., Disp: , Rfl:  .  estradiol (ESTRACE) 2 MG tablet, Take 2 mg by mouth at bedtime. , Disp: , Rfl:  .  meloxicam (MOBIC) 7.5 MG tablet, Take 7.5 mg by mouth daily., Disp: , Rfl:  .  montelukast (SINGULAIR) 10 MG tablet, Take 10 mg by mouth at bedtime., Disp: , Rfl:  .  nystatin (MYCOSTATIN) 100000 UNIT/ML suspension, , Disp: , Rfl:  .  omeprazole (PRILOSEC OTC) 20 MG tablet, Take 20  mg by mouth daily as needed (acid reflux)., Disp: , Rfl:  .  OVER THE COUNTER MEDICATION, Take 3 tablets by mouth 2 (two) times daily. Fiber Well Gummies, Disp: , Rfl:  .  rizatriptan (MAXALT) 5 MG tablet, Take 5 mg by mouth every 2 (two) hours as needed for migraine., Disp: , Rfl: 0 .  gabapentin (NEURONTIN) 300 MG capsule, Take 2 capsules (600 mg total) by mouth 3 (three) times daily. (Patient not taking: Reported on 12/12/2018), Disp: 3090 capsule, Rfl: 3 .  valsartan-hydrochlorothiazide (DIOVAN-HCT) 160-12.5 MG tablet, Take 1 tablet by mouth daily., Disp: 30 tablet, Rfl: 0  EXAM:  VITALS per patient if applicable:  GENERAL: alert, oriented, appears well and in no acute distress  HEENT: atraumatic, conjunttiva clear, no obvious abnormalities on inspection of external nose and ears  NECK: normal movements of the head and neck  LUNGS: on inspection no signs of respiratory distress, breathing rate appears normal, no obvious gross SOB, gasping or wheezing  CV: no obvious cyanosis  MS: moves all visible extremities without noticeable abnormality  PSYCH/NEURO: pleasant and cooperative, no obvious depression or anxiety, speech and thought processing grossly intact  ASSESSMENT AND PLAN:  Discussed the following assessment and plan:  Essential hypertension - Plan: Comprehensive metabolic panel  High cholesterol - Plan: Lipid panel  Fatigue, unspecified type - Plan: CBC with Differential/Platelet, TSH  Polyarthritis - Plan: Sedimentation rate, CANCELED: Sedimentation rate  Osteoarthritis of fingers of both hands  Hypertension Not at goal based on home readings.  Increase valsarta to 160/12.5 continue to suspend NSAID until bp is at goal   Osteoarthritis of fingers of both hands previously managed with NSAID currently suspended  Secondary to  uncontrolled hypertension  Advised to use tylenol     I discussed the assessment and treatment plan with the patient. The patient was  provided an opportunity to ask questions and all were answered. The patient agreed with the plan and demonstrated an understanding of the instructions.   The patient was advised to call back or seek an in-person evaluation if the symptoms worsen or if the condition fails to improve as anticipated.   [name], MD   I provided  25 minutes of non-face-to-face time during this encounter reviewing patient's current problems and post surgeries.  Providing counseling on the above mentioned problems , and coordination  of care .

## 2018-12-19 NOTE — Telephone Encounter (Signed)
Please advise 

## 2018-12-19 NOTE — Progress Notes (Signed)
Yesterday 137/82 pulse 57 upon waking

## 2018-12-19 NOTE — Telephone Encounter (Signed)
I am fine with her going ice-skating.

## 2018-12-20 NOTE — Telephone Encounter (Signed)
Left message advising

## 2018-12-21 DIAGNOSIS — M19041 Primary osteoarthritis, right hand: Secondary | ICD-10-CM | POA: Insufficient documentation

## 2018-12-21 DIAGNOSIS — M19042 Primary osteoarthritis, left hand: Secondary | ICD-10-CM | POA: Insufficient documentation

## 2018-12-21 NOTE — Assessment & Plan Note (Signed)
previously managed with NSAID currently suspended  Secondary to  uncontrolled hypertension  Advised to use tylenol

## 2018-12-21 NOTE — Assessment & Plan Note (Signed)
Not at goal based on home readings.  Increase valsarta to 160/12.5 continue to suspend NSAID until bp is at goal

## 2019-01-15 ENCOUNTER — Telehealth: Payer: Self-pay

## 2019-01-15 NOTE — Telephone Encounter (Signed)
LMTCB. Need to schedule pt for a fasting lab appt.  

## 2019-01-18 ENCOUNTER — Other Ambulatory Visit: Payer: Self-pay

## 2019-01-18 ENCOUNTER — Other Ambulatory Visit (INDEPENDENT_AMBULATORY_CARE_PROVIDER_SITE_OTHER): Payer: PRIVATE HEALTH INSURANCE

## 2019-01-18 DIAGNOSIS — R5383 Other fatigue: Secondary | ICD-10-CM

## 2019-01-18 DIAGNOSIS — E7800 Pure hypercholesterolemia, unspecified: Secondary | ICD-10-CM | POA: Diagnosis not present

## 2019-01-18 DIAGNOSIS — I10 Essential (primary) hypertension: Secondary | ICD-10-CM

## 2019-01-18 DIAGNOSIS — M130 Polyarthritis, unspecified: Secondary | ICD-10-CM

## 2019-01-18 LAB — COMPREHENSIVE METABOLIC PANEL
ALT: 13 U/L (ref 0–35)
AST: 15 U/L (ref 0–37)
Albumin: 4.4 g/dL (ref 3.5–5.2)
Alkaline Phosphatase: 53 U/L (ref 39–117)
BUN: 12 mg/dL (ref 6–23)
CO2: 26 mEq/L (ref 19–32)
Calcium: 9.7 mg/dL (ref 8.4–10.5)
Chloride: 99 mEq/L (ref 96–112)
Creatinine, Ser: 0.83 mg/dL (ref 0.40–1.20)
GFR: 72.46 mL/min (ref >60.00)
Glucose, Bld: 89 mg/dL (ref 70–99)
Potassium: 4.4 mEq/L (ref 3.5–5.1)
Sodium: 137 mEq/L (ref 135–145)
Total Bilirubin: 0.4 mg/dL (ref 0.2–1.2)
Total Protein: 7.6 g/dL (ref 6.0–8.3)

## 2019-01-18 LAB — CBC WITH DIFFERENTIAL/PLATELET
Basophils Absolute: 0 10*3/uL (ref 0.0–0.1)
Basophils Relative: 0.7 % (ref 0.0–3.0)
Eosinophils Absolute: 0.2 10*3/uL (ref 0.0–0.7)
Eosinophils Relative: 3 % (ref 0.0–5.0)
HCT: 39.1 % (ref 36.0–46.0)
Hemoglobin: 13.3 g/dL (ref 12.0–15.0)
Lymphocytes Relative: 37.4 % (ref 12.0–46.0)
Lymphs Abs: 2.5 10*3/uL (ref 0.7–4.0)
MCHC: 33.9 g/dL (ref 30.0–36.0)
MCV: 88.5 fl (ref 78.0–100.0)
Monocytes Absolute: 0.5 10*3/uL (ref 0.1–1.0)
Monocytes Relative: 7.2 % (ref 3.0–12.0)
Neutro Abs: 3.4 10*3/uL (ref 1.4–7.7)
Neutrophils Relative %: 51.7 % (ref 43.0–77.0)
Platelets: 266 10*3/uL (ref 150.0–400.0)
RBC: 4.41 Mil/uL (ref 3.87–5.11)
RDW: 13.5 % (ref 11.5–15.5)
WBC: 6.7 10*3/uL (ref 4.0–10.5)

## 2019-01-18 LAB — LIPID PANEL
Cholesterol: 252 mg/dL — ABNORMAL HIGH (ref 0–200)
HDL: 56.9 mg/dL (ref >39.00)
NonHDL: 195.46
Total CHOL/HDL Ratio: 4
Triglycerides: 238 mg/dL — ABNORMAL HIGH (ref 0.0–149.0)
VLDL: 47.6 mg/dL — ABNORMAL HIGH (ref 0.0–40.0)

## 2019-01-18 LAB — TSH: TSH: 3.49 u[IU]/mL (ref 0.35–4.50)

## 2019-01-18 LAB — LDL CHOLESTEROL, DIRECT: Direct LDL: 145 mg/dL

## 2019-01-18 LAB — SEDIMENTATION RATE: Sed Rate: 37 mm/hr — ABNORMAL HIGH (ref 0–30)

## 2019-01-20 ENCOUNTER — Other Ambulatory Visit: Payer: Self-pay | Admitting: Internal Medicine

## 2019-01-20 ENCOUNTER — Telehealth: Payer: Self-pay | Admitting: Internal Medicine

## 2019-01-21 ENCOUNTER — Other Ambulatory Visit: Payer: Self-pay | Admitting: Internal Medicine

## 2019-01-21 DIAGNOSIS — Z1231 Encounter for screening mammogram for malignant neoplasm of breast: Secondary | ICD-10-CM

## 2019-01-21 MED ORDER — MELOXICAM 7.5 MG PO TABS: 7.5000 mg | ORAL_TABLET | Freq: Every day | ORAL | 0 refills | Status: DC

## 2019-01-23 ENCOUNTER — Encounter: Payer: Self-pay | Admitting: Gastroenterology

## 2019-01-23 ENCOUNTER — Ambulatory Visit (INDEPENDENT_AMBULATORY_CARE_PROVIDER_SITE_OTHER): Payer: PRIVATE HEALTH INSURANCE | Admitting: Gastroenterology

## 2019-01-23 ENCOUNTER — Other Ambulatory Visit: Payer: Self-pay

## 2019-01-23 VITALS — BP 146/84 | HR 73 | Temp 98.6°F | Wt 196.1 lb

## 2019-01-23 DIAGNOSIS — Z1211 Encounter for screening for malignant neoplasm of colon: Secondary | ICD-10-CM | POA: Diagnosis not present

## 2019-01-23 DIAGNOSIS — K5900 Constipation, unspecified: Secondary | ICD-10-CM | POA: Diagnosis not present

## 2019-01-23 MED ORDER — POLYETHYLENE GLYCOL 3350 17 G PO PACK: 17.0000 g | PACK | Freq: Every day | ORAL | 0 refills | Status: DC

## 2019-01-23 MED ORDER — BISACODYL EC 5 MG PO TBEC: DELAYED_RELEASE_TABLET | ORAL | 0 refills | Status: DC

## 2019-01-23 MED ORDER — NA SULFATE-K SULFATE-MG SULF 17.5-3.13-1.6 GM/177ML PO SOLN: 708.0000 | Freq: Once | ORAL | 0 refills | Status: AC

## 2019-01-23 NOTE — Progress Notes (Signed)
Paula Moses [address]  [place]  [place], [place] [address]  Main: [phone]  Fax: [phone]   Gastroenterology Consultation  Referring Provider:     [name], MD Primary Care Physician:  [name], MD Reason for Consultation:   Constipation        HPI:    Chief Complaint  Patient presents with  . New Patient (Initial Visit)  . Constipation    Patient takes a laxtive every day     Paula Moses is a 53 y.o. y/o female referred for consultation & management  by Dr. [name], MD.  Patient reports constipation for years.  Takes bisacodyl 5 mg every night which will help her go once a day.  Without that she is not able to have a bowel movement.  Also reports abdominal bloating and cramping due to this.  No nausea or vomiting.  No weight loss.  Reports feeling some skin tags when she wipes and reports seeing intermittent bright red blood per rectum as well.  No prior colonoscopy.  No family history of colon cancer.  No dysphagia or heartburn.  Past Medical History:  Diagnosis Date  . Abnormal Pap smear of cervix   . Abnormal vaginal bleeding   . Anxiety   . Arthritis    neck and body  . Complication of anesthesia    IT TAKES ALOT OF ANESTHESIA TO KEEP PT SEDATED  . Cyst of vulva   . Dyspareunia   . External hemorrhoid   . Fibroids   . GERD (gastroesophageal reflux disease)   . Headache 2019   migraines, becoming more frequent  . High cholesterol   . History of UTI   . Hypertension   . Left-sided face pain   . Neck pain   . Paresthesia   . Rectocele   . Surgical menopause   . Trigeminal neuralgia   . Uterus prolapse     Past Surgical History:  Procedure Laterality Date  . AUGMENTATION MAMMAPLASTY Bilateral 2006  . BREAST ENHANCEMENT SURGERY    . CHOLECYSTECTOMY    . HIP ARTHROSCOPY Right 05/05/2017   Procedure: ARTHROSCOPY HIP;  Surgeon: Leim Fabry, MD;  Location: [facility];  Service: Orthopedics;  Laterality: Right;   . LAPAROSCOPY     X2  . TOTAL HIP ARTHROPLASTY Right 10/26/2017   Procedure: TOTAL HIP ARTHROPLASTY ANTERIOR APPROACH;  Surgeon: [name], MD;  Location: [facility];  Service: Orthopedics;  Laterality: Right;  . TOTAL HIP ARTHROPLASTY Left 03/30/2018   Procedure: LEFT TOTAL HIP ARTHROPLASTY ANTERIOR APPROACH;  Surgeon: [name], MD;  Location: [facility];  Service: Orthopedics;  Laterality: Left;  . tummy tuck     . VAGINAL HYSTERECTOMY     lahv bso    Prior to Admission medications   Medication Sig Start Date End Date Taking? Authorizing Provider  Azelastine HCl 137 MCG/SPRAY SOLN Place 2 sprays into both nostrils 2 (two) times daily. 01/18/19  Yes [provider]  Biotin w/ Vitamins C & E (HAIR/SKIN/NAILS PO) Take 2 tablets by mouth every evening.   Yes [provider]  estradiol (ESTRACE) 2 MG tablet Take 2 mg by mouth at bedtime.    Yes [provider]  gabapentin (NEURONTIN) 300 MG capsule Take 2 capsules (600 mg total) by mouth 3 (three) times daily. 05/07/18  Yes [name], MD  meloxicam (MOBIC) 7.5 MG tablet Take 1 tablet (7.5 mg total) by mouth daily. 01/21/19  Yes [name], MD  montelukast (SINGULAIR) 10 MG tablet Take 10 mg by mouth at bedtime.   Yes [provider]  omeprazole (PRILOSEC OTC) 20 MG tablet Take 20 mg by mouth daily as needed (acid reflux).   Yes [provider]  OVER THE COUNTER MEDICATION Take 3 tablets by mouth 2 (two) times daily. Fiber Well Gummies   Yes [provider]  rizatriptan (MAXALT) 5 MG tablet Take 5 mg by mouth every 2 (two) hours as needed for migraine. 09/21/17  Yes [provider]  valsartan-hydrochlorothiazide (DIOVAN-HCT) 160-12.5 MG tablet Take 1 tablet by mouth once daily 01/21/19  Yes [name], MD  bisacodyl (BISACODYL) 5 MG EC tablet Take 2 tablets (10mg ) by mouth the day before your procedure between 1pm-3pm. 01/23/19   [name], MD   Na Sulfate-K Sulfate-Mg Sulf 17.5-3.13-1.6 GM/177ML SOLN Take 708 each by mouth once for 1 dose. 01/23/19 01/23/19  [name], MD  polyethylene glycol (MIRALAX MIX-IN PAX) 17 g packet Take 17 g by mouth daily. 01/23/19   [name], MD    Family History  Problem Relation Age of Onset  . Brain cancer Mother   . Dementia Father   . Breast cancer Neg Hx      Social History   Tobacco Use  . Smoking status: Never Smoker  . Smokeless tobacco: Never Used  Substance Use Topics  . Alcohol use: Yes    Comment: occas  . Drug use: No    Allergies as of 01/23/2019 - Review Complete 01/23/2019  Allergen Reaction Noted  . Aspirin Shortness Of Breath and Other (See Comments) 01/26/2017  . Codeine Itching and Other (See Comments) 07/16/2013  . Percocet [oxycodone-acetaminophen] Itching and Other (See Comments) 07/16/2013  . Phenergan [promethazine hcl] Itching and Other (See Comments) 07/16/2013  . Tape Other (See Comments) 01/31/2018  . Tramadol  12/19/2018    Review of Systems:    All systems reviewed and negative except where noted in HPI.   Physical Exam:  BP (!) 146/84 (BP Location: Left Arm, Patient Position: Sitting, Cuff Size: Normal)   Pulse 73   Temp 98.6 F (37 C) (Oral)   Wt 196 lb 2 oz (89 kg)   BMI 33.66 kg/m  No LMP recorded. Patient has had a hysterectomy. Psych:  Alert and cooperative. Normal mood and affect. General:   Alert,  Well-developed, well-nourished, pleasant and cooperative in NAD Head:  Normocephalic and atraumatic. Eyes:  Sclera clear, no icterus.   Conjunctiva pink. Ears:  Normal auditory acuity. Nose:  No deformity, discharge, or lesions. Mouth:  No deformity or lesions,oropharynx pink & moist. Neck:  Supple; no masses or thyromegaly. Abdomen:  Normal bowel sounds.  No bruits.  Soft, non-tender and non-distended without masses, hepatosplenomegaly or hernias noted.  No guarding or rebound tenderness.    Msk:  Symmetrical without  gross deformities. Good, equal movement & strength bilaterally. Pulses:  Normal pulses noted. Extremities:  No clubbing or edema.  No cyanosis. Neurologic:  Alert and oriented x3;  grossly normal neurologically. Skin:  Intact without significant lesions or rashes. No jaundice. Lymph Nodes:  No significant cervical adenopathy. Psych:  Alert and cooperative. Normal mood and affect.   Labs: CBC    Component Value Date/Time   WBC 6.7 01/18/2019 1041   RBC 4.41 01/18/2019 1041   HGB 13.3 01/18/2019 1041   HGB 8.6 (L) 11/26/2013 0535   HCT 39.1 01/18/2019 1041   HCT 34.6 (L)  11/19/2013 1020   PLT 266.0 01/18/2019 1041   PLT 247 11/19/2013 1020   MCV 88.5 01/18/2019 1041   MCV 86 11/19/2013 1020   MCH 30.7 03/31/2018 0341   MCHC 33.9 01/18/2019 1041   RDW 13.5 01/18/2019 1041   RDW 14.9 (H) 11/19/2013 1020   LYMPHSABS 2.5 01/18/2019 1041   MONOABS 0.5 01/18/2019 1041   EOSABS 0.2 01/18/2019 1041   BASOSABS 0.0 01/18/2019 1041   CMP     Component Value Date/Time   NA 137 01/18/2019 1041   NA 136 11/19/2013 1020   K 4.4 01/18/2019 1041   K 3.7 11/19/2013 1020   CL 99 01/18/2019 1041   CL 100 11/19/2013 1020   CO2 26 01/18/2019 1041   CO2 29 11/19/2013 1020   GLUCOSE 89 01/18/2019 1041   GLUCOSE 93 11/19/2013 1020   BUN 12 01/18/2019 1041   BUN 13 11/19/2013 1020   CREATININE 0.83 01/18/2019 1041   CREATININE 1.01 11/19/2013 1020   CALCIUM 9.7 01/18/2019 1041   CALCIUM 9.0 11/19/2013 1020   PROT 7.6 01/18/2019 1041   PROT 7.8 07/16/2013 1136   ALBUMIN 4.4 01/18/2019 1041   AST 15 01/18/2019 1041   ALT 13 01/18/2019 1041   ALKPHOS 53 01/18/2019 1041   BILITOT 0.4 01/18/2019 1041   GFRNONAA >60 03/31/2018 0341   GFRNONAA >60 11/19/2013 1020   GFRAA >60 03/31/2018 0341   GFRAA >60 11/19/2013 1020    Imaging Studies: No results found.  Assessment and Plan:   Paula Moses is a 53 y.o. y/o female has been referred for constipation  High-fiber diet MiraLAX  daily with goal of 1-2 soft bowel movements daily.  If not at goal, patient instructed to increase dose to twice daily.  If loose stools with the medication, patient asked to decrease the medication to every other day, or half dose daily.  Patient verbalized understanding Stop bisacodyl  Patient is willing to schedule screening colonoscopy as well  We will evaluate for hemorrhoids during the procedure Intermittent red blood per rectum is likely due to underlying hemorrhoids from constipation  I have discussed alternative options, risks & benefits,  which include, but are not limited to, bleeding, infection, perforation,respiratory complication & drug reaction.  The patient agrees with this plan & written consent will be obtained.       Dr Paula Moses  Speech recognition software was used to dictate the above note.

## 2019-01-23 NOTE — Patient Instructions (Addendum)
High-Fiber Diet Fiber, also called dietary fiber, is a type of carbohydrate that is found in fruits, vegetables, whole grains, and beans. A high-fiber diet can have many health benefits. Your health care provider may recommend a high-fiber diet to help:  Prevent constipation. Fiber can make your bowel movements more regular.  Lower your cholesterol.  Relieve the following conditions: ? Swelling of veins in the anus (hemorrhoids). ? Swelling and irritation (inflammation) of specific areas of the digestive tract (uncomplicated diverticulosis). ? A problem of the large intestine (colon) that sometimes causes pain and diarrhea (irritable bowel syndrome, IBS).  Prevent overeating as part of a weight-loss plan.  Prevent heart disease, type 2 diabetes, and certain cancers. What is my plan? The recommended daily fiber intake in grams (g) includes:  38 g for men age 60 or younger.  30 g for men over age 55.  45 g for women age 54 or younger.  21 g for women over age 81. You can get the recommended daily intake of dietary fiber by:  Eating a variety of fruits, vegetables, grains, and beans.  Taking a fiber supplement, if it is not possible to get enough fiber through your diet. What do I need to know about a high-fiber diet?  It is better to get fiber through food sources rather than from fiber supplements. There is not a lot of research about how effective supplements are.  Always check the fiber content on the nutrition facts label of any prepackaged food. Look for foods that contain 5 g of fiber or more per serving.  Talk with a diet and nutrition specialist (dietitian) if you have questions about specific foods that are recommended or not recommended for your medical condition, especially if those foods are not listed below.  Gradually increase how much fiber you consume. If you increase your intake of dietary fiber too quickly, you may have bloating, cramping, or gas.  Drink plenty  of water. Water helps you to digest fiber. What are tips for following this plan?  Eat a wide variety of high-fiber foods.  Make sure that half of the grains that you eat each day are whole grains.  Eat breads and cereals that are made with whole-grain flour instead of refined flour or white flour.  Eat brown rice, bulgur wheat, or millet instead of white rice.  Start the day with a breakfast that is high in fiber, such as a cereal that contains 5 g of fiber or more per serving.  Use beans in place of meat in soups, salads, and pasta dishes.  Eat high-fiber snacks, such as berries, raw vegetables, nuts, and popcorn.  Choose whole fruits and vegetables instead of processed forms like juice or sauce. What foods can I eat?  Fruits Berries. Pears. Apples. Oranges. Avocado. Prunes and raisins. Dried figs. Vegetables Sweet potatoes. Spinach. Kale. Artichokes. Cabbage. Broccoli. Cauliflower. Green peas. Carrots. Squash. Grains Whole-grain breads. Multigrain cereal. Oats and oatmeal. Brown rice. Barley. Bulgur wheat. [place]. Quinoa. Bran muffins. Popcorn. Rye wafer crackers. Meats and other proteins Navy, kidney, and pinto beans. Soybeans. Split peas. Lentils. Nuts and seeds. Dairy Fiber-fortified yogurt. Beverages Fiber-fortified soy milk. Fiber-fortified orange juice. Other foods Fiber bars. The items listed above may not be a complete list of recommended foods and beverages. Contact a dietitian for more options. What foods are not recommended? Fruits Fruit juice. Cooked, strained fruit. Vegetables Fried potatoes. Canned vegetables. Well-cooked vegetables. Grains White bread. Pasta made with refined flour. White rice. Meats and other  proteins Fatty cuts of meat. Fried chicken or fried fish. Dairy Milk. Yogurt. Cream cheese. Sour cream. Fats and oils Butters. Beverages Soft drinks. Other foods Cakes and pastries. The items listed above may not be a complete list of foods  and beverages to avoid. Contact a dietitian for more information. Summary  Fiber is a type of carbohydrate. It is found in fruits, vegetables, whole grains, and beans.  There are many health benefits of eating a high-fiber diet, such as preventing constipation, lowering blood cholesterol, helping with weight loss, and reducing your risk of heart disease, diabetes, and certain cancers.  Gradually increase your intake of fiber. Increasing too fast can result in cramping, bloating, and gas. Drink plenty of water while you increase your fiber.  The best sources of fiber include whole fruits and vegetables, whole grains, nuts, seeds, and beans. This information is not intended to replace advice given to you by your health care provider. Make sure you discuss any questions you have with your health care provider. Document Released: 02/28/2005 Document Revised: 01/02/2017 Document Reviewed: 01/02/2017 Elsevier Patient Education  [place].  High-Fiber Diet Fiber, also called dietary fiber, is a type of carbohydrate that is found in fruits, vegetables, whole grains, and beans. A high-fiber diet can have many health benefits. Your health care provider may recommend a high-fiber diet to help:  Prevent constipation. Fiber can make your bowel movements more regular.  Lower your cholesterol.  Relieve the following conditions: ? Swelling of veins in the anus (hemorrhoids). ? Swelling and irritation (inflammation) of specific areas of the digestive tract (uncomplicated diverticulosis). ? A problem of the large intestine (colon) that sometimes causes pain and diarrhea (irritable bowel syndrome, IBS).  Prevent overeating as part of a weight-loss plan.  Prevent heart disease, type 2 diabetes, and certain cancers. What is my plan? The recommended daily fiber intake in grams (g) includes:  38 g for men age 17 or younger.  30 g for men over age 26.  25 g for women age 14 or younger.  21 g  for women over age 25. You can get the recommended daily intake of dietary fiber by:  Eating a variety of fruits, vegetables, grains, and beans.  Taking a fiber supplement, if it is not possible to get enough fiber through your diet. What do I need to know about a high-fiber diet?  It is better to get fiber through food sources rather than from fiber supplements. There is not a lot of research about how effective supplements are.  Always check the fiber content on the nutrition facts label of any prepackaged food. Look for foods that contain 5 g of fiber or more per serving.  Talk with a diet and nutrition specialist (dietitian) if you have questions about specific foods that are recommended or not recommended for your medical condition, especially if those foods are not listed below.  Gradually increase how much fiber you consume. If you increase your intake of dietary fiber too quickly, you may have bloating, cramping, or gas.  Drink plenty of water. Water helps you to digest fiber. What are tips for following this plan?  Eat a wide variety of high-fiber foods.  Make sure that half of the grains that you eat each day are whole grains.  Eat breads and cereals that are made with whole-grain flour instead of refined flour or white flour.  Eat brown rice, bulgur wheat, or millet instead of white rice.  Start the day with a  breakfast that is high in fiber, such as a cereal that contains 5 g of fiber or more per serving.  Use beans in place of meat in soups, salads, and pasta dishes.  Eat high-fiber snacks, such as berries, raw vegetables, nuts, and popcorn.  Choose whole fruits and vegetables instead of processed forms like juice or sauce. What foods can I eat?  Fruits Berries. Pears. Apples. Oranges. Avocado. Prunes and raisins. Dried figs. Vegetables Sweet potatoes. Spinach. Kale. Artichokes. Cabbage. Broccoli. Cauliflower. Green peas. Carrots. Squash. Grains Whole-grain  breads. Multigrain cereal. Oats and oatmeal. Brown rice. Barley. Bulgur wheat. [place]. Quinoa. Bran muffins. Popcorn. Rye wafer crackers. Meats and other proteins Navy, kidney, and pinto beans. Soybeans. Split peas. Lentils. Nuts and seeds. Dairy Fiber-fortified yogurt. Beverages Fiber-fortified soy milk. Fiber-fortified orange juice. Other foods Fiber bars. The items listed above may not be a complete list of recommended foods and beverages. Contact a dietitian for more options. What foods are not recommended? Fruits Fruit juice. Cooked, strained fruit. Vegetables Fried potatoes. Canned vegetables. Well-cooked vegetables. Grains White bread. Pasta made with refined flour. White rice. Meats and other proteins Fatty cuts of meat. Fried chicken or fried fish. Dairy Milk. Yogurt. Cream cheese. Sour cream. Fats and oils Butters. Beverages Soft drinks. Other foods Cakes and pastries. The items listed above may not be a complete list of foods and beverages to avoid. Contact a dietitian for more information. Summary  Fiber is a type of carbohydrate. It is found in fruits, vegetables, whole grains, and beans.  There are many health benefits of eating a high-fiber diet, such as preventing constipation, lowering blood cholesterol, helping with weight loss, and reducing your risk of heart disease, diabetes, and certain cancers.  Gradually increase your intake of fiber. Increasing too fast can result in cramping, bloating, and gas. Drink plenty of water while you increase your fiber.  The best sources of fiber include whole fruits and vegetables, whole grains, nuts, seeds, and beans. This information is not intended to replace advice given to you by your health care provider. Make sure you discuss any questions you have with your health care provider. Document Released: 02/28/2005 Document Revised: 01/02/2017 Document Reviewed: 01/02/2017 Elsevier Patient Education  2020 [employer].

## 2019-01-25 ENCOUNTER — Telehealth: Payer: Self-pay

## 2019-01-25 NOTE — Telephone Encounter (Signed)
Called patient to confirm patient procedure appointment and to remind patient that they have to go for a COVID test on 01/28/2019.  Left a detail message and sent a mychart message

## 2019-01-28 ENCOUNTER — Telehealth: Payer: Self-pay | Admitting: Gastroenterology

## 2019-01-28 ENCOUNTER — Other Ambulatory Visit: Payer: PRIVATE HEALTH INSURANCE

## 2019-01-28 NOTE — Telephone Encounter (Signed)
Pt left vm regarding questions about her Covid test  She was unable to go to get tested this morning

## 2019-01-28 NOTE — Telephone Encounter (Signed)
Patient waited in line in the community testing line not the [facility]. Informed patient to go to the [facility] building first thing in the morning at 8am. To get COVID test

## 2019-01-29 ENCOUNTER — Other Ambulatory Visit: Payer: Self-pay

## 2019-01-29 ENCOUNTER — Other Ambulatory Visit
Admission: RE | Admit: 2019-01-29 | Discharge: 2019-01-29 | Disposition: A | Discharge status: Home / Self Care | Payer: PRIVATE HEALTH INSURANCE | Source: Ambulatory Visit | Attending: Gastroenterology | Admitting: Gastroenterology

## 2019-01-29 DIAGNOSIS — Z01812 Encounter for preprocedural laboratory examination: Secondary | ICD-10-CM | POA: Diagnosis present

## 2019-01-29 DIAGNOSIS — Z20828 Contact with and (suspected) exposure to other viral communicable diseases: Secondary | ICD-10-CM | POA: Insufficient documentation

## 2019-01-29 LAB — SARS CORONAVIRUS 2 (TAT 6-24 HRS): SARS Coronavirus 2: NEGATIVE

## 2019-01-31 ENCOUNTER — Encounter
Admission: RE | Disposition: A | Discharge status: Home / Self Care | Payer: Self-pay | Source: Home / Self Care | Attending: Gastroenterology

## 2019-01-31 ENCOUNTER — Other Ambulatory Visit: Payer: Self-pay

## 2019-01-31 ENCOUNTER — Ambulatory Visit: Payer: PRIVATE HEALTH INSURANCE | Admitting: Certified Registered"

## 2019-01-31 ENCOUNTER — Ambulatory Visit
Admission: RE | Admit: 2019-01-31 | Discharge: 2019-01-31 | Disposition: A | Discharge status: Home / Self Care | Payer: PRIVATE HEALTH INSURANCE | Attending: Gastroenterology | Admitting: Gastroenterology

## 2019-01-31 DIAGNOSIS — I10 Essential (primary) hypertension: Secondary | ICD-10-CM | POA: Diagnosis not present

## 2019-01-31 DIAGNOSIS — M1990 Unspecified osteoarthritis, unspecified site: Secondary | ICD-10-CM | POA: Insufficient documentation

## 2019-01-31 DIAGNOSIS — Z79899 Other long term (current) drug therapy: Secondary | ICD-10-CM | POA: Insufficient documentation

## 2019-01-31 DIAGNOSIS — Z96643 Presence of artificial hip joint, bilateral: Secondary | ICD-10-CM | POA: Insufficient documentation

## 2019-01-31 DIAGNOSIS — K648 Other hemorrhoids: Secondary | ICD-10-CM | POA: Insufficient documentation

## 2019-01-31 DIAGNOSIS — K635 Polyp of colon: Secondary | ICD-10-CM | POA: Insufficient documentation

## 2019-01-31 DIAGNOSIS — Z791 Long term (current) use of non-steroidal anti-inflammatories (NSAID): Secondary | ICD-10-CM | POA: Insufficient documentation

## 2019-01-31 DIAGNOSIS — E7800 Pure hypercholesterolemia, unspecified: Secondary | ICD-10-CM | POA: Diagnosis not present

## 2019-01-31 DIAGNOSIS — K219 Gastro-esophageal reflux disease without esophagitis: Secondary | ICD-10-CM | POA: Diagnosis not present

## 2019-01-31 DIAGNOSIS — Z888 Allergy status to other drugs, medicaments and biological substances status: Secondary | ICD-10-CM | POA: Insufficient documentation

## 2019-01-31 DIAGNOSIS — G43909 Migraine, unspecified, not intractable, without status migrainosus: Secondary | ICD-10-CM | POA: Insufficient documentation

## 2019-01-31 DIAGNOSIS — G709 Myoneural disorder, unspecified: Secondary | ICD-10-CM | POA: Diagnosis not present

## 2019-01-31 DIAGNOSIS — Z885 Allergy status to narcotic agent status: Secondary | ICD-10-CM | POA: Insufficient documentation

## 2019-01-31 DIAGNOSIS — K621 Rectal polyp: Secondary | ICD-10-CM

## 2019-01-31 DIAGNOSIS — D128 Benign neoplasm of rectum: Secondary | ICD-10-CM | POA: Diagnosis not present

## 2019-01-31 DIAGNOSIS — Z1211 Encounter for screening for malignant neoplasm of colon: Secondary | ICD-10-CM | POA: Insufficient documentation

## 2019-01-31 DIAGNOSIS — Z886 Allergy status to analgesic agent status: Secondary | ICD-10-CM | POA: Insufficient documentation

## 2019-01-31 DIAGNOSIS — D120 Benign neoplasm of cecum: Secondary | ICD-10-CM | POA: Diagnosis not present

## 2019-01-31 HISTORY — PX: COLONOSCOPY WITH PROPOFOL: SHX5780

## 2019-01-31 SURGERY — COLONOSCOPY WITH PROPOFOL
Anesthesia: General

## 2019-01-31 MED ORDER — PHENYLEPHRINE HCL (PRESSORS) 10 MG/ML IV SOLN
INTRAVENOUS | Status: DC | PRN
  Administered 2019-01-31 (×2): 100 ug via INTRAVENOUS

## 2019-01-31 MED ORDER — MIDAZOLAM HCL 2 MG/2ML IJ SOLN
INTRAMUSCULAR | Status: AC
  Filled 2019-01-31: qty 2

## 2019-01-31 MED ORDER — SODIUM CHLORIDE 0.9 % IV SOLN
INTRAVENOUS | Status: DC
  Administered 2019-01-31: 11:00:00 via INTRAVENOUS

## 2019-01-31 MED ORDER — MIDAZOLAM HCL 2 MG/2ML IJ SOLN
INTRAMUSCULAR | Status: DC | PRN
  Administered 2019-01-31: 2 mg via INTRAVENOUS

## 2019-01-31 MED ORDER — PROPOFOL 10 MG/ML IV BOLUS
INTRAVENOUS | Status: DC | PRN
  Administered 2019-01-31: 50 mg via INTRAVENOUS
  Administered 2019-01-31: 20 mg via INTRAVENOUS

## 2019-01-31 MED ORDER — ONDANSETRON HCL 4 MG/2ML IJ SOLN
INTRAMUSCULAR | Status: DC | PRN
  Administered 2019-01-31: 4 mg via INTRAVENOUS

## 2019-01-31 MED ORDER — ONDANSETRON HCL 4 MG/2ML IJ SOLN
INTRAMUSCULAR | Status: AC
  Filled 2019-01-31: qty 2

## 2019-01-31 MED ORDER — LIDOCAINE HCL (PF) 2 % IJ SOLN
INTRAMUSCULAR | Status: AC
  Filled 2019-01-31: qty 10

## 2019-01-31 MED ORDER — PROPOFOL 10 MG/ML IV BOLUS
INTRAVENOUS | Status: AC
  Filled 2019-01-31: qty 60

## 2019-01-31 MED ORDER — PROPOFOL 500 MG/50ML IV EMUL
INTRAVENOUS | Status: DC | PRN
  Administered 2019-01-31: 175 ug/kg/min via INTRAVENOUS

## 2019-01-31 MED ORDER — LIDOCAINE HCL (CARDIAC) PF 100 MG/5ML IV SOSY
PREFILLED_SYRINGE | INTRAVENOUS | Status: DC | PRN
  Administered 2019-01-31: 80 mg via INTRAVENOUS

## 2019-01-31 NOTE — Transfer of Care (Signed)
Immediate Anesthesia Transfer of Care Note  Patient: Paula Moses  Procedure(s) Performed: COLONOSCOPY WITH PROPOFOL (N/A )  Patient Location: PACU  Anesthesia Type:General  Level of Consciousness: awake, alert  and oriented  Airway & Oxygen Therapy: Patient Spontanous Breathing  Post-op Assessment: Report given to RN and Post -op Vital signs reviewed and stable  Post vital signs: Reviewed and stable  Last Vitals:  Vitals Value Taken Time  BP 87/45 01/31/19 1208  Temp 36.3 C 01/31/19 1208  Pulse 74 01/31/19 1210  Resp 17 01/31/19 1210  SpO2 100 % 01/31/19 1210  Vitals shown include unvalidated device data.  Last Pain:  Vitals:   01/31/19 1208  TempSrc: Temporal  PainSc: 2          Complications: No apparent anesthesia complications

## 2019-01-31 NOTE — Anesthesia Postprocedure Evaluation (Signed)
Anesthesia Post Note  Patient: Paula Moses  Procedure(s) Performed: COLONOSCOPY WITH PROPOFOL (N/A )  Patient location during evaluation: Endoscopy Anesthesia Type: General Level of consciousness: awake and alert Pain management: pain level controlled Vital Signs Assessment: post-procedure vital signs reviewed and stable Respiratory status: spontaneous breathing, nonlabored ventilation, respiratory function stable and patient connected to nasal cannula oxygen Cardiovascular status: blood pressure returned to baseline and stable Postop Assessment: no apparent nausea or vomiting Anesthetic complications: no     Last Vitals:  Vitals:   01/31/19 1228 01/31/19 1238  BP: 114/74 (!) 105/58  Pulse: 62 (!) 57  Resp: (!) 23 13  Temp:    SpO2: 100% 100%    Last Pain:  Vitals:   01/31/19 1238  TempSrc:   PainSc: 0-No pain                 Martha Clan

## 2019-01-31 NOTE — H&P (Signed)
Paula Antigua, MD [address], [place], [place], [place], [address] [address], [place], [place], [place], [address] Phone: [phone]  Fax: [phone]  Primary Care Physician:  Paula Mc, MD   Pre-Procedure History & Physical: HPI:  Paula Moses is a 53 y.o. female is here for a colonoscopy.   Past Medical History:  Diagnosis Date  . Abnormal Pap smear of cervix   . Abnormal vaginal bleeding   . Anxiety   . Arthritis    neck and body  . Complication of anesthesia    IT TAKES ALOT OF ANESTHESIA TO KEEP PT SEDATED  . Cyst of vulva   . Dyspareunia   . External hemorrhoid   . Fibroids   . GERD (gastroesophageal reflux disease)   . Headache 2019   migraines, becoming more frequent  . High cholesterol   . History of UTI   . Hypertension   . Left-sided face pain   . Neck pain   . Paresthesia   . Rectocele   . Surgical menopause   . Trigeminal neuralgia   . Uterus prolapse     Past Surgical History:  Procedure Laterality Date  . AUGMENTATION MAMMAPLASTY Bilateral 2006  . BREAST ENHANCEMENT SURGERY    . CHOLECYSTECTOMY    . HIP ARTHROSCOPY Right 05/05/2017   Procedure: ARTHROSCOPY HIP;  Surgeon: Leim Fabry, MD;  Location: [facility] ORS;  Service: Orthopedics;  Laterality: Right;  . LAPAROSCOPY     X2  . TOTAL HIP ARTHROPLASTY Right 10/26/2017   Procedure: TOTAL HIP ARTHROPLASTY ANTERIOR APPROACH;  Surgeon: Paula Knows, MD;  Location: [facility] ORS;  Service: Orthopedics;  Laterality: Right;  . TOTAL HIP ARTHROPLASTY Left 03/30/2018   Procedure: LEFT TOTAL HIP ARTHROPLASTY ANTERIOR APPROACH;  Surgeon: Paula Rossetti, MD;  Location: [facility];  Service: Orthopedics;  Laterality: Left;  . tummy tuck     . VAGINAL HYSTERECTOMY     lahv bso    Prior to Admission medications   Medication Sig Start Date End Date Taking? Authorizing Provider  Azelastine HCl 137 MCG/SPRAY SOLN Place 2 sprays into both nostrils 2 (two) times daily. 01/18/19  Yes [provider]  Biotin w/ Vitamins C & E (HAIR/SKIN/NAILS PO) Take 2 tablets by mouth every evening.   Yes [provider]  bisacodyl (BISACODYL) 5 MG EC tablet Take 2 tablets (10mg ) by mouth the day before your procedure between 1pm-3pm. 01/23/19  Yes Paula Moses, Paula Bihari, MD  estradiol (ESTRACE) 2 MG tablet Take 2 mg by mouth at bedtime.    Yes [provider]  gabapentin (NEURONTIN) 300 MG capsule Take 2 capsules (600 mg total) by mouth 3 (three) times daily. 05/07/18  Yes Paula Rossetti, MD  meloxicam (MOBIC) 7.5 MG tablet Take 1 tablet (7.5 mg total) by mouth daily. 01/21/19  Yes Paula Mc, MD  montelukast (SINGULAIR) 10 MG tablet Take 10 mg by mouth at bedtime.   Yes [provider]  omeprazole (PRILOSEC OTC) 20 MG tablet Take 20 mg by mouth daily as needed (acid reflux).   Yes [provider]  polyethylene glycol (MIRALAX MIX-IN PAX) 17 g packet Take 17 g by mouth daily. 01/23/19  Yes Paula Manifold, MD  rizatriptan (MAXALT) 5 MG tablet Take 5 mg by mouth every 2 (two) hours as needed for migraine. 09/21/17  Yes [provider]  valsartan-hydrochlorothiazide (DIOVAN-HCT) 160-12.5 MG tablet Take 1 tablet by mouth once daily 01/21/19  Yes Paula Mc, MD  OVER THE COUNTER MEDICATION Take 3 tablets by mouth 2 (two) times daily. Fiber Well Gummies    [provider]    Allergies as of 01/24/2019 - Review Complete 01/23/2019  Allergen Reaction Noted  . Aspirin Shortness Of Breath and Other (See Comments) 01/26/2017  . Codeine Itching and Other (See Comments) 07/16/2013  . Percocet [oxycodone-acetaminophen] Itching and Other (See Comments) 07/16/2013  . Phenergan [promethazine hcl] Itching and Other (See Comments) 07/16/2013  . Tape Other (See Comments) 01/31/2018  . Tramadol  12/19/2018    Family History  Problem Relation Age of Onset  . Brain cancer Mother   . Dementia Father   . Breast cancer Neg Hx      Social History   Socioeconomic History  . Marital status: Married    Spouse name: Not on file  . Number of children: 4  . Years of education: BA  . Highest education level: Not on file  Occupational History  . Occupation: Cabin crew  Social Needs  . Financial resource strain: Not on file  . Food insecurity    Worry: Not on file    Inability: Not on file  . Transportation needs    Medical: Not on file    Non-medical: Not on file  Tobacco Use  . Smoking status: Never Smoker  . Smokeless tobacco: Never Used  Substance and Sexual Activity  . Alcohol use: Yes    Comment: none last 24hrs  . Drug use: No  . Sexual activity: Yes    Birth control/protection: Surgical  Lifestyle  . Physical activity    Days per week: Not on file    Minutes per session: Not on file  . Stress: To some extent  Relationships  . Social Herbalist on phone: Not on file    Gets together: Not on file    Attends religious service: Not on file    Active member of club or organization: Not on file    Attends meetings of clubs or organizations: Not on file    Relationship status: Not on file  . Intimate partner violence    Fear of current or ex partner: Not on file    Emotionally abused: Not on file    Physically abused: Not on file    Forced sexual activity: Not on file  Other Topics Concern  . Not on file  Social History Narrative   Patient lives at home at home with her Husband [name]) Patient works full time Cabin crew full time .   Education college.   Right handed.   Caffeine one cup of coffee daily.    Review of Systems: See HPI, otherwise negative ROS  Physical Exam: BP (!) 123/93   Pulse 64   Temp (!) 97 F (36.1 C) (Temporal)   Resp 20   Ht 5\' 5"  (1.651 m)   Wt 86.2 kg   SpO2 96%   BMI 31.62 kg/m  General:   Alert,  pleasant and cooperative in NAD Head:  Normocephalic and atraumatic. Neck:  Supple; no masses or thyromegaly. Lungs:  Clear throughout to auscultation,  normal respiratory effort.    Heart:  +S1, +S2, Regular rate and rhythm, No edema. Abdomen:  Soft, nontender and nondistended. Normal bowel sounds, without guarding, and without rebound.   Neurologic:  Alert and  oriented x4;  grossly normal neurologically.  Impression/Plan: [name] is here for a colonoscopy to be performed for average risk screening.  Risks, benefits, limitations, and alternatives  regarding  colonoscopy have been reviewed with the patient.  Questions have been answered.  All parties agreeable.   Paula Manifold, MD  01/31/2019, 11:27 AM

## 2019-01-31 NOTE — Op Note (Signed)
[facility] Patient Name: Paula Moses Procedure Date: 01/31/2019 11:26 AM MRN: [id number] Account #: [id number] Date of Birth: 10-05-1965 Admit Type: Outpatient Age: 53 Room: [facility] ENDO ROOM 2 Gender: Female Note Status: Finalized Procedure:             Colonoscopy Indications:           Screening for colorectal malignant neoplasm Providers:             Ruperto Kiernan B. Bonna Gains MD, MD Referring MD:          Deborra Medina, MD (Referring MD) Medicines:             Monitored Anesthesia Care Complications:         No immediate complications. Procedure:             Pre-Anesthesia Assessment:                        - ASA Grade Assessment: II - A patient with mild                         systemic disease.                        - Prior to the procedure, a History and Physical was                         performed, and patient medications, allergies and                         sensitivities were reviewed. The patient's tolerance                         of previous anesthesia was reviewed.                        - The risks and benefits of the procedure and the                         sedation options and risks were discussed with the                         patient. All questions were answered and informed                         consent was obtained.                        - Patient identification and proposed procedure were                         verified prior to the procedure by the physician, the                         nurse, the anesthesiologist, the anesthetist and the                         technician. The procedure was verified in the                         procedure room.  After obtaining informed consent, the colonoscope was                         passed under direct vision. Throughout the procedure,                         the patient's blood pressure, pulse, and oxygen                         saturations were monitored  continuously. The                         Colonoscope was introduced through the anus and                         advanced to the the cecum, identified by appendiceal                         orifice and ileocecal valve. The colonoscopy was                         performed with ease. The patient tolerated the                         procedure well. The quality of the bowel preparation                         was good. Findings:      Hemorrhoids were found on perianal exam.      A 3 mm polyp was found in the cecum. The polyp was sessile. The polyp       was removed with a cold biopsy forceps. Resection and retrieval were       complete.      A 5 mm polyp was found in the rectum. The polyp was sessile. The polyp       was removed with a cold snare. Resection and retrieval were complete.      The exam was otherwise without abnormality.      The rectum, sigmoid colon, descending colon, transverse colon, ascending       colon and cecum appeared normal.      Non-bleeding internal hemorrhoids were found during retroflexion. The       hemorrhoids were small. Impression:            - Hemorrhoids found on perianal exam.                        - One 3 mm polyp in the cecum, removed with a cold                         biopsy forceps. Resected and retrieved.                        - One 5 mm polyp in the rectum, removed with a cold                         snare. Resected and retrieved.                        -  The examination was otherwise normal.                        - The rectum, sigmoid colon, descending colon,                         transverse colon, ascending colon and cecum are normal.                        - Non-bleeding internal hemorrhoids. Recommendation:        - Discharge patient to home (with escort).                        - Advance diet as tolerated.                        - Continue present medications.                        - Await pathology results.                        -  Repeat colonoscopy date to be determined after                         pending pathology results are reviewed.                        - The findings and recommendations were discussed with                         the patient.                        - The findings and recommendations were discussed with                         the patient's family.                        - Return to primary care physician as previously                         scheduled.                        - High fiber diet. Procedure Code(s):     --- Professional ---                        [phone], Colonoscopy, flexible; with removal of                         tumor(s), polyp(s), or other lesion(s) by snare                         technique                        [id number], 59, Colonoscopy, flexible; with biopsy, single                         or multiple  Diagnosis Code(s):     --- Professional ---                        Z12.11, Encounter for screening for malignant neoplasm                         of colon                        K63.5, Polyp of colon                        K62.1, Rectal polyp CPT copyright 2019 American Medical Association. All rights reserved. The codes documented in this report are preliminary and upon coder review may  be revised to meet current compliance requirements.  [name], MD Margretta Sidle B. Keniesha Adderly MD, MD 01/31/2019 12:20:21 PM This report has been signed electronically. Number of Addenda: 0 Note Initiated On: 01/31/2019 11:26 AM Scope Withdrawal Time: 0 hours 16 minutes 1 second  Total Procedure Duration: 0 hours 22 minutes 30 seconds  Estimated Blood Loss:  Estimated blood loss: none.      [facility]

## 2019-01-31 NOTE — Anesthesia Post-op Follow-up Note (Signed)
Anesthesia QCDR form completed.        

## 2019-01-31 NOTE — Anesthesia Preprocedure Evaluation (Signed)
Anesthesia Evaluation  Patient identified by MRN, date of birth, ID band Patient awake    Reviewed: Allergy & Precautions, H&P , NPO status , Patient's Chart, lab work & pertinent test results  History of Anesthesia Complications Negative for: history of anesthetic complications  Airway Mallampati: II  TM Distance: >3 FB Neck ROM: full    Dental  (+) Chipped   Pulmonary neg pulmonary ROS, neg shortness of breath,           Cardiovascular hypertension,      Neuro/Psych  Headaches, Anxiety Migraine today  Neuromuscular disease    GI/Hepatic Neg liver ROS, GERD  Medicated and Controlled,  Endo/Other  negative endocrine ROS  Renal/GU negative Renal ROS  negative genitourinary   Musculoskeletal   Abdominal   Peds  Hematology negative hematology ROS (+)   Anesthesia Other Findings Past Medical History: No date: Abnormal Pap smear of cervix No date: Abnormal vaginal bleeding No date: Anxiety No date: Arthritis     Comment:  neck and body No date: Complication of anesthesia     Comment:  IT TAKES ALOT OF ANESTHESIA TO KEEP PT SEDATED No date: Cyst of vulva No date: Dyspareunia No date: External hemorrhoid No date: Fibroids No date: GERD (gastroesophageal reflux disease) 2019: Headache     Comment:  migraines, becoming more frequent No date: High cholesterol No date: History of UTI No date: Hypertension No date: Left-sided face pain No date: Neck pain No date: Paresthesia No date: Rectocele No date: Surgical menopause No date: Trigeminal neuralgia No date: Uterus prolapse  Past Surgical History: 2006: AUGMENTATION MAMMAPLASTY; Bilateral No date: BREAST ENHANCEMENT SURGERY No date: CHOLECYSTECTOMY 05/05/2017: HIP ARTHROSCOPY; Right     Comment:  Procedure: ARTHROSCOPY HIP;  Surgeon: Leim Fabry, MD;               Location: [facility];  Service: Orthopedics;  Laterality:               Right; No date:  LAPAROSCOPY     Comment:  X2 No date: tummy tuck  No date: VAGINAL HYSTERECTOMY     Comment:  lahv bso  BMI    Body Mass Index:  28.47 kg/m      Reproductive/Obstetrics negative OB ROS                             Anesthesia Physical  Anesthesia Plan  ASA: III  Anesthesia Plan: General   Post-op Pain Management:    Induction: Intravenous  PONV Risk Score and Plan: 3 and Propofol infusion and TIVA  Airway Management Planned: Natural Airway and Nasal Cannula  Additional Equipment:   Intra-op Plan:   Post-operative Plan:   Informed Consent: I have reviewed the patients History and Physical, chart, labs and discussed the procedure including the risks, benefits and alternatives for the proposed anesthesia with the patient or authorized representative who has indicated his/her understanding and acceptance.     Dental Advisory Given  Plan Discussed with: Anesthesiologist, CRNA and Surgeon  Anesthesia Plan Comments: (Patient reports no bleeding problems and no anticoagulant use.  Plan for spinal with backup GA  Patient consented for risks of anesthesia including but not limited to:  - adverse reactions to medications - risk of bleeding, infection, nerve damage and headache - risk of failed spinal - damage to teeth, lips or other oral mucosa - sore throat or hoarseness - Damage to heart, brain, lungs or loss of  life  Patient voiced understanding.)        Anesthesia Quick Evaluation

## 2019-02-01 ENCOUNTER — Encounter: Payer: Self-pay | Admitting: Gastroenterology

## 2019-02-01 LAB — SURGICAL PATHOLOGY

## 2019-02-06 ENCOUNTER — Encounter: Payer: Self-pay | Admitting: Gastroenterology

## 2019-02-25 ENCOUNTER — Ambulatory Visit (INDEPENDENT_AMBULATORY_CARE_PROVIDER_SITE_OTHER): Payer: PRIVATE HEALTH INSURANCE | Admitting: Orthopaedic Surgery

## 2019-02-25 ENCOUNTER — Ambulatory Visit: Payer: Self-pay

## 2019-02-25 ENCOUNTER — Other Ambulatory Visit: Payer: Self-pay

## 2019-02-25 ENCOUNTER — Encounter: Payer: Self-pay | Admitting: Orthopaedic Surgery

## 2019-02-25 DIAGNOSIS — Z96641 Presence of right artificial hip joint: Secondary | ICD-10-CM | POA: Diagnosis not present

## 2019-02-25 DIAGNOSIS — Z96642 Presence of left artificial hip joint: Secondary | ICD-10-CM | POA: Diagnosis not present

## 2019-02-25 NOTE — Progress Notes (Signed)
Office Visit Note   Patient: Paula Moses           Date of Birth: 1965-06-19           MRN: [id number] Visit Date: 02/25/2019              Requested by: [name], MD [address] [place],  [place] [address] PCP: [name], MD   Assessment & Plan: Visit Diagnoses:  1. Status post total replacement of left hip   2. History of total right hip replacement     Plan: I did go over her x-rays with her and show her that on the right side her acetabular component is certainly a little larger and this could have some overhang from the acetabular itself that is potentially irritating the iliopsoas tendon.  I explained to her that I have had this happen on joints that I have done.  I do feel it is reasonable to obtain an MRI of her right hip at this point to assess for any fluid collections or other things around the right hip prosthesis but may be causing her to have the symptoms.  I would like to see her back after this MRI.  All question concerns were answered and addressed.  She does wish to proceed with this as well given amount of pain that she is having and the detrimental effect this is having on her actives daily living and her quality of life.  Follow-Up Instructions: No follow-ups on file.  Follow-up will be after she has an MRI of her right hip.  Orders:  Orders Placed This Encounter  Procedures  . XR HIPS BILAT W OR W/O PELVIS 3-4 VIEWS   No orders of the defined types were placed in this encounter.     Procedures: No procedures performed   Clinical Data: No additional findings.   Subjective: Chief Complaint  Patient presents with  . Left Hip - Follow-up  . Right Hip - Pain  The patient is someone there we performed a left total hip arthroplasty on almost 11 months ago through direct anterior approach.  She actually had a right hip replaced through direct anterior approach 16 months ago in August 2019.  This was done in [facility].  She comes in today for evaluation treatment of pain and clicking as it involves her right hip.  She says her left hip is asymptomatic and has had no issues at all.  She says she feels a lot of achiness in her right hip and she gets grinding in that hip.  She says activities such as intercourse have become painful in terms of the clicking she gets in her right hip and not in her left.  She also reports some thigh pain on the right side and is a completely asymptomatic on the left side.  HPI  Review of Systems She currently denies any headache, chest pain, shortness of breath, fever, chills, nausea, vomiting  Objective: Vital Signs: There were no vitals taken for this visit.  Physical Exam She is alert and oriented x3 and in no acute distress.  She does not walk with any significant limp. Ortho Exam On examination laying supine her leg lengths are equal.  Both hips move smoothly and fluidly.  I could not palpate any type of clicking in the right hip but she certainly had pain with the extremes of rotation and flexion on the right hip that did not happen  on the left hip during the exam. Specialty Comments:  No specialty comments available.  Imaging: XR HIPS BILAT W OR W/O PELVIS 3-4 VIEWS  Result Date: 02/25/2019 An AP pelvis and lateral both hips shows bilateral total hip arthroplasties.  There is no gross complicating features with no evidence of loosening of either hip.  There is obvious different in components in terms of the manufacturer of the components.  The right hip acetabular component is larger than the left but the hip ball is smaller.  There is more overhang from the acetabulum on the right as stepping up onto the left.    PMFS History: Patient Active Problem List   Diagnosis Date Noted  . History of total right hip replacement 02/25/2019  . Screening for colon cancer   . Rectal polyp   . Polyp of colon   . Osteoarthritis of fingers of both hands 12/21/2018  .  Constipation 12/12/2018  . 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 Unilateral primary osteoarthritis, left hip 01/22/2018  . Arthritis of left hip 12/20/2017  . Degenerative disc disease, lumbar 12/20/2017  . Status post total hip replacement, right 10/26/2017  . Femoroacetabular impingement 05/05/2017  . Surgical menopause 01/26/2017  . Status post vaginal hysterectomy BSO 01/26/2017  . Hypertension   . High cholesterol   . Fibroids   . Trigeminal neuralgia   . Left-sided face pain   . Neck pain on left side 07/16/2013  . Left facial pain 07/16/2013  . Paresthesia 07/16/2013   Past Medical History:  Diagnosis Date  . Abnormal Pap smear of cervix   . Abnormal vaginal bleeding   . Anxiety   . Arthritis    neck and body  . Complication of anesthesia    IT TAKES ALOT OF ANESTHESIA TO KEEP PT SEDATED  . Cyst of vulva   . Dyspareunia   . External hemorrhoid   . Fibroids   . GERD (gastroesophageal reflux disease)   . Headache 2019   migraines, becoming more frequent  . High cholesterol   . History of UTI   . Hypertension   . Left-sided face pain   . Neck pain   . Paresthesia   . Rectocele   . Surgical menopause   . Trigeminal neuralgia   . Uterus prolapse     Family History  Problem Relation Age of Onset  . Brain cancer Mother   . Dementia Father   . Breast cancer Neg Hx     Past Surgical History:  Procedure Laterality Date  . AUGMENTATION MAMMAPLASTY Bilateral 2006  . BREAST ENHANCEMENT SURGERY    . CHOLECYSTECTOMY    . COLONOSCOPY WITH PROPOFOL N/A 01/31/2019   Procedure: COLONOSCOPY WITH PROPOFOL;  Surgeon: [name], MD;  Location: [facility];  Service: Endoscopy;  Laterality: N/A;  2 day prep   . HIP ARTHROSCOPY Right 05/05/2017   Procedure: ARTHROSCOPY HIP;  Surgeon: Leim Fabry, MD;  Location: [facility];  Service: Orthopedics;  Laterality: Right;  . LAPAROSCOPY     X2  . TOTAL HIP ARTHROPLASTY Right 10/26/2017   Procedure:  TOTAL HIP ARTHROPLASTY ANTERIOR APPROACH;  Surgeon: [name], MD;  Location: [facility];  Service: Orthopedics;  Laterality: Right;  . TOTAL HIP ARTHROPLASTY Left 03/30/2018   Procedure: LEFT TOTAL HIP ARTHROPLASTY ANTERIOR APPROACH;  Surgeon: [name], MD;  Location: [facility];  Service: Orthopedics;  Laterality: Left;  . tummy tuck     . VAGINAL HYSTERECTOMY  lahv bso   Social History   Occupational History  . Occupation: realtor  Tobacco Use  . Smoking status: Never Smoker  . Smokeless tobacco: Never Used  Substance and Sexual Activity  . Alcohol use: Yes    Comment: none last 24hrs  . Drug use: No  . Sexual activity: Yes    Birth control/protection: Surgical

## 2019-02-28 ENCOUNTER — Other Ambulatory Visit: Payer: Self-pay

## 2019-02-28 ENCOUNTER — Telehealth: Payer: Self-pay | Admitting: Orthopaedic Surgery

## 2019-02-28 ENCOUNTER — Ambulatory Visit (INDEPENDENT_AMBULATORY_CARE_PROVIDER_SITE_OTHER): Payer: PRIVATE HEALTH INSURANCE | Admitting: Internal Medicine

## 2019-02-28 ENCOUNTER — Encounter: Payer: Self-pay | Admitting: Internal Medicine

## 2019-02-28 VITALS — Ht 65.0 in | Wt 190.0 lb

## 2019-02-28 DIAGNOSIS — M5416 Radiculopathy, lumbar region: Secondary | ICD-10-CM | POA: Diagnosis not present

## 2019-02-28 DIAGNOSIS — E669 Obesity, unspecified: Secondary | ICD-10-CM | POA: Insufficient documentation

## 2019-02-28 DIAGNOSIS — Z6833 Body mass index (BMI) 33.0-33.9, adult: Secondary | ICD-10-CM

## 2019-02-28 DIAGNOSIS — I10 Essential (primary) hypertension: Secondary | ICD-10-CM | POA: Diagnosis not present

## 2019-02-28 DIAGNOSIS — M48061 Spinal stenosis, lumbar region without neurogenic claudication: Secondary | ICD-10-CM | POA: Insufficient documentation

## 2019-02-28 DIAGNOSIS — E6609 Other obesity due to excess calories: Secondary | ICD-10-CM

## 2019-02-28 DIAGNOSIS — E663 Overweight: Secondary | ICD-10-CM | POA: Insufficient documentation

## 2019-02-28 NOTE — Progress Notes (Signed)
Virtual Visit via DOXY,ME  This visit type was conducted due to national recommendations for restrictions regarding the COVID-19 pandemic (e.g. social distancing).  This format is felt to be most appropriate for this patient at this time.  All issues noted in this document were discussed and addressed.  No physical exam was performed (except for noted visual exam findings with Video Visits).   I connected with@ on 02/28/19 at  9:30 AM EST by a video enabled telemedicine application and verified that I am speaking with the correct person using two identifiers. Location patient: home Location provider: work or home office Persons participating in the virtual visit: patient, provider  I discussed the limitations, risks, security and privacy concerns of performing an evaluation and management service by telephone and the availability of in person appointments. I also discussed with the patient that there may be a patient responsible charge related to this service. The patient expressed understanding and agreed to proceed.  Reason for visit: follow up on multiple issuesiet .    HPI:  53 yr old female with hypertension , oa , lumbar spinal stenosis s/p bilateral hip replacement  In constant pain from hips and now lower back pain radiating to both legs. Seeing orthopedics for hip pain,  MRI ordered.   Afraid to exercise due to pain .  Weight gain.  190 lbs  .  Has had some success with the intermittent fasting diet and KETO diet but gains weight as soon as she stops.  Has used  phentermine in the past with excellent results, wants to resume   MRI hips scheduled for Saturday . Wants to see [facility] for another lumbar ESI.  Last MRI was June 2019 [facility]  Last ESI was Oct 2019.  Patient is taking her medications as prescribed and notes no adverse effects.  Home BP readings have been done about once per week and are  Generally > 130/80 .  She is avoiding added salt in her diet   HAD COLONOSCOPY N  November BY [facility]  ORTHO FOLLOW UP ON LEFT HIP DIRECT ANTERIOR  REPLACEMENT FOLLOWING A RIGH HIP REPLACEMEMENT , BOTH IN 2019     ROS: See pertinent positives and negatives per HPI.  Past Medical History:  Diagnosis Date  . Abnormal Pap smear of cervix   . Abnormal vaginal bleeding   . Anxiety   . Arthritis    neck and body  . Complication of anesthesia    IT TAKES ALOT OF ANESTHESIA TO KEEP PT SEDATED  . Cyst of vulva   . Dyspareunia   . External hemorrhoid   . Fibroids   . GERD (gastroesophageal reflux disease)   . Headache 2019   migraines, becoming more frequent  . High cholesterol   . History of UTI   . Hypertension   . Left-sided face pain   . Neck pain   . Paresthesia   . Rectocele   . Surgical menopause   . Trigeminal neuralgia   . Uterus prolapse     Past Surgical History:  Procedure Laterality Date  . AUGMENTATION MAMMAPLASTY Bilateral 2006  . BREAST ENHANCEMENT SURGERY    . CHOLECYSTECTOMY    . COLONOSCOPY WITH PROPOFOL N/A 01/31/2019   Procedure: COLONOSCOPY WITH PROPOFOL;  Surgeon: [name], MD;  Location: [facility];  Service: Endoscopy;  Laterality: N/A;  2 day prep   . HIP ARTHROSCOPY Right 05/05/2017   Procedure: ARTHROSCOPY HIP;  Surgeon: Leim Fabry, MD;  Location: [facility];  Service: Orthopedics;  Laterality: Right;  . LAPAROSCOPY     X2  . TOTAL HIP ARTHROPLASTY Right 10/26/2017   Procedure: TOTAL HIP ARTHROPLASTY ANTERIOR APPROACH;  Surgeon: [name], MD;  Location: [facility];  Service: Orthopedics;  Laterality: Right;  . TOTAL HIP ARTHROPLASTY Left 03/30/2018   Procedure: LEFT TOTAL HIP ARTHROPLASTY ANTERIOR APPROACH;  Surgeon: [name], MD;  Location: [facility];  Service: Orthopedics;  Laterality: Left;  . tummy tuck     . VAGINAL HYSTERECTOMY     lahv bso    Family History  Problem Relation Age of Onset  . Brain cancer Mother   . Dementia Father   . Breast cancer Neg Hx     SOCIAL HX:  reports  that she has never smoked. She has never used smokeless tobacco. She reports current alcohol use. She reports that she does not use drugs.  Current Outpatient Medications:  .  Azelastine HCl 137 MCG/SPRAY SOLN, Place 2 sprays into both nostrils 2 (two) times daily., Disp: , Rfl:  .  Biotin w/ Vitamins C & E (HAIR/SKIN/NAILS PO), Take 2 tablets by mouth every evening., Disp: , Rfl:  .  estradiol (ESTRACE) 2 MG tablet, Take 2 mg by mouth at bedtime. , Disp: , Rfl:  .  fluticasone (FLONASE) 50 MCG/ACT nasal spray, SPRAY TWICE IN EACH NOSTRIL ONCE DAILY, Disp: , Rfl:  .  gabapentin (NEURONTIN) 300 MG capsule, Take 2 capsules (600 mg total) by mouth 3 (three) times daily., Disp: 3090 capsule, Rfl: 3 .  meloxicam (MOBIC) 7.5 MG tablet, Take 1 tablet (7.5 mg total) by mouth daily., Disp: 30 tablet, Rfl: 0 .  montelukast (SINGULAIR) 10 MG tablet, Take 10 mg by mouth at bedtime., Disp: , Rfl:  .  omeprazole (PRILOSEC OTC) 20 MG tablet, Take 20 mg by mouth daily as needed (acid reflux)., Disp: , Rfl:  .  OVER THE COUNTER MEDICATION, Take 3 tablets by mouth 2 (two) times daily. Fiber Well Gummies, Disp: , Rfl:  .  rizatriptan (MAXALT) 5 MG tablet, Take 5 mg by mouth every 2 (two) hours as needed for migraine., Disp: , Rfl: 0 .  valsartan-hydrochlorothiazide (DIOVAN-HCT) 160-12.5 MG tablet, Take 1 tablet by mouth once daily, Disp: 30 tablet, Rfl: 5  EXAM:  VITALS per patient if applicable:  GENERAL: alert, oriented, appears well and in no acute distress  HEENT: atraumatic, conjunttiva clear, no obvious abnormalities on inspection of external nose and ears  NECK: normal movements of the head and neck  LUNGS: on inspection no signs of respiratory distress, breathing rate appears normal, no obvious gross SOB, gasping or wheezing  CV: no obvious cyanosis  MS: moves all visible extremities without noticeable abnormality  PSYCH/NEURO: pleasant and cooperative, no obvious depression or anxiety, speech  and thought processing grossly intact  ASSESSMENT AND PLAN:  Discussed the following assessment and plan:  Lumbar radiculopathy, acute - Plan: MR Lumbar Spine Wo Contrast, amb ref to [facility]  Spinal stenosis of lumbar region without neurogenic claudication - Plan: MR Lumbar Spine Wo Contrast  Essential hypertension  Class 1 obesity due to excess calories without serious comorbidity with body mass index (BMI) of 33.0 to 33.9 in adult  Hypertension Home readings were done but not available for review at today's app.t  Goal is 120/70 to 130/80  Lumbar radiculopathy, acute MRI lumbar spine ordered in anticipation of referral for ESI  Obesity She has had difficulty losing weight due to increased appetite and is requesting a repeat  trial of  Phentermine.  She is aware of the possible side effects and risks and understands that    The medication will be discontinued if she has not lost 5% of her body weight over the next 3 months, which , based on today's weight is 10 lbs.    I discussed the assessment and treatment plan with the patient. The patient was provided an opportunity to ask questions and all were answered. The patient agreed with the plan and demonstrated an understanding of the instructions.   The patient was advised to call back or seek an in-person evaluation if the symptoms worsen or if the condition fails to improve as anticipated.   I provided  25 minutes of non-face-to-face time during this encounter reviewing patient's current problems and past procedures/imaging studies, providing counseling on the above mentioned problems , and coordination  of care .  [name], MD

## 2019-02-28 NOTE — Assessment & Plan Note (Signed)
She has had difficulty losing weight due to increased appetite and is requesting a repeat trial of  Phentermine.  She is aware of the possible side effects and risks and understands that    The medication will be discontinued if she has not lost 5% of her body weight over the next 3 months, which , based on today's weight is 10 lbs.

## 2019-02-28 NOTE — Telephone Encounter (Signed)
Patient called and stated need a final inj for spinal stenosis and you need this done asap.[facility] is doing inj and you need Dr. Ninfa Linden to auth procedure.  [phone] Dr Sharlet Salina  Please call facility to [facility].

## 2019-02-28 NOTE — Telephone Encounter (Signed)
That will be fine by me to have this authorized for her.

## 2019-02-28 NOTE — Telephone Encounter (Signed)
Please advise 

## 2019-02-28 NOTE — Assessment & Plan Note (Signed)
MRI lumbar spine ordered in anticipation of referral for [facility]

## 2019-02-28 NOTE — Assessment & Plan Note (Signed)
Home readings were done but not available for review at today's app.t  Goal is 120/70 to 130/80

## 2019-03-01 NOTE — Telephone Encounter (Signed)
Left message with clinic. Asked for return call if order was needed or just a verbal.

## 2019-03-02 ENCOUNTER — Other Ambulatory Visit: Payer: PRIVATE HEALTH INSURANCE

## 2019-03-02 MED ORDER — PHENTERMINE HCL 37.5 MG PO TABS: 37.5000 mg | ORAL_TABLET | Freq: Every day | ORAL | 0 refills | Status: DC

## 2019-03-05 ENCOUNTER — Ambulatory Visit: Payer: PRIVATE HEALTH INSURANCE | Admitting: Orthopaedic Surgery

## 2019-03-06 ENCOUNTER — Telehealth: Payer: Self-pay | Admitting: Orthopaedic Surgery

## 2019-03-06 NOTE — Telephone Encounter (Signed)
Message Sent in Error

## 2019-03-09 ENCOUNTER — Other Ambulatory Visit: Payer: PRIVATE HEALTH INSURANCE

## 2019-03-11 ENCOUNTER — Telehealth: Payer: Self-pay | Admitting: Orthopedic Surgery

## 2019-03-11 NOTE — Telephone Encounter (Signed)
It was very odd with my peer-to-peer review.  I talk with the physician but they were not approving it without somehow having to check with the facility where the study was being done.  I was actually seeing her for her hip and was doing a peer to peer review for what I thought was a MRI to assess her right hip replacement.

## 2019-03-11 NOTE — Telephone Encounter (Signed)
Remind me what was the end result on that peer to peer you did Wednesday

## 2019-03-11 NOTE — Telephone Encounter (Signed)
Patient aware of the below message  

## 2019-03-11 NOTE — Telephone Encounter (Signed)
Summerlyn would like to know if the peer to peer has been scheduled, states she is pending approval for an injection for spinal stenosis.

## 2019-03-18 ENCOUNTER — Ambulatory Visit
Admission: RE | Admit: 2019-03-18 | Discharge: 2019-03-18 | Disposition: A | Discharge status: Home / Self Care | Payer: 59 | Source: Ambulatory Visit | Attending: Physician Assistant | Admitting: Physician Assistant

## 2019-03-18 ENCOUNTER — Other Ambulatory Visit: Payer: Self-pay | Admitting: Physician Assistant

## 2019-03-18 ENCOUNTER — Other Ambulatory Visit: Payer: Self-pay

## 2019-03-18 ENCOUNTER — Telehealth: Payer: Self-pay | Admitting: Internal Medicine

## 2019-03-18 DIAGNOSIS — R1031 Right lower quadrant pain: Secondary | ICD-10-CM | POA: Diagnosis present

## 2019-03-18 MED ORDER — IOHEXOL 300 MG/ML  SOLN
100.0000 mL | Freq: Once | INTRAMUSCULAR | Status: AC | PRN
  Administered 2019-03-18: 15:00:00 100 mL via INTRAVENOUS

## 2019-03-18 NOTE — Telephone Encounter (Signed)
Patient Stated times 2 days ago she started having some light bleeding right sided flank pain that has moved to left side patient stated crying its all over my lower abdomen feels lie labor pains comes and goes but most of the time is a ten advised patient needs to be evaluated at ER if pain rated at 10, patient agreed but is concerned about having to wait at ER.

## 2019-03-18 NOTE — Telephone Encounter (Signed)
She may have a kidney stone , I cannot send in an rx for narcotics given her allergies and the fact that  that I have not seen her.   She wil have to go to ER  Where they can give her IV pain medicatio nand obtain a CT

## 2019-03-18 NOTE — Telephone Encounter (Signed)
Pt is in a lot of pain in her lower abdomen-right side. Pt also has blood in her urine. Please call pt ASAP to advise. [phone]

## 2019-03-18 NOTE — Telephone Encounter (Signed)
Patient was seen at [facility]. FYI

## 2019-03-27 ENCOUNTER — Ambulatory Visit: Payer: PRIVATE HEALTH INSURANCE

## 2019-04-15 IMAGING — MR MR HIP*L* W/O CM
6 series · 34 of 40 positions shown · non-contrast
Comparison: Radiographs 12/31/2017

CLINICAL DATA: Left hip pain.

EXAM:
MR OF THE LEFT HIP WITHOUT CONTRAST
TECHNIQUE: Multiplanar, multisequence MR imaging was performed. No intravenous
contrast was administered.

[Series 3: T1 · coronal · 4.0mm · 1.56mm/px · 2 of 37 slices shown]
[im 1/37]
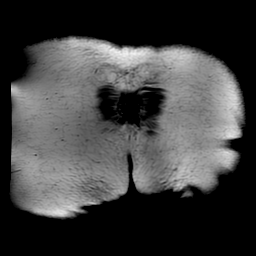
[im 6/37]
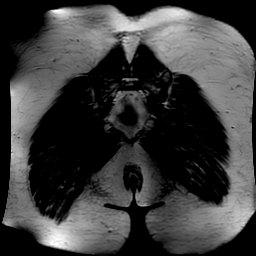

[Series 4: T2 fat-sat · coronal · 4.0mm · 1.56mm/px · 7 of 37 slices shown (1 of 3)]
[im 1/37]
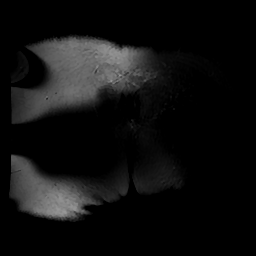
[im 7/37]
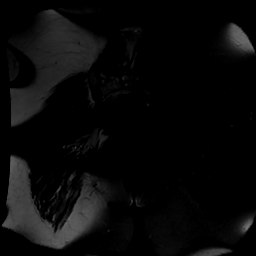
[im 13/37]
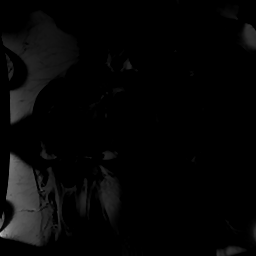
[im 19/37]
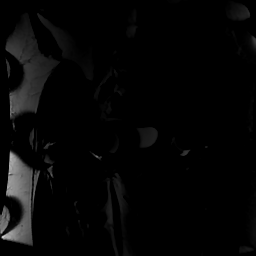
[im 25/37]
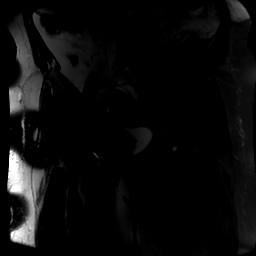
[im 31/37]
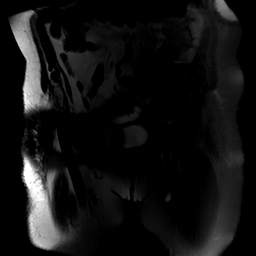
[im 37/37]
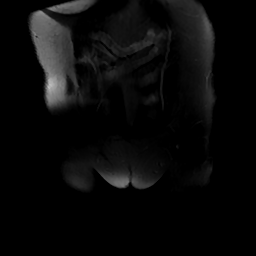

[Series 6: T2 fat-sat · axial · 4.0mm · 0.43mm/px · z∈[-166,-6]mm · 6 of 33 slices shown (2 of 3)]
[im 1/33]
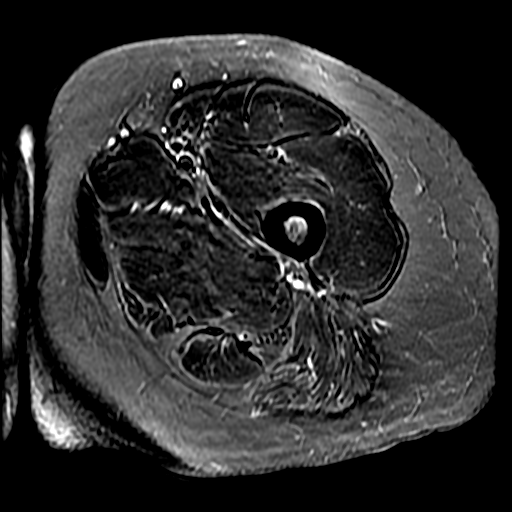
[im 7/33]
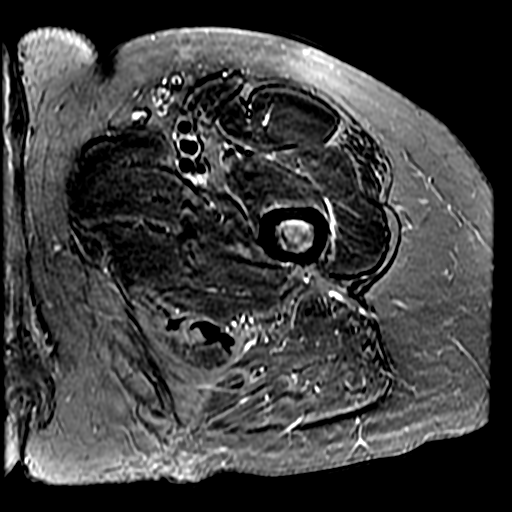
[im 13/33]
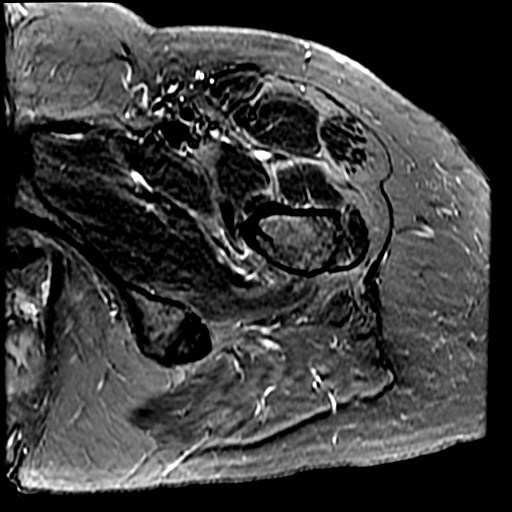
[im 20/33]
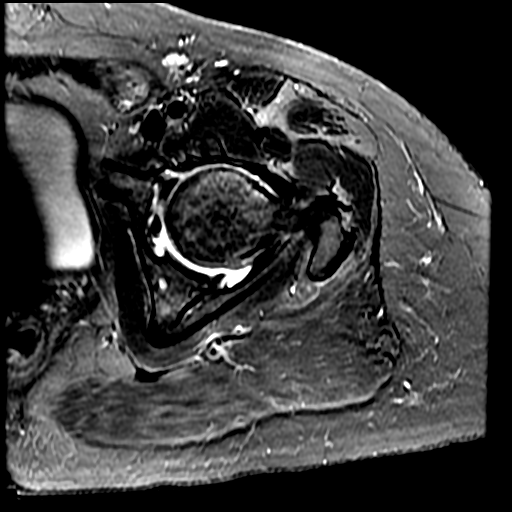
[im 26/33]
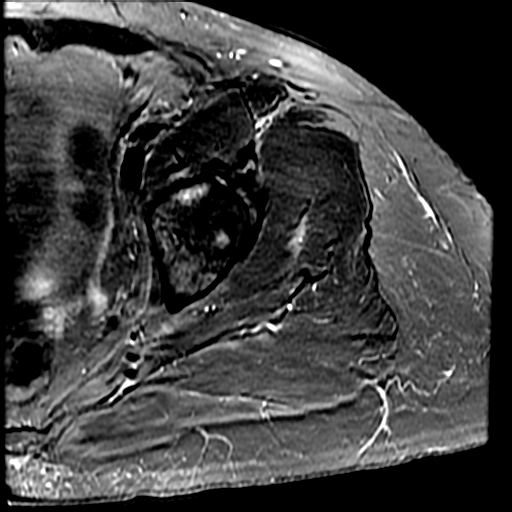
[im 33/33]
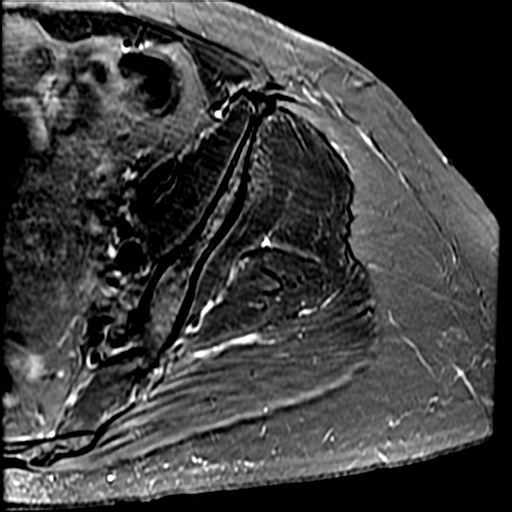

[Series 8: PD fat-sat · sagittal · 4.0mm · 0.43mm/px · 7 of 36 slices shown (1 of 2)]
[im 1/36]
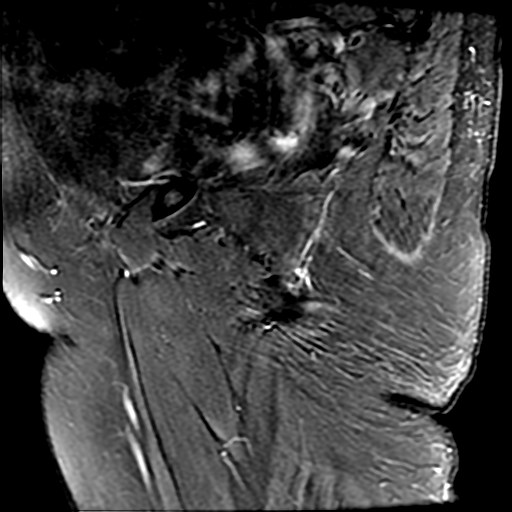
[im 6/36]
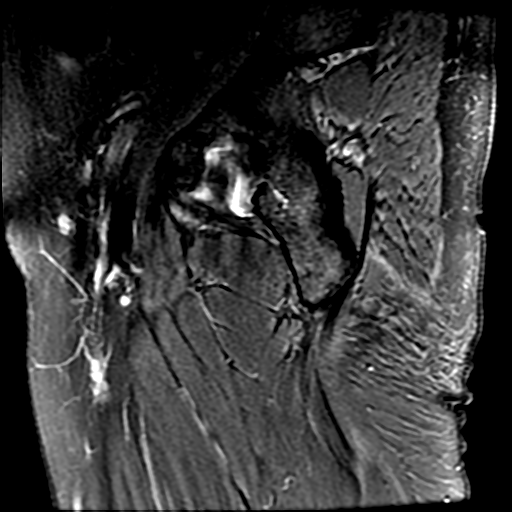
[im 12/36]
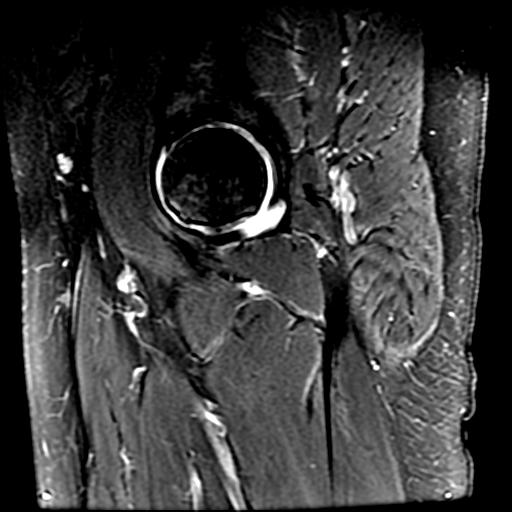
[im 18/36]
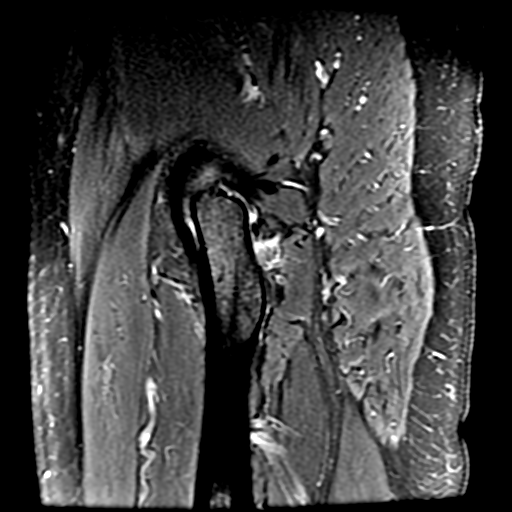
[im 24/36]
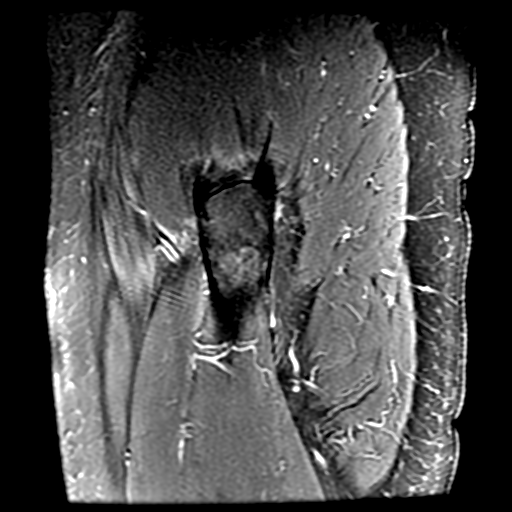
[im 30/36]
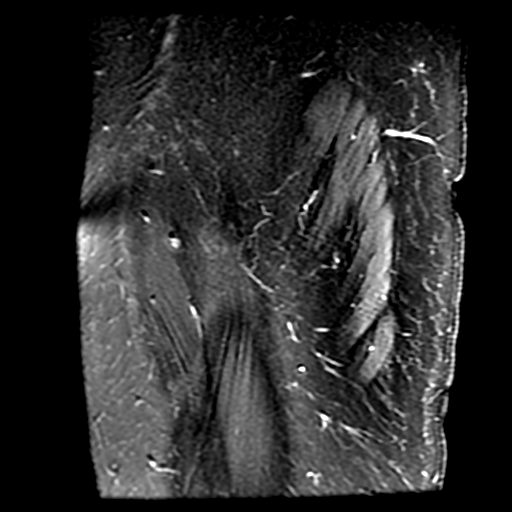
[im 36/36]
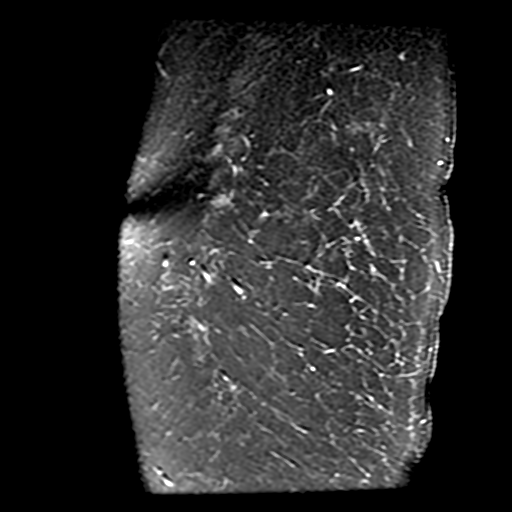

[Series 10: PD fat-sat · coronal · 4.0mm · 0.86mm/px · 5 of 27 slices shown (2 of 2)]
[im 1/27]
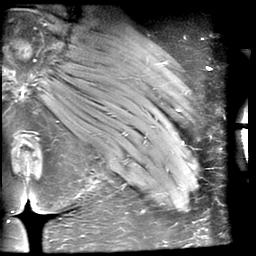
[im 7/27]
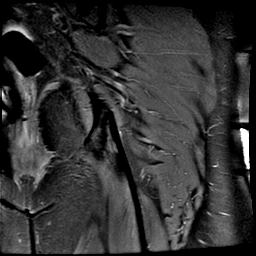
[im 14/27]
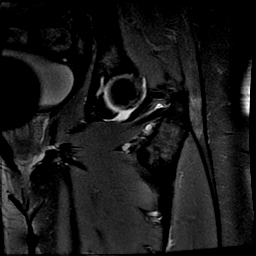
[im 20/27]
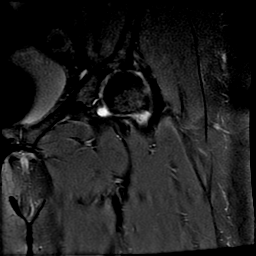
[im 27/27]
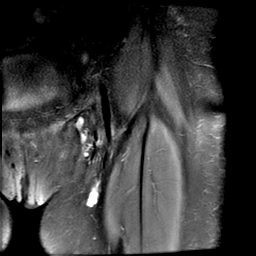

[T2 fat-sat · coronal · 4.0mm · 1.56mm/px · 7 of 37 slices shown (3 of 3)]
[im 1/37]
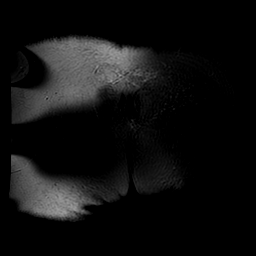
[im 7/37]
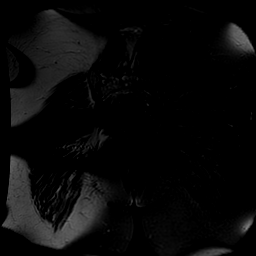
[im 13/37]
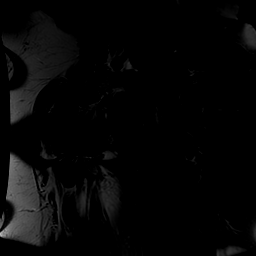
[im 19/37]
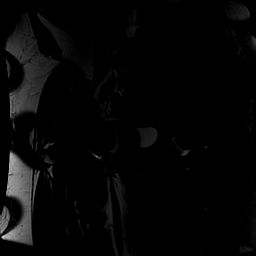
[im 25/37]
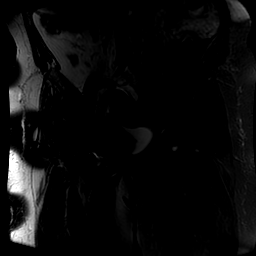
[im 31/37]
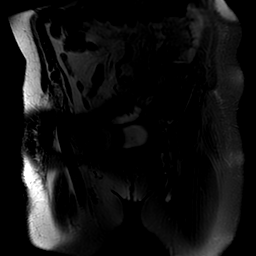
[im 37/37]
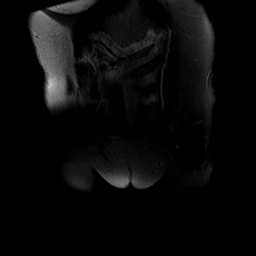

[34 of 40 positions shown; findings below may reference images not displayed]

FINDINGS: A right hip prosthesis is noted with significant artifact

The left hip is normally located. Moderate to advanced degenerative
changes for age with joint space narrowing, osteophytic spurring and
subchondral edema. There is also a small joint effusion. Moderate
cartilage loss along the anterior superior region of the joint. I do
not see an obvious large labral tear. No stress fracture or AVN.

The pubic symphysis and SI joints are intact. No pelvic fractures or
bone lesions.

No significant intrapelvic abnormalities. No left inguinal hernia or
mass. Small scattered lymph nodes are noted.

The surrounding left hip and pelvic musculature are unremarkable. No
obvious muscle tear or hematoma. Mild hamstring tendinopathy but no
tear/rupture.
IMPRESSION: 1. Moderate to advanced left hip joint degenerative changes but no
stress fracture or AVN.
2. Intact bony pelvis.  No pelvic fractures.
3. No acute muscle injury or hematoma.
4. No significant intrapelvic abnormalities.

## 2019-05-01 ENCOUNTER — Other Ambulatory Visit: Payer: Self-pay | Admitting: Internal Medicine

## 2019-06-04 ENCOUNTER — Other Ambulatory Visit: Payer: Self-pay | Admitting: Internal Medicine

## 2019-06-12 ENCOUNTER — Telehealth: Payer: Self-pay

## 2019-06-12 NOTE — Telephone Encounter (Signed)
Close  

## 2019-06-18 ENCOUNTER — Telehealth: Payer: Self-pay | Admitting: Internal Medicine

## 2019-06-18 NOTE — Telephone Encounter (Signed)
Pt is scheduled for a face to face tomorrow at 4:30 with Dr. Derrel Nip. This appointment was approved by Dr. Derrel Nip since it was for hemorrhoids.

## 2019-06-18 NOTE — Telephone Encounter (Signed)
Patient really needs to come into office to see Dr. Derrel Nip. No where else to schedule appt. Can patient come into office? Painful hemorrhoids internal and external, off and on blood in stool. She really needs a face to face.

## 2019-06-19 ENCOUNTER — Other Ambulatory Visit: Payer: Self-pay

## 2019-06-19 ENCOUNTER — Encounter: Payer: Self-pay | Admitting: Internal Medicine

## 2019-06-19 ENCOUNTER — Ambulatory Visit: Payer: 59 | Admitting: Internal Medicine

## 2019-06-19 VITALS — BP 138/88 | HR 76 | Temp 97.7°F | Resp 15 | Ht 65.0 in | Wt 186.4 lb

## 2019-06-19 DIAGNOSIS — K648 Other hemorrhoids: Secondary | ICD-10-CM

## 2019-06-19 DIAGNOSIS — K649 Unspecified hemorrhoids: Secondary | ICD-10-CM

## 2019-06-19 MED ORDER — HYDROCORT-PRAMOXINE (PERIANAL) 1-1 % EX FOAM: 1.0000 | Freq: Two times a day (BID) | CUTANEOUS | 1 refills | Status: DC

## 2019-06-19 MED ORDER — DIBUCAINE (PERIANAL) 1 % EX OINT: 1.0000 "application " | TOPICAL_OINTMENT | CUTANEOUS | 1 refills | Status: DC | PRN

## 2019-06-19 MED ORDER — DILTIAZEM GEL 2 %: 1.0000 "application " | Freq: Two times a day (BID) | CUTANEOUS | 2 refills | Status: DC

## 2019-06-19 NOTE — Progress Notes (Signed)
Subjective:  Patient ID: Paula Moses, female    DOB: Oct 10, 1965  Age: 54 y.o. MRN: [id number]  CC: The primary encounter diagnosis was Acute hemorrhoid. Diagnoses of Bleeding internal hemorrhoids and Internal bleeding hemorrhoids were also pertinent to this visit.  HPI [name] presents for eval and treatment of inflamed hemorrhoids.  Small internal hemorrhoids were noted on Nov 2020 colonoscopy.    Treated for sigmoid diverticulitis in January , CT confirmed.  required 2 rounds of antibiotics .  Has had enflamed hemorrhoids  since January . Has tried Prep H cream,  Lidocaine wipes , witch Hazel , sat on ice packs  .  Waking up with pain .  Constipation has been aggravated by phentermine.  Using metamucil and dulcolax on a daily basis which has helped with the constipation somewhat     Outpatient Medications Prior to Visit  Medication Sig Dispense Refill  . Azelastine HCl 137 MCG/SPRAY SOLN Place 2 sprays into both nostrils 2 (two) times daily.    . Biotin w/ Vitamins C & E (HAIR/SKIN/NAILS PO) Take 2 tablets by mouth every evening.    Marland Kitchen estradiol (ESTRACE) 2 MG tablet Take 2 mg by mouth at bedtime.     . fluticasone (FLONASE) 50 MCG/ACT nasal spray SPRAY TWICE IN EACH NOSTRIL ONCE DAILY    . gabapentin (NEURONTIN) 300 MG capsule Take 2 capsules (600 mg total) by mouth 3 (three) times daily. 3090 capsule 3  . meloxicam (MOBIC) 7.5 MG tablet Take 1 tablet by mouth once daily 30 tablet 5  . montelukast (SINGULAIR) 10 MG tablet Take 10 mg by mouth at bedtime.    Marland Kitchen omeprazole (PRILOSEC OTC) 20 MG tablet Take 20 mg by mouth daily as needed (acid reflux).    Marland Kitchen OVER THE COUNTER MEDICATION Take 3 tablets by mouth 2 (two) times daily. Fiber Well Gummies    . phentermine (ADIPEX-P) 37.5 MG tablet Take 1 tablet (37.5 mg total) by mouth daily before breakfast. 90 tablet 0  . rizatriptan (MAXALT) 5 MG tablet Take 5 mg by mouth every 2 (two) hours as needed for migraine.  0  .  valsartan-hydrochlorothiazide (DIOVAN-HCT) 160-12.5 MG tablet Take 1 tablet by mouth once daily 90 tablet 0   No facility-administered medications prior to visit.    Review of Systems;  Patient denies headache, fevers, malaise, unintentional weight loss, skin rash, eye pain, sinus congestion and sinus pain, sore throat, dysphagia,  hemoptysis , cough, dyspnea, wheezing, chest pain, palpitations, orthopnea, edema, abdominal pain, nausea, melena, diarrhea, constipation, flank pain, dysuria, hematuria, urinary  Frequency, nocturia, numbness, tingling, seizures,  Focal weakness, Loss of consciousness,  Tremor, insomnia, depression, anxiety, and suicidal ideation.      Objective:  BP 138/88 (BP Location: Left Arm, Patient Position: Sitting, Cuff Size: Normal)   Pulse 76   Temp 97.7 F (36.5 C) (Temporal)   Resp 15   Ht 5\' 5"  (1.651 m)   Wt 186 lb 6.4 oz (84.6 kg)   SpO2 99%   BMI 31.02 kg/m   BP Readings from Last 3 Encounters:  06/19/19 138/88  01/31/19 (!) 105/58  01/23/19 (!) 146/84    Wt Readings from Last 3 Encounters:  06/19/19 186 lb 6.4 oz (84.6 kg)  02/28/19 190 lb (86.2 kg)  01/31/19 190 lb (86.2 kg)    General appearance: alert, cooperative and appears stated age Ears: normal TM's and external ear canals both ears Throat: lips, mucosa, and tongue normal; teeth and gums normal Neck:  no adenopathy, no carotid bruit, supple, symmetrical, trachea midline and thyroid not enlarged, symmetric, no tenderness/mass/nodules Back: symmetric, no curvature. ROM normal. No CVA tenderness. Lungs: clear to auscultation bilaterally Heart: regular rate and rhythm, S1, S2 normal, no murmur, click, rub or gallop Abdomen: soft, non-tender; bowel sounds normal; no masses,  no organomegalyn Rectal:  External non thrombosed hemorrhoids, internal exam limited by pain with penetration,  Internal hemorrhoids swollen  Pulses: 2+ and symmetric Skin: Skin color, texture, turgor normal. No rashes  or lesions Lymph nodes: Cervical, supraclavicular, and axillary nodes normal.  No results found for: HGBA1C  Lab Results  Component Value Date   CREATININE 0.83 01/18/2019   CREATININE 0.77 03/31/2018   CREATININE 0.80 03/28/2018    Lab Results  Component Value Date   WBC 6.7 01/18/2019   HGB 13.3 01/18/2019   HCT 39.1 01/18/2019   PLT 266.0 01/18/2019   GLUCOSE 89 01/18/2019   CHOL 252 (H) 01/18/2019   TRIG 238.0 (H) 01/18/2019   HDL 56.90 01/18/2019   LDLDIRECT 145.0 01/18/2019   ALT 13 01/18/2019   AST 15 01/18/2019   NA 137 01/18/2019   K 4.4 01/18/2019   CL 99 01/18/2019   CREATININE 0.83 01/18/2019   BUN 12 01/18/2019   CO2 26 01/18/2019   TSH 3.49 01/18/2019   INR 0.96 10/25/2017    CT ABDOMEN PELVIS W CONTRAST  Addendum Date: 03/18/2019   ADDENDUM REPORT: 03/18/2019 15:58 ADDENDUM: Study discussed by telephone with Dr. Paulita Cradle on 03/18/2019 at 1553 hours. Electronically Signed   By: Genevie Ann M.D.   On: 03/18/2019 15:58   Result Date: 03/18/2019 CLINICAL DATA:  54 year old female with right lower abdominal pain and gross hematuria for 2 days. Nausea. EXAM: CT ABDOMEN AND PELVIS WITH CONTRAST TECHNIQUE: Multidetector CT imaging of the abdomen and pelvis was performed using the standard protocol following bolus administration of intravenous contrast. CONTRAST:  167mL OMNIPAQUE IOHEXOL 300 MG/ML  SOLN COMPARISON:  CT Abdomen and Pelvis 09/19/2014. FINDINGS: Lower chest: Negative; minor dependent atelectasis at the lung bases and incidental breast implants. Hepatobiliary: Chronic surgical absence of the gallbladder. Negative liver aside from possible hepatic steatosis. Pancreas: Negative. Spleen: Negative. Adrenals/Urinary Tract: Normal adrenal glands. Stable kidneys with chronic left renal upper pole cortical scarring. Symmetric renal enhancement and contrast excretion. Normal proximal ureters. The left ureter does passed through the abnormal left pelvic side wall  inflammation further described below. Urinary bladder detail is degraded by streak artifact from bilateral hip arthroplasties. Stomach/Bowel: Negative rectum, but the mid sigmoid colon is thick walled and inflamed (series 2, image 77 with circumferential wall thickening up to 11 millimeters. There is some diverticulosis in the region, and there is a small 2 centimeter extraluminal gas and fluid collection in the left sigmoid mesentery abutting the left pelvic side wall (series 2 image 74 and coronal image 53. Trace nearby mesenteric extraluminal gas and fluid also. Wall thickening decreases in the proximal sigmoid colon where additional diverticula are noted. The descending segment is within normal limits. Transverse colon and both flexures are negative aside from redundancy and retained stool. Negative right colon with normal appendix. Negative terminal ileum. Oral contrast has not yet reached the distal small bowel but there are no dilated loops. There are some fluid-filled loops in proximity to the abnormal sigmoid colon. Negative stomach and duodenum. No free intraperitoneal air. No abdominal free fluid. Vascular/Lymphatic: Major arterial structures in the abdomen and pelvis remain patent. Mild iliac artery atherosclerosis. Portal venous system is  patent. Reproductive: Surgically absent uterus with diminutive or absent ovaries. Other: There is trace free fluid in the pelvis with superimposed presacral and left pelvic side wall fluid and/or stranding. Musculoskeletal: Chronic L5 pars fractures and grade 1 spondylolisthesis. Superimposed L5 spina bifida occulta. Progressed and severe L5-S1 disc degeneration with new vacuum disc. Chronic severe L4-L5 disc degeneration with chronic vacuum disc and chronic severe facet arthropathy at both levels. Bilateral hip arthroplasty is new since 2016. No acute osseous abnormality identified. IMPRESSION: 1. Positive for inflamed Sigmoid colon with a small 2 cm contained  perforation in the mesentery abutting the left pelvic side wall. This is favored secondary to Sigmoid Diverticulitis. Regional inflammatory stranding and small volume of free fluid. No free intraperitoneal air, and no other fluid collection or adverse features. 2. No other acute or inflammatory process. Chronic left renal scarring. Bilateral hip arthroplasty is new from prior CT. Advanced degeneration of the lower lumbar spine has progressed since 2016. Electronically Signed: By: Genevie Ann M.D. On: 03/18/2019 15:46    Assessment & Plan:   Problem List Items Addressed This Visit      Unprioritized   Internal bleeding hemorrhoids    No evidence of thrombosis, .  Her pain may be coming from an anal fissure.  referring to [facility] for anuscopy and treatment of hemorrhoids.  Recommend proctofoam and nupercainal.        Other Visit Diagnoses    Acute hemorrhoid    -  Primary   Relevant Orders   Ambulatory referral to General Surgery   Bleeding internal hemorrhoids       Relevant Orders   Ambulatory referral to General Surgery      I have discontinued Akirra L. Streett's diltiazem. I am also having her start on hydrocortisone-pramoxine and dibucaine. Additionally, I am having her maintain her montelukast, estradiol, omeprazole, Biotin w/ Vitamins C & E (HAIR/SKIN/NAILS PO), rizatriptan, OVER THE COUNTER MEDICATION, gabapentin, Azelastine HCl, fluticasone, phentermine, meloxicam, and valsartan-hydrochlorothiazide.  Meds ordered this encounter  Medications  . hydrocortisone-pramoxine (PROCTOFOAM-HC) rectal foam    Sig: Place 1 applicator rectally 2 (two) times daily.    Dispense:  10 g    Refill:  1  . dibucaine (NUPERCAINAL) 1 % OINT    Sig: Place 1 application rectally as needed for hemorrhoids.    Dispense:  28 g    Refill:  1  . DISCONTD: diltiazem 2 % GEL    Sig: Apply 1 application topically 2 (two) times daily.    Dispense:  30 g    Refill:  2    Medications Discontinued During  This Encounter  Medication Reason  . diltiazem 2 % GEL     Follow-up: No follow-ups on file.   [name], MD

## 2019-06-19 NOTE — Patient Instructions (Addendum)
There are 3 medications that may help.  The nupercainal and the proctofoam are available OTC   Referral to Dr Bary Castilla for definitive treatment in progress

## 2019-06-20 DIAGNOSIS — K648 Other hemorrhoids: Secondary | ICD-10-CM | POA: Insufficient documentation

## 2019-06-20 NOTE — Assessment & Plan Note (Addendum)
No evidence of thrombosis, .  Her pain may be coming from an anal fissure.  referring to [facility] for anuscopy and treatment of hemorrhoids.  Recommend proctofoam and nupercainal.

## 2019-07-04 ENCOUNTER — Encounter: Payer: Self-pay | Admitting: General Practice

## 2019-07-04 LAB — POCT ERYTHROCYTE SEDIMENTATION RATE, NON-AUTOMATED: Sed Rate: 25

## 2019-07-04 LAB — CBC AND DIFFERENTIAL
HCT: 37 (ref 36–46)
Hemoglobin: 12.9 (ref 12.0–16.0)
Platelets: 314 (ref 150–399)
WBC: 6.9

## 2019-07-24 ENCOUNTER — Encounter: Payer: Self-pay | Admitting: Internal Medicine

## 2019-09-02 NOTE — Telephone Encounter (Signed)
Spoke with pt on the phone and advised her to go to [facility] to be seen today. Pt stated that she would try but wasn't sure she would be able to. Pt wanted to schedule an appt with Maudie Mercury, NP for Wednesday at 11:30 just incase she isn't able to go to UC. Pt is aware that she will ned to let the office know 24 hours before the appt so she does not get charged with a no show fee.

## 2019-09-03 ENCOUNTER — Ambulatory Visit: Payer: 59 | Admitting: Nurse Practitioner

## 2019-09-04 ENCOUNTER — Ambulatory Visit: Payer: 59 | Admitting: Nurse Practitioner

## 2019-09-04 ENCOUNTER — Other Ambulatory Visit: Payer: Self-pay

## 2019-09-04 ENCOUNTER — Encounter: Payer: Self-pay | Admitting: Nurse Practitioner

## 2019-09-04 VITALS — BP 124/70 | HR 68 | Temp 98.2°F | Ht 65.0 in | Wt 193.0 lb

## 2019-09-04 DIAGNOSIS — R109 Unspecified abdominal pain: Secondary | ICD-10-CM

## 2019-09-04 DIAGNOSIS — K589 Irritable bowel syndrome without diarrhea: Secondary | ICD-10-CM | POA: Insufficient documentation

## 2019-09-04 DIAGNOSIS — Z87448 Personal history of other diseases of urinary system: Secondary | ICD-10-CM | POA: Diagnosis not present

## 2019-09-04 DIAGNOSIS — K625 Hemorrhage of anus and rectum: Secondary | ICD-10-CM | POA: Insufficient documentation

## 2019-09-04 LAB — COMPREHENSIVE METABOLIC PANEL
ALT: 31 U/L (ref 0–35)
AST: 24 U/L (ref 0–37)
Albumin: 4.7 g/dL (ref 3.5–5.2)
Alkaline Phosphatase: 42 U/L (ref 39–117)
BUN: 9 mg/dL (ref 6–23)
CO2: 28 mEq/L (ref 19–32)
Calcium: 9.9 mg/dL (ref 8.4–10.5)
Chloride: 99 mEq/L (ref 96–112)
Creatinine, Ser: 0.77 mg/dL (ref 0.40–1.20)
GFR: 78.82 mL/min (ref >60.00)
Glucose, Bld: 94 mg/dL (ref 70–99)
Potassium: 4.2 mEq/L (ref 3.5–5.1)
Sodium: 136 mEq/L (ref 135–145)
Total Bilirubin: 0.3 mg/dL (ref 0.2–1.2)
Total Protein: 7.9 g/dL (ref 6.0–8.3)

## 2019-09-04 LAB — CBC WITH DIFFERENTIAL/PLATELET
Basophils Absolute: 0.1 10*3/uL (ref 0.0–0.1)
Basophils Relative: 0.8 % (ref 0.0–3.0)
Eosinophils Absolute: 0.3 10*3/uL (ref 0.0–0.7)
Eosinophils Relative: 3.9 % (ref 0.0–5.0)
HCT: 38.4 % (ref 36.0–46.0)
Hemoglobin: 13.2 g/dL (ref 12.0–15.0)
Lymphocytes Relative: 42.5 % (ref 12.0–46.0)
Lymphs Abs: 2.7 10*3/uL (ref 0.7–4.0)
MCHC: 34.5 g/dL (ref 30.0–36.0)
MCV: 88.8 fl (ref 78.0–100.0)
Monocytes Absolute: 0.5 10*3/uL (ref 0.1–1.0)
Monocytes Relative: 7.6 % (ref 3.0–12.0)
Neutro Abs: 2.9 10*3/uL (ref 1.4–7.7)
Neutrophils Relative %: 45.2 % (ref 43.0–77.0)
Platelets: 263 10*3/uL (ref 150.0–400.0)
RBC: 4.32 Mil/uL (ref 3.87–5.11)
RDW: 13.5 % (ref 11.5–15.5)
WBC: 6.4 10*3/uL (ref 4.0–10.5)

## 2019-09-04 LAB — URINALYSIS, ROUTINE W REFLEX MICROSCOPIC
Bilirubin Urine: NEGATIVE
Hgb urine dipstick: NEGATIVE
Ketones, ur: NEGATIVE
Leukocytes,Ua: NEGATIVE
Nitrite: NEGATIVE
Specific Gravity, Urine: 1.01 (ref 1.000–1.030)
Total Protein, Urine: NEGATIVE
Urine Glucose: NEGATIVE
Urobilinogen, UA: 0.2 (ref 0.0–1.0)
pH: 6 (ref 5.0–8.0)

## 2019-09-04 MED ORDER — HYOSCYAMINE SULFATE 0.125 MG PO TBDP: 0.1250 mg | ORAL_TABLET | Freq: Four times a day (QID) | ORAL | Status: AC | PRN

## 2019-09-04 NOTE — Progress Notes (Signed)
Established Patient Office Visit  Subjective:  Patient ID: Paula Moses, female    DOB: [birth year]  Age: 54 y.o. MRN: [id number]  CC:  Chief Complaint  Patient presents with  . Rectal Bleeding  . Hematuria   HPI Paula Moses is a 54 yo with history of diverticulitis January 2021 CT confirmed, required few months of antibiotic.  She had reported inflamed hemorrhoids since January.  She was diagnosed with a rectal fissure from straining by Dr. Bary Castilla he prescribed cream which resolved the rectal fissure.  He told her she had no internal or internal and external hemorrhoids present.   She does intermittent fasting.  She has noted crampy lower abdominal discomfort on and off before a BM. She had crampy feeling on  Saturday and improved on Sunday.  No nausea or upper GI complaints.  On Saturday she passed easy and formed bowel movement and when she wiped she saw bright red blood on the tissue and in the commode.  This happened a total of 3 times on Saturday and twice on Sunday and Monday. The quantity was not large.  She has had crampy abdominal pain before a bowel movement and then it resolves.  No residual abdominal pain.  No sense that this is a return of diverticulitis.  She did not utilize hemorrhoid treatment because she does not think she has hemorrhoids.  She currently has no abdominal pain or cramps and feels well. She has had no fevers or chills.  She had formed stool today and yesterday and saw no further bleeding.  She has no rectal itching or burning or pain.   She has noted no lightheadedness, dizziness, chest pain, shortness of breath.  No weight loss, appetite change.   She has seen blood in urine off and on for 2 months. No dysuria, frequency, or change in urine pattern. No  blood in stool or urine yesterday or today.   Dr. Bonna Gains  did her colonoscopy 01/31/2019: Hemorrhoids were found in the perianal area.  She had small nonbleeding internal hemorrhoids, and 2 small  polyps.    Past Medical History:  Diagnosis Date  . Abnormal Pap smear of cervix   . Abnormal vaginal bleeding   . Anxiety   . Arthritis    neck and body  . Complication of anesthesia    IT TAKES ALOT OF ANESTHESIA TO KEEP PT SEDATED  . Cyst of vulva   . Dyspareunia   . External hemorrhoid   . Fibroids   . GERD (gastroesophageal reflux disease)   . Headache 2019   migraines, becoming more frequent  . High cholesterol   . History of UTI   . Hypertension   . Left-sided face pain   . Neck pain   . Paresthesia   . Rectocele   . Surgical menopause   . Trigeminal neuralgia   . Uterus prolapse     Past Surgical History:  Procedure Laterality Date  . AUGMENTATION MAMMAPLASTY Bilateral 2006  . BREAST ENHANCEMENT SURGERY    . CHOLECYSTECTOMY    . COLONOSCOPY WITH PROPOFOL N/A 01/31/2019   Procedure: COLONOSCOPY WITH PROPOFOL;  Surgeon: [name], MD;  Location: [facility];  Service: Endoscopy;  Laterality: N/A;  2 day prep   . HIP ARTHROSCOPY Right 05/05/2017   Procedure: ARTHROSCOPY HIP;  Surgeon: Leim Fabry, MD;  Location: [facility] OR[facility];  Service: Orthopedics;  Laterality: Right;  . LAPAROSCOPY     X2  . TOTAL HIP ARTHROPLASTY Right 10/26/2017  Procedure: TOTAL HIP ARTHROPLASTY ANTERIOR APPROACH;  Surgeon: [name], MD;  Location: [facility] ORS;  Service: Orthopedics;  Laterality: Right;  . TOTAL HIP ARTHROPLASTY Left 03/30/2018   Procedure: LEFT TOTAL HIP ARTHROPLASTY ANTERIOR APPROACH;  Surgeon: [name], MD;  Location: [facility];  Service: Orthopedics;  Laterality: Left;  . tummy tuck     . VAGINAL HYSTERECTOMY     lahv bso    Family History  Problem Relation Age of Onset  . Brain cancer Mother   . Dementia Father   . Breast cancer Neg Hx     Social History   Socioeconomic History  . Marital status: Married    Spouse name: Not on file  . Number of children: 4  . Years of education: BA  . Highest education level: Not on file   Occupational History  . Occupation: realtor  Tobacco Use  . Smoking status: Never Smoker  . Smokeless tobacco: Never Used  Vaping Use  . Vaping Use: Never used  Substance and Sexual Activity  . Alcohol use: Yes    Comment: none last 24hrs  . Drug use: No  . Sexual activity: Yes    Birth control/protection: Surgical  Other Topics Concern  . Not on file  Social History Narrative   Patient lives at home at home with her Husband [name]) Patient works full time Cabin crew full time .   Education college.   Right handed.   Caffeine one cup of coffee daily.   Social Determinants of Health   Financial Resource Strain:   . Difficulty of Paying Living Expenses:   Food Insecurity:   . Worried About Charity fundraiser in the Last Year:   . Arboriculturist in the Last Year:   Transportation Needs:   . Film/video editor (Medical):   Paula Moses Kitchen Lack of Transportation (Non-Medical):   Physical Activity:   . Days of Exercise per Week:   . Minutes of Exercise per Session:   Stress:   . Feeling of Stress :   Social Connections:   . Frequency of Communication with Friends and Family:   . Frequency of Social Gatherings with Friends and Family:   . Attends Religious Services:   . Active Member of Clubs or Organizations:   . Attends Archivist Meetings:   Paula Moses Kitchen Marital Status:   Intimate Partner Violence:   . Fear of Current or Ex-Partner:   . Emotionally Abused:   Paula Moses Kitchen Physically Abused:   . Sexually Abused:     Outpatient Medications Prior to Visit  Medication Sig Dispense Refill  . Azelastine HCl 137 MCG/SPRAY SOLN Place 2 sprays into both nostrils 2 (two) times daily.    . Biotin w/ Vitamins C & E (HAIR/SKIN/NAILS PO) Take 2 tablets by mouth every evening.    . dibucaine (NUPERCAINAL) 1 % OINT Place 1 application rectally as needed for hemorrhoids. 28 g 1  . estradiol (ESTRACE) 2 MG tablet Take 2 mg by mouth at bedtime.     . fluticasone (FLONASE) 50 MCG/ACT nasal spray SPRAY TWICE  IN EACH NOSTRIL ONCE DAILY    . meloxicam (MOBIC) 7.5 MG tablet Take 1 tablet by mouth once daily 30 tablet 5  . montelukast (SINGULAIR) 10 MG tablet Take 10 mg by mouth at bedtime.    Paula Moses Kitchen omeprazole (PRILOSEC OTC) 20 MG tablet Take 20 mg by mouth daily as needed (acid reflux).    Paula Moses Kitchen OVER THE COUNTER MEDICATION Take 3 tablets by mouth  2 (two) times daily. Fiber Well Gummies    . valsartan-hydrochlorothiazide (DIOVAN-HCT) 160-12.5 MG tablet Take 1 tablet by mouth once daily 90 tablet 0  . gabapentin (NEURONTIN) 300 MG capsule Take 2 capsules (600 mg total) by mouth 3 (three) times daily. (Patient not taking: Reported on 09/04/2019) 3090 capsule 3  . hydrocortisone-pramoxine (PROCTOFOAM-HC) rectal foam Place 1 applicator rectally 2 (two) times daily. (Patient not taking: Reported on 09/04/2019) 10 g 1  . phentermine (ADIPEX-P) 37.5 MG tablet Take 1 tablet (37.5 mg total) by mouth daily before breakfast. (Patient not taking: Reported on 09/04/2019) 90 tablet 0  . rizatriptan (MAXALT) 5 MG tablet Take 5 mg by mouth every 2 (two) hours as needed for migraine. (Patient not taking: Reported on 09/04/2019)  0   No facility-administered medications prior to visit.    Allergies  Allergen Reactions  . Aspirin Shortness Of Breath and Other (See Comments)    congestion  . Codeine Itching and Other (See Comments)    Facial itching   . Percocet [Oxycodone-Acetaminophen] Itching and Other (See Comments)    Facial itching   . Phenergan [Promethazine Hcl] Itching and Other (See Comments)    Facial itching   . Tape Other (See Comments)    Surgical tape caused blisters  . Tramadol     Nausea     Review of Systems Positive review of systems as noted in history of present illness otherwise negative.   Objective:    Physical Exam Vitals reviewed.  Constitutional:      Appearance: Normal appearance.  Cardiovascular:     Rate and Rhythm: Normal rate and regular rhythm.     Pulses: Normal pulses.     Heart  sounds: Normal heart sounds.  Pulmonary:     Effort: Pulmonary effort is normal.     Breath sounds: Normal breath sounds.  Abdominal:     General: Bowel sounds are normal. There is no distension.     Palpations: Abdomen is soft. There is no mass.     Tenderness: There is no abdominal tenderness. There is no guarding or rebound.     Hernia: No hernia is present.  Genitourinary:    Comments: Rectal exam shows no anal fissure or tears.  No obvious hemorrhoid. Rectal  exam shows formed stool, smooth walls, no obvious hemorrhoid bulging.  Scant brown stool Hemoccult negative. Musculoskeletal:        General: Normal range of motion.     Cervical back: Normal range of motion and neck supple.  Skin:    General: Skin is warm and dry.  Neurological:     General: No focal deficit present.     Mental Status: She is alert and oriented to person, place, and time.     BP 124/70   Pulse 68   Temp 98.2 F (36.8 C) (Temporal)   Ht _0  (1.651 m)   Wt 193 lb (87.5 kg)   SpO2 97%   BMI 32.12 kg/m  Wt Readings from Last 3 Encounters:  09/04/19 193 lb (87.5 kg)  06/19/19 186 lb 6.4 oz (84.6 kg)  02/28/19 190 lb (86.2 kg)     Health Maintenance Due  Topic Date Due  . COVID-19 Vaccine (1) Never done  . TETANUS/TDAP  Never done  . PAP SMEAR-Modifier  11/13/2014  . MAMMOGRAM  12/02/2018    There are no preventive care reminders to display for this patient.  Lab Results  Component Value Date   TSH 3.49 01/18/2019  Lab Results  Component Value Date   WBC 6.4 09/04/2019   HGB 13.2 09/04/2019   HCT 38.4 09/04/2019   MCV 88.8 09/04/2019   PLT 263.0 09/04/2019   Lab Results  Component Value Date   NA 136 09/04/2019   K 4.2 09/04/2019   CO2 28 09/04/2019   GLUCOSE 94 09/04/2019   BUN 9 09/04/2019   CREATININE 0.77 09/04/2019   BILITOT 0.3 09/04/2019   ALKPHOS 42 09/04/2019   AST 24 09/04/2019   ALT 31 09/04/2019   PROT 7.9 09/04/2019   ALBUMIN 4.7 09/04/2019   CALCIUM 9.9  09/04/2019   ANIONGAP 10 03/31/2018   GFR 78.82 09/04/2019   Lab Results  Component Value Date   CHOL 252 (H) 01/18/2019   Lab Results  Component Value Date   HDL 56.90 01/18/2019   No results found for: [facility] Lab Results  Component Value Date   TRIG 238.0 (H) 01/18/2019   Lab Results  Component Value Date   CHOLHDL 4 01/18/2019   No results found for: HGBA1C    Assessment & Plan:   Problem List Items Addressed This Visit      Digestive   BRBPR (bright red blood per rectum) - Primary   Relevant Orders   CBC with Differential/Platelet (Completed)   Ambulatory referral to Gastroenterology   Comp Met (CMET) (Completed)   Irritable bowel syndrome   Relevant Medications   hyoscyamine (ANASPAZ) disintergrating tablet 0.125 mg     Other   Abdominal cramps   Relevant Medications   hyoscyamine (ANASPAZ) disintergrating tablet 0.125 mg   Other Relevant Orders   Ambulatory referral to Gastroenterology   Celiac Disease Ab Screen w/Rfx (Completed)   History of blood in urine   Relevant Orders   Urinalysis, Routine w reflex microscopic (Completed)   Urine Culture      Meds ordered this encounter  Medications  . hyoscyamine (ANASPAZ) disintergrating tablet 0.125 mg    Trial of Citrucel which is a natural fiber.    A trial of hyoscyamine which is an irritable bowel antispasm medicine to take as directed.  This should help with lower abdominal cramps.  Referral into your gastroenterologist for further evaluation and management of your rectal bleeding. Appt 09/25/2019.   See the enclosed [place] diet information  Get help right away if:  You have new or increased rectal bleeding.  You have black or dark red stools.  You vomit blood or something that looks like coffee grounds.  You have pain or tenderness in your abdomen.  You have a fever.  You feel weak.  You feel nauseous.  You faint.  You have severe pain in your rectum.  You cannot have a  bowel movement.  Follow-up: Return if symptoms worsen or fail to improve.   A total of 30 minutes of face to face time was spent with patient.  This visit occurred during the SARS-CoV-2 public health emergency.  Safety protocols were in place, including screening questions prior to the visit, additional usage of staff PPE, and extensive cleaning of exam room while observing appropriate contact time as indicated for disinfecting solutions.   Denice Paradise, NP

## 2019-09-04 NOTE — Patient Instructions (Addendum)
Please go to the lab today.  Trial of Citrucel which is a natural fiber.    A trial of hyoscyamine which is an irritable bowel antispasm medicine to take as directed.  This should help with lower abdominal cramps.  Referral into your gastroenterologist for further evaluation and management of your rectal bleeding.  See the enclosed Low - fodmap diet information  Get help right away if:  You have new or increased rectal bleeding.  You have black or dark red stools.  You vomit blood or something that looks like coffee grounds.  You have pain or tenderness in your abdomen.  You have a fever.  You feel weak.  You feel nauseous.  You faint.  You have severe pain in your rectum.  You cannot have a bowel movement.   Rectal Bleeding  Rectal bleeding is when blood passes out of the anus. People with rectal bleeding may notice bright red blood in their underwear or in the toilet after having a bowel movement. They may also have dark red or black stools. Rectal bleeding is usually a sign that something is wrong. Many things can cause rectal bleeding, including:  Hemorrhoids. These are blood vessels in the anus or rectum that are larger than normal.  Fistulas. These are abnormal passages in the rectum and anus.  Anal fissures. This is a tear in the anus.  Diverticulosis. This is a condition in which pockets or sacs project from the bowel.  Proctitis and colitis. These are conditions in which the rectum, colon, or anus become inflamed.  Polyps. These are growths that can be cancerous (malignant) or non-cancerous (benign).  Part of the rectum sticking out from the anus (rectal prolapse).  Certain medicines.  Intestinal infections. Follow these instructions at home: Pay attention to any changes in your symptoms. Take these actions to help lessen bleeding and discomfort:  Eat a diet that is high in fiber. This will keep your stool soft, making it easier to pass stools  without straining. Ask your health care provider what foods and drinks are high in fiber.  Drink enough fluid to keep your urine clear or pale yellow. This also helps to keep your stool soft.  Try taking a warm bath. This may help soothe any pain in your rectum.  Keep all follow-up visits as told by your health care provider. This is important. Get help right away if:  You have new or increased rectal bleeding.  You have black or dark red stools.  You vomit blood or something that looks like coffee grounds.  You have pain or tenderness in your abdomen.  You have a fever.  You feel weak.  You feel nauseous.  You faint.  You have severe pain in your rectum.  You cannot have a bowel movement. This information is not intended to replace advice given to you by your health care provider. Make sure you discuss any questions you have with your health care provider. Document Revised: 10/22/2015 Document Reviewed: 04/26/2015 Elsevier Patient Education  [place].  Irritable Bowel Syndrome, Adult  Irritable bowel syndrome (IBS) is a group of symptoms that affects the organs responsible for digestion (gastrointestinal or GI tract). IBS is not one specific disease. To regulate how the GI tract works, the body sends signals back and forth between the intestines and the brain. If you have IBS, there may be a problem with these signals. As a result, the GI tract does not function normally. The intestines may become more  sensitive and overreact to certain things. This may be especially true when you eat certain foods or when you are under stress. There are four types of IBS. These may be determined based on the consistency of your stool (feces):  IBS with diarrhea.  IBS with constipation.  Mixed IBS.  Unsubtyped IBS. It is important to know which type of IBS you have. Certain treatments are more likely to be helpful for certain types of IBS. What are the causes? The exact cause  of IBS is not known. What increases the risk? You may have a higher risk for IBS if you:  Are female.  Are younger than 81.  Have a family history of IBS.  Have a mental health condition, such as depression, anxiety, or post-traumatic stress disorder.  Have had a bacterial infection of your GI tract. What are the signs or symptoms? Symptoms of IBS vary from person to person. The main symptom is abdominal pain or discomfort. Other symptoms usually include one or more of the following:  Diarrhea, constipation, or both.  Abdominal swelling or bloating.  Feeling full after eating a small or regular-sized meal.  Frequent gas.  Mucus in the stool.  A feeling of having more stool left after a bowel movement. Symptoms tend to come and go. They may be triggered by stress, mental health conditions, or certain foods. How is this diagnosed? This condition may be diagnosed based on a physical exam, your medical history, and your symptoms. You may have tests, such as:  Blood tests.  Stool test.  X-rays.  CT scan.  Colonoscopy. This is a procedure in which your GI tract is viewed with a long, thin, flexible tube. How is this treated? There is no cure for IBS, but treatment can help relieve symptoms. Treatment depends on the type of IBS you have, and may include:  Changes to your diet, such as: ? Avoiding foods that cause symptoms. ? Drinking more water. ? Following a low-FODMAP (fermentable oligosaccharides, disaccharides, monosaccharides, and polyols) diet for up to 6 weeks, or as told by your health care provider. FODMAPs are sugars that are hard for some people to digest. ? Eating more fiber. ? Eating medium-sized meals at the same times every day.  Medicines. These may include: ? Fiber supplements, if you have constipation. ? Medicine to control diarrhea (antidiarrheal medicines). ? Medicine to help control muscle tightening (spasms) in your GI tract (antispasmodic  medicines). ? Medicines to help with mental health conditions, such as antidepressants or tranquilizers.  Talk therapy or counseling.  Working with a diet and nutrition specialist (dietitian) to help create a food plan that is right for you.  Managing your stress. Follow these instructions at home: Eating and drinking  Eat a healthy diet.  Eat medium-sized meals at about the same time every day. Do not eat large meals.  Gradually eat more fiber-rich foods. These include whole grains, fruits, and vegetables. This may be especially helpful if you have IBS with constipation.  Eat a diet low in FODMAPs.  Drink enough fluid to keep your urine pale yellow.  Keep a journal of foods that seem to trigger symptoms.  Avoid foods and drinks that: ? Contain added sugar. ? Make your symptoms worse. Dairy products, caffeinated drinks, and carbonated drinks can make symptoms worse for some people. General instructions  Take over-the-counter and prescription medicines and supplements only as told by your health care provider.  Get enough exercise. Do at least 150 minutes of moderate-intensity exercise  each week.  Manage your stress. Getting enough sleep and exercise can help you manage stress.  Keep all follow-up visits as told by your health care provider and therapist. This is important. Alcohol Use  Do not drink alcohol if: ? Your health care provider tells you not to drink. ? You are pregnant, may be pregnant, or are planning to become pregnant.  If you drink alcohol, limit how much you have: ? 0-1 drink a day for women. ? 0-2 drinks a day for men.  Be aware of how much alcohol is in your drink. In the [place] one drink equals one typical bottle of beer (12 oz), one-half glass of wine (5 oz), or one shot of hard liquor (1 oz). Contact a health care provider if you have:  Constant pain.  Weight loss.  Difficulty or pain when swallowing.  Diarrhea that gets worse. Get help  right away if you have:  Severe abdominal pain.  Fever.  Diarrhea with symptoms of dehydration, such as dizziness or dry mouth.  Bright red blood in your stool.  Stool that is black and tarry.  Abdominal swelling.  Vomiting that does not stop.  Blood in your vomit. Summary  Irritable bowel syndrome (IBS) is not one specific disease. It is a group of symptoms that affects digestion.  Your intestines may become more sensitive and overreact to certain things. This may be especially true when you eat certain foods or when you are under stress.  There is no cure for IBS, but treatment can help relieve symptoms. This information is not intended to replace advice given to you by your health care provider. Make sure you discuss any questions you have with your health care provider. Document Revised: 02/21/2017 Document Reviewed: 02/21/2017 Elsevier Patient Education  [place].  Constipation, Adult Constipation is when a person has fewer bowel movements in a week than normal, has difficulty having a bowel movement, or has stools that are dry, hard, or larger than normal. Constipation may be caused by an underlying condition. It may become worse with age if a person takes certain medicines and does not take in enough fluids. Follow these instructions at home: Eating and drinking   Eat foods that have a lot of fiber, such as fresh fruits and vegetables, whole grains, and beans.  Limit foods that are high in fat, low in fiber, or overly processed, such as french fries, hamburgers, cookies, candies, and soda.  Drink enough fluid to keep your urine clear or pale yellow. General instructions  Exercise regularly or as told by your health care provider.  Go to the restroom when you have the urge to go. Do not hold it in.  Take over-the-counter and prescription medicines only as told by your health care provider. These include any fiber supplements.  Practice pelvic floor  retraining exercises, such as deep breathing while relaxing the lower abdomen and pelvic floor relaxation during bowel movements.  Watch your condition for any changes.  Keep all follow-up visits as told by your health care provider. This is important. Contact a health care provider if:  You have pain that gets worse.  You have a fever.  You do not have a bowel movement after 4 days.  You vomit.  You are not hungry.  You lose weight.  You are bleeding from the anus.  You have thin, pencil-like stools. Get help right away if:  You have a fever and your symptoms suddenly get worse.  You leak stool  or have blood in your stool.  Your abdomen is bloated.  You have severe pain in your abdomen.  You feel dizzy or you faint. This information is not intended to replace advice given to you by your health care provider. Make sure you discuss any questions you have with your health care provider. Document Revised: 02/10/2017 Document Reviewed: 08/19/2015 Elsevier Patient Education  2020 [employer].

## 2019-09-05 ENCOUNTER — Encounter: Payer: Self-pay | Admitting: Nurse Practitioner

## 2019-09-05 LAB — URINE CULTURE
MICRO NUMBER:: 10625298
SPECIMEN QUALITY:: ADEQUATE

## 2019-09-06 LAB — CELIAC DISEASE AB SCREEN W/RFX
Antigliadin Abs, IgA: 7 units (ref 0–19)
IgA/Immunoglobulin A, Serum: 401 mg/dL — ABNORMAL HIGH (ref 87–352)
Transglutaminase IgA: <2 U/mL (ref 0–3)

## 2019-09-07 ENCOUNTER — Encounter: Payer: Self-pay | Admitting: Nurse Practitioner

## 2019-09-12 ENCOUNTER — Telehealth: Payer: 59 | Admitting: Internal Medicine

## 2019-09-13 ENCOUNTER — Emergency Department (HOSPITAL_COMMUNITY): Payer: 59

## 2019-09-13 ENCOUNTER — Encounter (HOSPITAL_COMMUNITY): Payer: Self-pay

## 2019-09-13 ENCOUNTER — Emergency Department (HOSPITAL_COMMUNITY)
Admission: EM | Admit: 2019-09-13 | Discharge: 2019-09-13 | Disposition: A | Discharge status: Home / Self Care | Payer: 59 | Attending: Emergency Medicine | Admitting: Emergency Medicine

## 2019-09-13 DIAGNOSIS — R0789 Other chest pain: Secondary | ICD-10-CM | POA: Insufficient documentation

## 2019-09-13 DIAGNOSIS — I10 Essential (primary) hypertension: Secondary | ICD-10-CM | POA: Insufficient documentation

## 2019-09-13 DIAGNOSIS — Z79899 Other long term (current) drug therapy: Secondary | ICD-10-CM | POA: Insufficient documentation

## 2019-09-13 DIAGNOSIS — R079 Chest pain, unspecified: Secondary | ICD-10-CM

## 2019-09-13 LAB — CBC
HCT: 40 % (ref 36.0–46.0)
Hemoglobin: 13.2 g/dL (ref 12.0–15.0)
MCH: 29.9 pg (ref 26.0–34.0)
MCHC: 33 g/dL (ref 30.0–36.0)
MCV: 90.5 fL (ref 80.0–100.0)
Platelets: 257 10*3/uL (ref 150–400)
RBC: 4.42 MIL/uL (ref 3.87–5.11)
RDW: 13.4 % (ref 11.5–15.5)
WBC: 6.8 10*3/uL (ref 4.0–10.5)
nRBC: 0 % (ref 0.0–0.2)

## 2019-09-13 LAB — TROPONIN I (HIGH SENSITIVITY)
Troponin I (High Sensitivity): 2 ng/L (ref <18)
Troponin I (High Sensitivity): 2 ng/L (ref <18)
Troponin I (High Sensitivity): 2 ng/L (ref <18)

## 2019-09-13 LAB — BASIC METABOLIC PANEL
Anion gap: 7 (ref 5–15)
BUN: 13 mg/dL (ref 6–20)
CO2: 28 mmol/L (ref 22–32)
Calcium: 9.3 mg/dL (ref 8.9–10.3)
Chloride: 102 mmol/L (ref 98–111)
Creatinine, Ser: 0.94 mg/dL (ref 0.44–1.00)
GFR calc Af Amer: >60 mL/min (ref >60)
GFR calc non Af Amer: >60 mL/min (ref >60)
Glucose, Bld: 98 mg/dL (ref 70–99)
Potassium: 4.4 mmol/L (ref 3.5–5.1)
Sodium: 137 mmol/L (ref 135–145)

## 2019-09-13 LAB — I-STAT BETA HCG BLOOD, ED (MC, WL, AP ONLY): I-stat hCG, quantitative: <5 m[IU]/mL (ref <5)

## 2019-09-13 LAB — D-DIMER, QUANTITATIVE: D-Dimer, Quant: 0.29 ug/mL-FEU (ref 0.00–0.50)

## 2019-09-13 MED ORDER — SODIUM CHLORIDE 0.9% FLUSH: 3.0000 mL | Freq: Once | INTRAVENOUS | Status: DC

## 2019-09-13 NOTE — ED Notes (Signed)
Discharge instructions reviewed with pt. Pt verbalized understanding.   

## 2019-09-13 NOTE — ED Triage Notes (Signed)
Pt arrives to ED w/ c/o intermittent centrally located 5/10 chest pain that started at back of shoulder blades and then radiated forward to chest. Pt endorses L arm pain and sob. Pt denies cardiac hx.

## 2019-09-13 NOTE — ED Provider Notes (Signed)
[facility]ERGENCY DEPART[facility] Provider Note   CSN: [id number] Arrival date & time: 09/13/19  1239     History Chief Complaint  Patient presents with  . Chest Pain    Paula Moses is a 54 y.o. female.  HPI Patient is a 54 year old female with a medical history as noted below, particularly a history of hypertension which is controlled with valsartan as well as hydrochlorothiazide.  Patient states this morning she was in her kitchen and began experiencing 5/10 sharp upper back pain between her shoulder blades.  This lasted about 2 to 3 minutes and spontaneously subsided.  She states she was SOB during the episode and the pain "stopped her in her tracks". Shortly thereafter she began experiencing "reflux-like symptoms" that started in her abdomen and radiated up through her central chest.  These also subsided and then shortly thereafter she began experiencing central chest pain that was similar to her prior back pain.  This radiated through her left shoulder and down her left arm.  She describes the pain in her left arm is more of a tight/aching pain.  This is still present.  She reports associated paresthesias in the fingers of the left hand.  She states her stomach still feels "uneasy" but denies any vomiting.  No current chest pain.  No syncope or dizziness.    Past Medical History:  Diagnosis Date  . Abnormal Pap smear of cervix   . Abnormal vaginal bleeding   . Anxiety   . Arthritis    neck and body  . Complication of anesthesia    IT TAKES ALOT OF ANESTHESIA TO KEEP PT SEDATED  . Cyst of vulva   . Dyspareunia   . External hemorrhoid   . Fibroids   . GERD (gastroesophageal reflux disease)   . Headache 2019   migraines, becoming more frequent  . High cholesterol   . History of UTI   . Hypertension   . Left-sided face pain   . Neck pain   . Paresthesia   . Rectocele   . Surgical menopause   . Trigeminal neuralgia   . Uterus prolapse     Patient  Active Problem List   Diagnosis Date Noted  . BRBPR (bright red blood per rectum) 09/04/2019  . Abdominal cramps 09/04/2019  . History of blood in urine 09/04/2019  . Irritable bowel syndrome 09/04/2019  . Internal bleeding hemorrhoids 06/20/2019  . Lumbar radiculopathy, acute 02/28/2019  . Spinal stenosis of lumbar region without neurogenic claudication 02/28/2019  . Obesity 02/28/2019  . History of total right hip replacement 02/25/2019  . Screening for colon cancer   . Rectal polyp   . Polyp of colon   . Osteoarthritis of fingers of both hands 12/21/2018  . Constipation 12/12/2018  . 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 Unilateral primary osteoarthritis, left hip 01/22/2018  . Arthritis of left hip 12/20/2017  . Degenerative disc disease, lumbar 12/20/2017  . Status post total hip replacement, right 10/26/2017  . Femoroacetabular impingement 05/05/2017  . Surgical menopause 01/26/2017  . Status post vaginal hysterectomy BSO 01/26/2017  . Hypertension   . High cholesterol   . Fibroids   . Trigeminal neuralgia   . Left-sided face pain   . Neck pain on left side 07/16/2013  . Left facial pain 07/16/2013  . Paresthesia 07/16/2013    Past Surgical History:  Procedure Laterality Date  . AUGMENTATION MAMMAPLASTY Bilateral 2006  . BREAST ENHANCEMENT SURGERY    .  CHOLECYSTECTOMY    . COLONOSCOPY WITH PROPOFOL N/A 01/31/2019   Procedure: COLONOSCOPY WITH PROPOFOL;  Surgeon: [name], MD;  Location: [facility] ENDOSCOPY;  Service: Endoscopy;  Laterality: N/A;  2 day prep   . HIP ARTHROSCOPY Right 05/05/2017   Procedure: ARTHROSCOPY HIP;  Surgeon: Leim Fabry, MD;  Location: [facility] ORS;  Service: Orthopedics;  Laterality: Right;  . LAPAROSCOPY     X2  . TOTAL HIP ARTHROPLASTY Right 10/26/2017   Procedure: TOTAL HIP ARTHROPLASTY ANTERIOR APPROACH;  Surgeon: [name], MD;  Location: [facility] ORS;  Service: Orthopedics;  Laterality: Right;  . TOTAL HIP  ARTHROPLASTY Left 03/30/2018   Procedure: LEFT TOTAL HIP ARTHROPLASTY ANTERIOR APPROACH;  Surgeon: [name], MD;  Location: [facility];  Service: Orthopedics;  Laterality: Left;  . tummy tuck     . VAGINAL HYSTERECTOMY     lahv bso     OB History    Gravida  5   Para  4   Term  4   Preterm      AB  1   Living  4     SAB  1   TAB      Ectopic      Multiple      Live Births  4           Family History  Problem Relation Age of Onset  . Brain cancer Mother   . Dementia Father   . Breast cancer Neg Hx     Social History   Tobacco Use  . Smoking status: Never Smoker  . Smokeless tobacco: Never Used  Vaping Use  . Vaping Use: Never used  Substance Use Topics  . Alcohol use: Yes    Comment: none last 24hrs  . Drug use: No    Home Medications Prior to Admission medications   Medication Sig Start Date End Date Taking? Authorizing Provider  Azelastine HCl 137 MCG/SPRAY SOLN Place 2 sprays into both nostrils 2 (two) times daily. 01/18/19   [provider]  Biotin w/ Vitamins C & E (HAIR/SKIN/NAILS PO) Take 2 tablets by mouth every evening.    [provider]  dibucaine (NUPERCAINAL) 1 % OINT Place 1 application rectally as needed for hemorrhoids. 06/19/19   [name], MD  estradiol (ESTRACE) 2 MG tablet Take 2 mg by mouth at bedtime.     [provider]  fluticasone (FLONASE) 50 MCG/ACT nasal spray SPRAY TWICE IN EACH NOSTRIL ONCE DAILY 02/17/19   [provider]  gabapentin (NEURONTIN) 300 MG capsule Take 2 capsules (600 mg total) by mouth 3 (three) times daily. Patient not taking: Reported on 09/04/2019 05/07/18   [name], MD  hydrocortisone-pramoxine [facility]) rectal foam Place 1 applicator rectally 2 (two) times daily. Patient not taking: Reported on 09/04/2019 06/19/19   [name], MD  meloxicam [facility]) 7.5 MG tablet Take 1 tablet by mouth once daily 05/02/19   [name], MD    montelukast (SINGULAIR) 10 MG tablet Take 10 mg by mouth at bedtime.    [provider]  omeprazole (PRILOSEC OTC) 20 MG tablet Take 20 mg by mouth daily as needed (acid reflux).    [provider]  OVER THE COUNTER MEDICATION Take 3 tablets by mouth 2 (two) times daily. Fiber Well Gummies    [provider]  phentermine (ADIPEX-P) 37.5 MG tablet Take 1 tablet (37.5 mg total) by mouth daily before breakfast. Patient not taking: Reported on 09/04/2019 03/02/19  [name], MD  rizatriptan (MAXALT) 5 MG tablet Take 5 mg by mouth every 2 (two) hours as needed for migraine. Patient not taking: Reported on 09/04/2019 09/21/17   [provider]  valsartan-hydrochlorothiazide (DIOVAN-HCT) 160-12.5 MG tablet Take 1 tablet by mouth once daily 06/06/19   [name], MD    Allergies    Aspirin, Codeine, Percocet [oxycodone-acetaminophen], Phenergan [promethazine hcl], Tape, and Tramadol  Review of Systems   Review of Systems  All other systems reviewed and are negative. Ten systems reviewed and are negative for acute change, except as noted in the HPI.    Physical Exam Updated Vital Signs BP 130/75 (BP Location: Right Arm)   Pulse 65   Temp 98.3 F (36.8 C) (Oral)   Resp 18   Ht 5\' 5"  (1.651 m)   Wt 85.3 kg   SpO2 100%   BMI 31.28 kg/m   Physical Exam Vitals and nursing note reviewed.  Constitutional:      General: She is not in acute distress.    Appearance: Normal appearance. She is not ill-appearing, toxic-appearing or diaphoretic.  HENT:     Head: Normocephalic and atraumatic.     Right Ear: External ear normal.     Left Ear: External ear normal.     Nose: Nose normal.     Mouth/Throat:     Mouth: Mucous membranes are moist.     Pharynx: Oropharynx is clear. No oropharyngeal exudate or posterior oropharyngeal erythema.  Eyes:     Extraocular Movements: Extraocular movements intact.  Cardiovascular:     Rate and Rhythm: Normal rate  and regular rhythm.     Pulses: Normal pulses.          Radial pulses are 2+ on the right side and 2+ on the left side.       Dorsalis pedis pulses are 2+ on the right side and 2+ on the left side.     Heart sounds: Normal heart sounds. Heart sounds not distant. No murmur heard.  No systolic murmur is present.  No diastolic murmur is present.  No friction rub. No gallop.   Pulmonary:     Effort: Pulmonary effort is normal. No tachypnea, accessory muscle usage or respiratory distress.     Breath sounds: Normal breath sounds. No stridor. No decreased breath sounds, wheezing, rhonchi or rales.  Chest:     Chest wall: No mass, deformity, tenderness or crepitus.  Abdominal:     General: Abdomen is flat. Bowel sounds are normal.     Palpations: Abdomen is soft.     Tenderness: There is no abdominal tenderness.  Musculoskeletal:        General: Normal range of motion.     Cervical back: Normal range of motion and neck supple. No tenderness.     Right lower leg: No tenderness. No edema.     Left lower leg: No tenderness. No edema.     Comments: No calf tenderness.  No leg swelling.  Negative Homans' sign.  Skin:    General: Skin is warm and dry.  Neurological:     General: No focal deficit present.     Mental Status: She is alert and oriented to person, place, and time.  Psychiatric:        Mood and Affect: Mood normal.        Behavior: Behavior normal.    ED Results / Procedures / Treatments   Labs (all labs ordered are listed, but only abnormal  results are displayed) Labs Reviewed  BASIC METABOLIC PANEL  CBC  D-DIMER, QUANTITATIVE (NOT AT [facility])  I-STAT BETA HCG BLOOD, ED ([facility], [facility], [facility] ONLY)  TROPONIN I (HIGH SENSITIVITY)  TROPONIN I (HIGH SENSITIVITY)  TROPONIN I (HIGH SENSITIVITY)   EKG EKG Interpretation  Date/Time:  Friday September 13 2019 12:50:01 EDT Ventricular Rate:  67 PR Interval:  122 QRS Duration: 86 QT Interval:  396 QTC Calculation: 418 R Axis:   -7 Text  Interpretation: Normal sinus rhythm Possible Anterior infarct , age undetermined Abnormal ECG Confirmed by Quintella Reichert [phone]) on 09/13/2019 5:41:14 PM  Radiology DG Chest 2 View  Result Date: 09/13/2019 CLINICAL DATA:  Chest pain radiating to the left arm, dyspnea EXAM: CHEST - 2 VIEW COMPARISON:  None. FINDINGS: The heart size and mediastinal contours are within normal limits. Both lungs are clear. The visualized skeletal structures are unremarkable. IMPRESSION: No active cardiopulmonary disease. Electronically Signed   By: Fidela Salisbury MD   On: 09/13/2019 13:26   Procedures Procedures   Medications Ordered in ED Medications  sodium chloride flush (NS) 0.9 % injection 3 mL (has no administration in time range)   ED Course  I have reviewed the triage vital signs and the nursing notes.  Pertinent labs & imaging results that were available during my care of the patient were reviewed by me and considered in my medical decision making (see chart for details).    MDM Rules/Calculators/A&P                          Patient is a 54 year old female with a history and physical exam as noted above.  Patient's lab work, vital signs, chest x-ray, ECG are reassuring.  Negative troponin x3.  No elevation in D-dimer.  Patient is not tachycardic.  She is saturating well on room air.  No acute findings on chest x-ray.  I discussed this with the patient. She was also evaluated by my attending physician Dr. Quintella Reichert.  Recommended that she follow-up with her PCP to discuss her symptoms at this visit.  Patient understands to return to the emergency department if she develops any new or worsening symptoms.  Her questions were answered and she was amicable to time of discharge.  Her vital signs are stable  Patient discharged to home/self care.  Condition at discharge: Stable  Note: Portions of this report may have been transcribed using voice recognition software. Every effort was made to ensure  accuracy; however, inadvertent computerized transcription errors may be present.    Final Clinical Impression(s) / ED Diagnoses Final diagnoses:  Chest pain, unspecified type   Rx / DC Orders ED Discharge Orders    None       Moody Bruins 09/13/19 2013    Quintella Reichert, MD 09/13/19 [id number]

## 2019-09-13 NOTE — ED Notes (Signed)
EDP at bedside  

## 2019-09-25 ENCOUNTER — Ambulatory Visit: Payer: 59 | Admitting: Gastroenterology

## 2019-10-18 ENCOUNTER — Telehealth: Payer: Self-pay | Admitting: Internal Medicine

## 2019-10-18 NOTE — Telephone Encounter (Signed)
Rejection Reason - Patient Declined" [facility] said 4 days ago

## 2019-12-11 ENCOUNTER — Other Ambulatory Visit: Payer: Self-pay | Admitting: Internal Medicine

## 2019-12-20 ENCOUNTER — Other Ambulatory Visit: Payer: Self-pay | Admitting: Internal Medicine

## 2020-02-25 ENCOUNTER — Other Ambulatory Visit: Payer: Self-pay | Admitting: Internal Medicine

## 2020-02-25 NOTE — Telephone Encounter (Signed)
Pt called to follow up on refill.. she is going out of town tomorrow and needs this done today

## 2020-04-03 ENCOUNTER — Other Ambulatory Visit: Payer: Self-pay | Admitting: Internal Medicine

## 2020-04-03 NOTE — Telephone Encounter (Signed)
Patient called in for refill for valsartan-hydrochlorothiazide (DIOVAN-HCT) 160-12.5 MG tablet and meloxicam (MOBIC) 7.5 MG tablet

## 2020-04-22 ENCOUNTER — Other Ambulatory Visit: Payer: Self-pay | Admitting: Orthopaedic Surgery

## 2020-04-22 DIAGNOSIS — Z96641 Presence of right artificial hip joint: Secondary | ICD-10-CM

## 2020-04-30 ENCOUNTER — Other Ambulatory Visit: Payer: Self-pay | Admitting: Internal Medicine

## 2020-06-17 ENCOUNTER — Ambulatory Visit: Payer: 59 | Admitting: Internal Medicine

## 2020-06-17 ENCOUNTER — Ambulatory Visit
Admission: RE | Admit: 2020-06-17 | Discharge: 2020-06-17 | Disposition: A | Discharge status: Home / Self Care | Payer: 59 | Source: Ambulatory Visit | Attending: Internal Medicine | Admitting: Internal Medicine

## 2020-06-17 ENCOUNTER — Other Ambulatory Visit: Payer: Self-pay

## 2020-06-17 ENCOUNTER — Encounter: Payer: Self-pay | Admitting: Internal Medicine

## 2020-06-17 ENCOUNTER — Other Ambulatory Visit: Payer: Self-pay | Admitting: Internal Medicine

## 2020-06-17 VITALS — BP 134/92 | HR 82 | Temp 98.7°F | Resp 15 | Ht 63.5 in | Wt 196.4 lb

## 2020-06-17 DIAGNOSIS — M7989 Other specified soft tissue disorders: Secondary | ICD-10-CM | POA: Diagnosis not present

## 2020-06-17 DIAGNOSIS — E66811 Obesity, class 1: Secondary | ICD-10-CM

## 2020-06-17 DIAGNOSIS — M1612 Unilateral primary osteoarthritis, left hip: Secondary | ICD-10-CM | POA: Diagnosis not present

## 2020-06-17 DIAGNOSIS — R600 Localized edema: Secondary | ICD-10-CM

## 2020-06-17 DIAGNOSIS — I10 Essential (primary) hypertension: Secondary | ICD-10-CM | POA: Diagnosis not present

## 2020-06-17 DIAGNOSIS — E6609 Other obesity due to excess calories: Secondary | ICD-10-CM

## 2020-06-17 DIAGNOSIS — R635 Abnormal weight gain: Secondary | ICD-10-CM | POA: Diagnosis not present

## 2020-06-17 DIAGNOSIS — Z6833 Body mass index (BMI) 33.0-33.9, adult: Secondary | ICD-10-CM

## 2020-06-17 MED ORDER — FUROSEMIDE 20 MG PO TABS: 20.0000 mg | ORAL_TABLET | Freq: Every day | ORAL | 3 refills | Status: DC | PRN

## 2020-06-17 NOTE — Patient Instructions (Signed)
We need to rule out a DVT before we address any of your other issues  I have ordered an ultrasound of each leg to do this  I have also prescribed a fluid pill called furosemide that can be used AS NEEDED for fluid retention   Labs today do NOT rule out DVT:  They will look for other contributors to the weight gain /fluid gain   RTC one week   to address other concerns

## 2020-06-17 NOTE — Telephone Encounter (Signed)
Last Appointment was a no show and no OV since 6/21, Plus has Appointment today please advise ok to fill meloxicam?

## 2020-06-17 NOTE — Progress Notes (Signed)
Subjective:  Patient ID: Paula Moses, female    DOB: Mar 17, 1965  Age: 55 y.o. MRN: [id number]  CC: The primary encounter diagnosis was Arthritis of left hip. Diagnoses of Leg swelling, Weight gain, Primary hypertension, Bilateral leg edema, and Class 1 obesity due to excess calories without serious comorbidity with body mass index (BMI) of 33.0 to 33.9 in adult were also pertinent to this visit.  HPI Paula Moses presents for follow up on hypertension and other issues  This visit occurred during the SARS-CoV-2 public health emergency.  Safety protocols were in place, including screening questions prior to the visit, additional usage of staff PPE, and extensive cleaning of exam room while observing appropriate contact time as indicated for disinfecting solutions.    Hypertension: patient checks blood pressure twice weekly at home.  Readings have been for the most part > 130 to 152 /84 to 92  at rest for the last several weeks sice returning to [place]  . Patient is following a reduce salt diet most days and is taking medications as prescribed,,    During a trip to [place] March  23 to 28 she developed significant bilateral  LE pitting edema  to the knees  On day 2 of trip.  calves were so swollen they were Painful.. patient takes HRT (estrogen pellets,  Progesterone tabs )    Has had a Weight gain of 10 lbs in the last month .  Ate 3 meals daily during [place] vacation. Usually eats 2 (intermittent fasting) meals daily, whatever she wants. Feels like her midsection has really increased in  girth,  Has been retaining fluid ., sometimes hard to catch her breath Denies dyspnea but was short of breath going up a several flights of a stadium to attend a concert.    Exercising several days per week with weights and cardio but gaining weight.   Polyarthralgia .  Managed with meloxicam.  Hip replacement . Has had right  Knee pain for several weeks,  Fingers hurt.     depression screen : fatigue and  decreased concentration  Outpatient Medications Prior to Visit  Medication Sig Dispense Refill  . Azelastine HCl 137 MCG/SPRAY SOLN Place 2 sprays into both nostrils 2 (two) times daily.    . Biotin w/ Vitamins C & E (HAIR/SKIN/NAILS PO) Take 2 tablets by mouth every evening.    . dibucaine (NUPERCAINAL) 1 % OINT Place 1 application rectally as needed for hemorrhoids. 28 g 1  . estradiol (CLIMARA - DOSED IN MG/24 HR) 0.05 mg/24hr patch Place 0.05 mg onto the skin once a week.    . fluticasone (FLONASE) 50 MCG/ACT nasal spray SPRAY TWICE IN EACH NOSTRIL ONCE DAILY    . gabapentin (NEURONTIN) 300 MG capsule Take 2 capsules (600 mg total) by mouth 3 (three) times daily. 3090 capsule 3  . hydrocortisone-pramoxine (PROCTOFOAM-HC) rectal foam Place 1 applicator rectally 2 (two) times daily. 10 g 1  . montelukast (SINGULAIR) 10 MG tablet Take 10 mg by mouth at bedtime.    Marland Kitchen omeprazole (PRILOSEC OTC) 20 MG tablet Take 20 mg by mouth daily as needed (acid reflux).    Marland Kitchen OVER THE COUNTER MEDICATION Take 3 tablets by mouth 2 (two) times daily. Fiber Well Gummies    . rizatriptan (MAXALT) 5 MG tablet Take 5 mg by mouth every 2 (two) hours as needed for migraine.  0  . valsartan-hydrochlorothiazide (DIOVAN-HCT) 160-12.5 MG tablet Take 1 tablet by mouth once daily 90 tablet 0  .  meloxicam (MOBIC) 7.5 MG tablet Take 1 tablet by mouth once daily 30 tablet 0  . PROGESTERONE PO TAKE 1 CAPSULE BY MOUTH AT BEDTIME. IF NOT HELPING WITH SLEEP, TAKE 2 CAPSULES AT BEDTIME.    Marland Kitchen estradiol (ESTRACE) 2 MG tablet Take 2 mg by mouth at bedtime.  (Patient not taking: Reported on 06/17/2020)    . phentermine (ADIPEX-P) 37.5 MG tablet Take 1 tablet (37.5 mg total) by mouth daily before breakfast. (Patient not taking: Reported on 06/17/2020) 90 tablet 0   Facility-Administered Medications Prior to Visit  Medication Dose Route Frequency Provider Last Rate Last Admin  . hyoscyamine (ANASPAZ) disintergrating tablet 0.125 mg  0.125 mg  Sublingual Q6H PRN Marval Regal, NP        Review of Systems;  Patient denies headache, fevers, malaise, unintentional weight loss, skin rash, eye pain, sinus congestion and sinus pain, sore throat, dysphagia,  hemoptysis , cough, dyspnea, wheezing, chest pain, palpitations, orthopnea, edema, abdominal pain, nausea, melena, diarrhea, constipation, flank pain, dysuria, hematuria, urinary  Frequency, nocturia, numbness, tingling, seizures,  Focal weakness, Loss of consciousness,  Tremor, insomnia, depression, anxiety, and suicidal ideation.      Objective:  BP (!) 134/92 (BP Location: Left Arm, Patient Position: Sitting, Cuff Size: Large)   Pulse 82   Temp 98.7 F (37.1 C) (Oral)   Resp 15   Ht 5' 3.5" (1.613 m)   Wt 196 lb 6.4 oz (89.1 kg)   SpO2 97%   BMI 34.24 kg/m   BP Readings from Last 3 Encounters:  06/17/20 (!) 134/92  09/13/19 119/72  09/04/19 124/70    Wt Readings from Last 3 Encounters:  06/17/20 196 lb 6.4 oz (89.1 kg)  09/13/19 188 lb (85.3 kg)  09/04/19 193 lb (87.5 kg)    General appearance: alert, cooperative and appears stated age Ears: normal TM's and external ear canals both ears Throat: lips, mucosa, and tongue normal; teeth and gums normal Neck: no adenopathy, no carotid bruit, supple, symmetrical, trachea midline and thyroid not enlarged, symmetric, no tenderness/mass/nodules Back: symmetric, no curvature. ROM normal. No CVA tenderness. Lungs: clear to auscultation bilaterally Heart: regular rate and rhythm, S1, S2 normal, no murmur, click, rub or gallop Abdomen: soft, non-tender; bowel sounds normal; no masses,  no organomegaly Pulses: 2+ and symmetric Skin: Skin color, texture, turgor normal. No rashes or lesions Ext: bilateral pitting edema,  L > R  Lymph nodes: Cervical, supraclavicular, and axillary nodes normal.  No results found for: HGBA1C  Lab Results  Component Value Date   CREATININE 0.89 06/17/2020   CREATININE 0.94 09/13/2019    CREATININE 0.77 09/04/2019    Lab Results  Component Value Date   WBC 6.8 09/13/2019   HGB 13.2 09/13/2019   HCT 40.0 09/13/2019   PLT 257 09/13/2019   GLUCOSE 109 (H) 06/17/2020   CHOL 252 (H) 01/18/2019   TRIG 238.0 (H) 01/18/2019   HDL 56.90 01/18/2019   LDLDIRECT 145.0 01/18/2019   ALT 30 06/17/2020   AST 21 06/17/2020   NA 135 06/17/2020   K 4.3 06/17/2020   CL 99 06/17/2020   CREATININE 0.89 06/17/2020   BUN 13 06/17/2020   CO2 27 06/17/2020   TSH 3.57 06/17/2020   INR 0.96 10/25/2017   MICROALBUR <0.7 06/17/2020    DG Chest 2 View  Result Date: 09/13/2019 CLINICAL DATA:  Chest pain radiating to the left arm, dyspnea EXAM: CHEST - 2 VIEW COMPARISON:  None. FINDINGS: The heart size and mediastinal contours  are within normal limits. Both lungs are clear. The visualized skeletal structures are unremarkable. IMPRESSION: No active cardiopulmonary disease. Electronically Signed   By: Fidela Salisbury MD   On: 09/13/2019 13:26    Assessment & Plan:   Problem List Items Addressed This Visit      Unprioritized   Arthritis of left hip - Primary   Bilateral leg edema    STAT LE ultrasounds were done to rule out DVT .  Urinalysis was negative for proteinuria. Prn furosemide prescribed,  Return for follow up on hypertension and rule out CVI      Hypertension   Relevant Medications   furosemide (LASIX) 20 MG tablet   Other Relevant Orders   Microalbumin / creatinine urine ratio (Completed)   Obesity    She is exercising regularly but gaining weight. Screening labs normal.  Will discuss diet  Lab Results  Component Value Date   TSH 3.57 06/17/2020   Lab Results  Component Value Date   ALT 30 06/17/2020   AST 21 06/17/2020   ALKPHOS 42 06/17/2020   BILITOT 0.4 06/17/2020          Other Visit Diagnoses    Leg swelling       Relevant Medications   furosemide (LASIX) 20 MG tablet   Other Relevant Orders   D-Dimer, Quantitative (Completed)   US Venous Img Lower  Bilateral (Completed)   Weight gain       Relevant Orders   Comprehensive metabolic panel (Completed)   TSH (Completed)      I spent 30 mintutes dedicated to the care of this patient on the date of this encounter to include pre-visit review of her medical history,  Face-to-face time with the patient , and post visit ordering of testing and therapeutics.  I have discontinued Tanaiya L. Strehl's phentermine. I am also having her start on furosemide. Additionally, I am having her maintain her montelukast, omeprazole, Biotin w/ Vitamins C & E (HAIR/SKIN/NAILS PO), rizatriptan, OVER THE COUNTER MEDICATION, gabapentin, Azelastine HCl, fluticasone, hydrocortisone-pramoxine, dibucaine, valsartan-hydrochlorothiazide, estradiol, and PROGESTERONE PO. We will continue to administer hyoscyamine.  Meds ordered this encounter  Medications  . furosemide (LASIX) 20 MG tablet    Sig: Take 1 tablet (20 mg total) by mouth daily as needed. For fluid retention    Dispense:  30 tablet    Refill:  3    Medications Discontinued During This Encounter  Medication Reason  . phentermine (ADIPEX-P) 37.5 MG tablet   . estradiol (ESTRACE) 2 MG tablet     Follow-up: Return in about 1 week (around 06/24/2020).   [name], MD

## 2020-06-18 DIAGNOSIS — R600 Localized edema: Secondary | ICD-10-CM | POA: Insufficient documentation

## 2020-06-18 LAB — COMPREHENSIVE METABOLIC PANEL
ALT: 30 U/L (ref 0–35)
AST: 21 U/L (ref 0–37)
Albumin: 4.4 g/dL (ref 3.5–5.2)
Alkaline Phosphatase: 42 U/L (ref 39–117)
BUN: 13 mg/dL (ref 6–23)
CO2: 27 mEq/L (ref 19–32)
Calcium: 10 mg/dL (ref 8.4–10.5)
Chloride: 99 mEq/L (ref 96–112)
Creatinine, Ser: 0.89 mg/dL (ref 0.40–1.20)
GFR: 74.52 mL/min (ref >60.00)
Glucose, Bld: 109 mg/dL — ABNORMAL HIGH (ref 70–99)
Potassium: 4.3 mEq/L (ref 3.5–5.1)
Sodium: 135 mEq/L (ref 135–145)
Total Bilirubin: 0.4 mg/dL (ref 0.2–1.2)
Total Protein: 7.4 g/dL (ref 6.0–8.3)

## 2020-06-18 LAB — MICROALBUMIN / CREATININE URINE RATIO
Creatinine,U: 107.1 mg/dL
Microalb Creat Ratio: 0.7 mg/g (ref 0.0–30.0)
Microalb, Ur: <0.7 mg/dL (ref 0.0–1.9)

## 2020-06-18 LAB — D-DIMER, QUANTITATIVE: D-Dimer, Quant: 0.26 mcg/mL FEU (ref <0.50)

## 2020-06-18 LAB — TSH: TSH: 3.57 u[IU]/mL (ref 0.35–4.50)

## 2020-06-18 NOTE — Assessment & Plan Note (Signed)
STAT LE ultrasounds were done to rule out DVT .  Urinalysis was negative for proteinuria. Prn furosemide prescribed,  Return for follow up on hypertension and rule out CVI

## 2020-06-18 NOTE — Assessment & Plan Note (Signed)
She is exercising regularly but gaining weight. Screening labs normal.  Will discuss diet  Lab Results  Component Value Date   TSH 3.57 06/17/2020   Lab Results  Component Value Date   ALT 30 06/17/2020   AST 21 06/17/2020   ALKPHOS 42 06/17/2020   BILITOT 0.4 06/17/2020

## 2020-06-24 ENCOUNTER — Telehealth: Payer: Self-pay | Admitting: Internal Medicine

## 2020-06-24 ENCOUNTER — Ambulatory Visit (INDEPENDENT_AMBULATORY_CARE_PROVIDER_SITE_OTHER): Payer: 59 | Admitting: Internal Medicine

## 2020-06-24 ENCOUNTER — Other Ambulatory Visit: Payer: Self-pay

## 2020-06-24 ENCOUNTER — Encounter: Payer: Self-pay | Admitting: Internal Medicine

## 2020-06-24 VITALS — BP 108/78 | HR 72 | Temp 97.7°F | Resp 14 | Ht 63.5 in | Wt 192.2 lb

## 2020-06-24 DIAGNOSIS — E669 Obesity, unspecified: Secondary | ICD-10-CM | POA: Diagnosis not present

## 2020-06-24 DIAGNOSIS — Z6833 Body mass index (BMI) 33.0-33.9, adult: Secondary | ICD-10-CM | POA: Diagnosis not present

## 2020-06-24 DIAGNOSIS — I708 Atherosclerosis of other arteries: Secondary | ICD-10-CM

## 2020-06-24 DIAGNOSIS — K760 Fatty (change of) liver, not elsewhere classified: Secondary | ICD-10-CM | POA: Diagnosis not present

## 2020-06-24 DIAGNOSIS — M25561 Pain in right knee: Secondary | ICD-10-CM

## 2020-06-24 DIAGNOSIS — R41840 Attention and concentration deficit: Secondary | ICD-10-CM | POA: Insufficient documentation

## 2020-06-24 DIAGNOSIS — I10 Essential (primary) hypertension: Secondary | ICD-10-CM

## 2020-06-24 DIAGNOSIS — R140 Abdominal distension (gaseous): Secondary | ICD-10-CM | POA: Insufficient documentation

## 2020-06-24 LAB — COMPREHENSIVE METABOLIC PANEL
ALT: 26 U/L (ref 0–35)
AST: 21 U/L (ref 0–37)
Albumin: 4.2 g/dL (ref 3.5–5.2)
Alkaline Phosphatase: 37 U/L — ABNORMAL LOW (ref 39–117)
BUN: 15 mg/dL (ref 6–23)
CO2: 29 mEq/L (ref 19–32)
Calcium: 9.7 mg/dL (ref 8.4–10.5)
Chloride: 99 mEq/L (ref 96–112)
Creatinine, Ser: 1.03 mg/dL (ref 0.40–1.20)
GFR: 62.53 mL/min (ref >60.00)
Glucose, Bld: 85 mg/dL (ref 70–99)
Potassium: 3.9 mEq/L (ref 3.5–5.1)
Sodium: 135 mEq/L (ref 135–145)
Total Bilirubin: 0.4 mg/dL (ref 0.2–1.2)
Total Protein: 7.7 g/dL (ref 6.0–8.3)

## 2020-06-24 LAB — LIPID PANEL
Cholesterol: 218 mg/dL — ABNORMAL HIGH (ref 0–200)
HDL: 37.5 mg/dL — ABNORMAL LOW (ref >39.00)
NonHDL: 180.32
Total CHOL/HDL Ratio: 6
Triglycerides: 225 mg/dL — ABNORMAL HIGH (ref 0.0–149.0)
VLDL: 45 mg/dL — ABNORMAL HIGH (ref 0.0–40.0)

## 2020-06-24 LAB — IBC + FERRITIN
Ferritin: 24.5 ng/mL (ref 10.0–291.0)
Iron: 92 ug/dL (ref 42–145)
Saturation Ratios: 25.5 % (ref 20.0–50.0)
Transferrin: 258 mg/dL (ref 212.0–360.0)

## 2020-06-24 LAB — HEPATIC FUNCTION PANEL
ALT: 26 U/L (ref 0–35)
AST: 21 U/L (ref 0–37)
Albumin: 4.2 g/dL (ref 3.5–5.2)
Alkaline Phosphatase: 37 U/L — ABNORMAL LOW (ref 39–117)
Bilirubin, Direct: 0.1 mg/dL (ref 0.0–0.3)
Total Bilirubin: 0.4 mg/dL (ref 0.2–1.2)
Total Protein: 7.7 g/dL (ref 6.0–8.3)

## 2020-06-24 LAB — HEMOGLOBIN A1C: Hgb A1c MFr Bld: 6 % (ref 4.6–6.5)

## 2020-06-24 LAB — LDL CHOLESTEROL, DIRECT: Direct LDL: 157 mg/dL

## 2020-06-24 NOTE — Assessment & Plan Note (Signed)
Aortic atherosclerosis :  Discussed need for statin therapy given documented evidence of iliac  atherosclerosis  noted on recent  CT of abdomen and  pelvis and the prognostic implications of this finding.   basleline lipids needed and drawn today

## 2020-06-24 NOTE — Assessment & Plan Note (Signed)
I have addressed  BMI and recommended a Mediterranean style low glycemic index diet.   I have also recommended that patient continue  exercising with a goal of 30 minutes of aerobic exercise a minimum of 5 days per week. Screening for lipid disorders, thyroid and diabetes to be done today.

## 2020-06-24 NOTE — Assessment & Plan Note (Signed)
Suggested by Jan 2021 CT. Screening for autoimmune/viral causes  In process

## 2020-06-24 NOTE — Assessment & Plan Note (Signed)
Well controlled on current regimen of valsartan/hct and borderline low with addition of lasix.  Stopping lasix.

## 2020-06-24 NOTE — Assessment & Plan Note (Signed)
Patient has gained weight in the last month in her abdominal area and is worried about a mass since she is actively trying to lose weight.  abd [place] ordered to evaluate for mass/ascites given potential diagnosis of hepatic steatosis by CT in 2021 . If [place] is negative for mass,  She is receptive to the idea of central  and/or visceral  adiposity creating an "apple"shape and the metabolic milieu and increased risk for cardiac events

## 2020-06-24 NOTE — Patient Instructions (Signed)
Fasting labs and additional labs to rule out other causes of fatty liver  Abdominal ultrasound ordered   Referral for ADD testing in process    Fatty Liver Disease  The liver converts food into energy, removes toxic material from the blood, makes important proteins, and absorbs necessary vitamins from food. Fatty liver disease occurs when too much fat has built up in your liver cells. Fatty liver disease is also called hepatic steatosis. In many cases, fatty liver disease does not cause symptoms or problems. It is often diagnosed when tests are being done for other reasons. However, over time, fatty liver can cause inflammation that may lead to more serious liver problems, such as scarring of the liver (cirrhosis) and liver failure. Fatty liver is associated with insulin resistance, increased body fat, high blood pressure (hypertension), and high cholesterol. These are features of metabolic syndrome and increase your risk for stroke, diabetes, and heart disease. What are the causes? This condition may be caused by components of metabolic syndrome:  Obesity.  Insulin resistance.  High cholesterol. Other causes:  Alcohol abuse.  Poor nutrition.  Cushing syndrome.  Pregnancy.  Certain drugs.  Poisons.  Some viral infections. What increases the risk? You are more likely to develop this condition if you:  Abuse alcohol.  Are overweight.  Have diabetes.  Have hepatitis.  Have a high triglyceride level.  Are pregnant. What are the signs or symptoms? Fatty liver disease often does not cause symptoms. If symptoms do develop, they can include:  Fatigue and weakness.  Weight loss.  Confusion.  Nausea, vomiting, or abdominal pain.  Yellowing of your skin and the white parts of your eyes (jaundice).  Itchy skin. How is this diagnosed? This condition may be diagnosed by:  A physical exam and your medical history.  Blood tests.  Imaging tests, such as an  ultrasound, CT scan, or MRI.  A liver biopsy. A small sample of liver tissue is removed using a needle. The sample is then looked at under a microscope. How is this treated? Fatty liver disease is often caused by other health conditions. Treatment for fatty liver may involve medicines and lifestyle changes to manage conditions such as:  Alcoholism.  High cholesterol.  Diabetes.  Being overweight or obese. Follow these instructions at home:  Do not drink alcohol. If you have trouble quitting, ask your health care provider how to safely quit with the help of medicine or a supervised program. This is important to keep your condition from getting worse.  Eat a healthy diet as told by your health care provider. Ask your health care provider about working with a dietitian to develop an eating plan.  Exercise regularly. This can help you lose weight and control your cholesterol and diabetes. Talk to your health care provider about an exercise plan and which activities are best for you.  Take over-the-counter and prescription medicines only as told by your health care provider.  Keep all follow-up visits. This is important.   Contact a health care provider if:  You have trouble controlling your: ? Blood sugar. This is especially important if you have diabetes. ? Cholesterol. ? Drinking of alcohol. Get help right away if:  You have abdominal pain.  You have jaundice.  You have nausea and are vomiting.  You vomit blood or material that looks like coffee grounds.  You have stools that are black, tar-like, or bloody. Summary  Fatty liver disease develops when too much fat builds up in the cells  of your liver.  Fatty liver disease often causes no symptoms or problems. However, over time, fatty liver can cause inflammation that may lead to more serious liver problems, such as scarring of the liver (cirrhosis).  You are more likely to develop this condition if you abuse alcohol, are  pregnant, are overweight, have diabetes, have hepatitis, or have high triglyceride or cholesterol levels.  Contact your health care provider if you have trouble controlling your blood sugar, cholesterol, or drinking of alcohol. This information is not intended to replace advice given to you by your health care provider. Make sure you discuss any questions you have with your health care provider. Document Revised: 12/12/2019 Document Reviewed: 12/12/2019 Elsevier Patient Education  [place].

## 2020-06-24 NOTE — Assessment & Plan Note (Signed)
Referring to Behavioral health for formal testing for ADD..  If negative,  Will initiate trial of wellbutrin

## 2020-06-24 NOTE — Assessment & Plan Note (Signed)
Persistent since a fall in December which resulted in a direct blow to the patella . Referral to Charlann Boxer

## 2020-06-24 NOTE — Telephone Encounter (Signed)
lft vm for pt to call ofc to sch US abdomen.thanks 
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
## 2020-06-25 NOTE — Progress Notes (Deleted)
Subjective:    I'm seeing this patient as a consultation for:  Dr. Derrel Nip. Note will be routed back to referring provider/PCP.  CC: R knee pain  I, Trystan Eads, LAT, ATC, am serving as scribe for Dr. Lynne Leader.  HPI: Pt is a 55 y/o female presenting w/ c/o R post knee pain after suffering a fall in Dec and landed on her ant knee.  She locates the pain to .  Radiating pain: R knee swelling: R knee mechanical symptoms: Aggravating factors: R knee flexion; squatting:  Treatments tried:   Past medical history, Surgical history, Family history, Social history, Allergies, and medications have been entered into the medical record, reviewed. ***  Review of Systems: No new headache, visual changes, nausea, vomiting, diarrhea, constipation, dizziness, abdominal pain, skin rash, fevers, chills, night sweats, weight loss, swollen lymph nodes, body aches, joint swelling, muscle aches, chest pain, shortness of breath, mood changes, visual or auditory hallucinations.   Objective:   There were no vitals filed for this visit. General: Well Developed, well nourished, and in no acute distress.  Neuro/Psych: Alert and oriented x3, extra-ocular muscles intact, able to move all 4 extremities, sensation grossly intact. Skin: Warm and dry, no rashes noted.  Respiratory: Not using accessory muscles, speaking in full sentences, trachea midline.  Cardiovascular: Pulses palpable, no extremity edema. Abdomen: Does not appear distended. MSK: ***  Lab and Radiology Results Results for orders placed or performed in visit on 06/24/20 (from the past 72 hour(s))  IBC + Ferritin     Status: None   Collection Time: 06/24/20 12:49 PM  Result Value Ref Range   Iron 92 42 - 145 ug/dL   Transferrin 258.0 212.0 - 360.0 mg/dL   Saturation Ratios 25.5 20.0 - 50.0 %   Ferritin 24.5 10.0 - 291.0 ng/mL  Hepatic function panel     Status: Abnormal   Collection Time: 06/24/20 12:49 PM  Result Value Ref Range   Total  Bilirubin 0.4 0.2 - 1.2 mg/dL   Bilirubin, Direct 0.1 0.0 - 0.3 mg/dL   Alkaline Phosphatase 37 (L) 39 - 117 U/L   AST 21 0 - 37 U/L   ALT 26 0 - 35 U/L   Total Protein 7.7 6.0 - 8.3 g/dL   Albumin 4.2 3.5 - 5.2 g/dL  Lipid panel     Status: Abnormal   Collection Time: 06/24/20 12:49 PM  Result Value Ref Range   Cholesterol 218 (H) 0 - 200 mg/dL    Comment: ATP III Classification       Desirable:  < 200 mg/dL               Borderline High:  200 - 239 mg/dL          High:  > = 240 mg/dL   Triglycerides 225.0 (H) 0.0 - 149.0 mg/dL    Comment: Normal:  <150 mg/dLBorderline High:  150 - 199 mg/dL   HDL 37.50 (L) >39.00 mg/dL   VLDL 45.0 (H) 0.0 - 40.0 mg/dL   Total CHOL/HDL Ratio 6     Comment:                Men          Women1/2 Average Risk     3.4          3.3Average Risk          5.0          4.42X Average Risk  9.6          7.13X Average Risk          15.0          11.0                       NonHDL 180.32     Comment: NOTE:  Non-HDL goal should be 30 mg/dL higher than patient's LDL goal (i.e. LDL goal of < 70 mg/dL, would have non-HDL goal of < 100 mg/dL)  Hemoglobin A1c     Status: None   Collection Time: 06/24/20 12:49 PM  Result Value Ref Range   Hgb A1c MFr Bld 6.0 4.6 - 6.5 %    Comment: Glycemic Control Guidelines for People with Diabetes:Non Diabetic:  <6%Goal of Therapy: <7%Additional Action Suggested:  >8%   Comprehensive metabolic panel     Status: Abnormal   Collection Time: 06/24/20 12:49 PM  Result Value Ref Range   Sodium 135 135 - 145 mEq/L   Potassium 3.9 3.5 - 5.1 mEq/L   Chloride 99 96 - 112 mEq/L   CO2 29 19 - 32 mEq/L   Glucose, Bld 85 70 - 99 mg/dL   BUN 15 6 - 23 mg/dL   Creatinine, Ser 1.03 0.40 - 1.20 mg/dL   Total Bilirubin 0.4 0.2 - 1.2 mg/dL   Alkaline Phosphatase 37 (L) 39 - 117 U/L   AST 21 0 - 37 U/L   ALT 26 0 - 35 U/L   Total Protein 7.7 6.0 - 8.3 g/dL   Albumin 4.2 3.5 - 5.2 g/dL   GFR 62.53 >60.00 mL/min    Comment: Calculated  using the CKD-EPI Creatinine Equation (2021)   Calcium 9.7 8.4 - 10.5 mg/dL  LDL cholesterol, direct     Status: None   Collection Time: 06/24/20 12:49 PM  Result Value Ref Range   Direct LDL 157.0 mg/dL    Comment: Optimal:  <100 mg/dLNear or Above Optimal:  100-129 mg/dLBorderline High:  130-159 mg/dLHigh:  160-189 mg/dLVery High:  >190 mg/dL   No results found.  Impression and Recommendations:    Assessment and Plan: 55 y.o. female with ***.  PDMP not reviewed this encounter. No orders of the defined types were placed in this encounter.  No orders of the defined types were placed in this encounter.   Discussed warning signs or symptoms. Please see discharge instructions. Patient expresses understanding.   ***

## 2020-06-28 LAB — ANTI-SMOOTH MUSCLE ANTIBODY, IGG: Actin (Smooth Muscle) Antibody (IGG): <20 U (ref <20)

## 2020-06-28 LAB — MITOCHONDRIAL ANTIBODIES: Mitochondrial M2 Ab, IgG: <=20 U

## 2020-06-28 LAB — HEPATITIS C ANTIBODY
Hepatitis C Ab: NONREACTIVE
SIGNAL TO CUT-OFF: 0.01 (ref <1.00)

## 2020-06-28 LAB — ANA: Anti Nuclear Antibody (ANA): NEGATIVE

## 2020-06-28 LAB — ANTI-MICROSOMAL ANTIBODY LIVER / KIDNEY: LKM1 Ab: <=20 U (ref <=20.0)

## 2020-06-28 LAB — CERULOPLASMIN: Ceruloplasmin: 30 mg/dL (ref 18–53)

## 2020-06-28 LAB — HEPATITIS B SURFACE ANTIGEN: Hepatitis B Surface Ag: NONREACTIVE

## 2020-06-28 LAB — HEPATITIS B SURFACE ANTIBODY,QUALITATIVE: Hep B S Ab: NONREACTIVE

## 2020-06-28 LAB — ANTI-SMITH ANTIBODY: ENA SM Ab Ser-aCnc: <1 AI

## 2020-06-28 LAB — HEPATITIS B CORE ANTIBODY, TOTAL: Hep B Core Total Ab: NONREACTIVE

## 2020-06-29 ENCOUNTER — Ambulatory Visit: Payer: 59 | Admitting: Family Medicine

## 2020-06-29 ENCOUNTER — Ambulatory Visit (HOSPITAL_BASED_OUTPATIENT_CLINIC_OR_DEPARTMENT_OTHER)
Admission: RE | Admit: 2020-06-29 | Discharge: 2020-06-29 | Disposition: A | Discharge status: Home / Self Care | Payer: 59 | Source: Ambulatory Visit | Attending: Internal Medicine | Admitting: Internal Medicine

## 2020-06-29 ENCOUNTER — Other Ambulatory Visit: Payer: Self-pay

## 2020-06-29 DIAGNOSIS — R140 Abdominal distension (gaseous): Secondary | ICD-10-CM | POA: Insufficient documentation

## 2020-06-30 ENCOUNTER — Telehealth: Payer: Self-pay

## 2020-06-30 ENCOUNTER — Other Ambulatory Visit: Payer: Self-pay | Admitting: Internal Medicine

## 2020-06-30 DIAGNOSIS — R41840 Attention and concentration deficit: Secondary | ICD-10-CM

## 2020-06-30 NOTE — Telephone Encounter (Signed)
Pt called about referral for ADD. She wanted the number but I did not see the name of the place. Please advise

## 2020-07-01 NOTE — Telephone Encounter (Signed)
Spoke with pt and informed her that the original referral had to changed and that the new referral so placed yesterday. Told pt that if she does not hear anything about the new referral by next Tuesday to please give our office a call back so we can check on the referral. Pt gave a verbal understanding.

## 2020-07-02 ENCOUNTER — Other Ambulatory Visit: Payer: Self-pay | Admitting: Internal Medicine

## 2020-07-02 DIAGNOSIS — E663 Overweight: Secondary | ICD-10-CM

## 2020-07-02 MED ORDER — SEMAGLUTIDE-WEIGHT MANAGEMENT 1.7 MG/0.75ML ~~LOC~~ SOAJ: 1.7000 mg | SUBCUTANEOUS | 0 refills | Status: DC

## 2020-07-02 MED ORDER — BUPROPION HCL ER (XL) 150 MG PO TB24: 150.0000 mg | ORAL_TABLET | Freq: Every day | ORAL | 5 refills | Status: DC

## 2020-07-02 MED ORDER — SEMAGLUTIDE-WEIGHT MANAGEMENT 1 MG/0.5ML ~~LOC~~ SOAJ: 1.0000 mg | SUBCUTANEOUS | 0 refills | Status: DC

## 2020-07-02 MED ORDER — SEMAGLUTIDE-WEIGHT MANAGEMENT 0.25 MG/0.5ML ~~LOC~~ SOAJ: 0.2500 mg | SUBCUTANEOUS | 0 refills | Status: DC

## 2020-07-02 MED ORDER — SEMAGLUTIDE-WEIGHT MANAGEMENT 2.4 MG/0.75ML ~~LOC~~ SOAJ: 2.4000 mg | SUBCUTANEOUS | 0 refills | Status: DC

## 2020-07-02 MED ORDER — SEMAGLUTIDE-WEIGHT MANAGEMENT 0.5 MG/0.5ML ~~LOC~~ SOAJ: 0.5000 mg | SUBCUTANEOUS | 0 refills | Status: DC

## 2020-07-06 ENCOUNTER — Other Ambulatory Visit: Payer: Self-pay | Admitting: Internal Medicine

## 2020-07-20 ENCOUNTER — Other Ambulatory Visit: Payer: Self-pay | Admitting: Internal Medicine

## 2020-08-27 ENCOUNTER — Other Ambulatory Visit: Payer: Self-pay | Admitting: Internal Medicine

## 2020-09-08 ENCOUNTER — Emergency Department: Payer: 59

## 2020-09-08 ENCOUNTER — Inpatient Hospital Stay
Admission: EM | Admit: 2020-09-08 | Discharge: 2020-09-11 | DRG: 391 | Disposition: A | Discharge status: Home / Self Care | Payer: 59 | Attending: Internal Medicine | Admitting: Internal Medicine

## 2020-09-08 ENCOUNTER — Telehealth: Payer: Self-pay | Admitting: Internal Medicine

## 2020-09-08 ENCOUNTER — Other Ambulatory Visit: Payer: Self-pay | Admitting: Internal Medicine

## 2020-09-08 ENCOUNTER — Other Ambulatory Visit: Payer: Self-pay

## 2020-09-08 DIAGNOSIS — Z791 Long term (current) use of non-steroidal anti-inflammatories (NSAID): Secondary | ICD-10-CM

## 2020-09-08 DIAGNOSIS — Z886 Allergy status to analgesic agent status: Secondary | ICD-10-CM | POA: Diagnosis not present

## 2020-09-08 DIAGNOSIS — Z87440 Personal history of urinary (tract) infections: Secondary | ICD-10-CM

## 2020-09-08 DIAGNOSIS — R109 Unspecified abdominal pain: Secondary | ICD-10-CM | POA: Diagnosis not present

## 2020-09-08 DIAGNOSIS — D72829 Elevated white blood cell count, unspecified: Secondary | ICD-10-CM

## 2020-09-08 DIAGNOSIS — Z888 Allergy status to other drugs, medicaments and biological substances status: Secondary | ICD-10-CM

## 2020-09-08 DIAGNOSIS — Z808 Family history of malignant neoplasm of other organs or systems: Secondary | ICD-10-CM

## 2020-09-08 DIAGNOSIS — Z96643 Presence of artificial hip joint, bilateral: Secondary | ICD-10-CM | POA: Diagnosis present

## 2020-09-08 DIAGNOSIS — U071 COVID-19: Secondary | ICD-10-CM | POA: Diagnosis present

## 2020-09-08 DIAGNOSIS — E871 Hypo-osmolality and hyponatremia: Secondary | ICD-10-CM | POA: Diagnosis present

## 2020-09-08 DIAGNOSIS — Z885 Allergy status to narcotic agent status: Secondary | ICD-10-CM | POA: Diagnosis not present

## 2020-09-08 DIAGNOSIS — J9601 Acute respiratory failure with hypoxia: Secondary | ICD-10-CM

## 2020-09-08 DIAGNOSIS — Z79899 Other long term (current) drug therapy: Secondary | ICD-10-CM | POA: Diagnosis not present

## 2020-09-08 DIAGNOSIS — Z79890 Hormone replacement therapy (postmenopausal): Secondary | ICD-10-CM | POA: Diagnosis not present

## 2020-09-08 DIAGNOSIS — K219 Gastro-esophageal reflux disease without esophagitis: Secondary | ICD-10-CM

## 2020-09-08 DIAGNOSIS — I10 Essential (primary) hypertension: Secondary | ICD-10-CM | POA: Diagnosis present

## 2020-09-08 DIAGNOSIS — K5732 Diverticulitis of large intestine without perforation or abscess without bleeding: Principal | ICD-10-CM | POA: Diagnosis present

## 2020-09-08 DIAGNOSIS — K5792 Diverticulitis of intestine, part unspecified, without perforation or abscess without bleeding: Secondary | ICD-10-CM | POA: Diagnosis present

## 2020-09-08 DIAGNOSIS — J302 Other seasonal allergic rhinitis: Secondary | ICD-10-CM | POA: Diagnosis present

## 2020-09-08 DIAGNOSIS — R1032 Left lower quadrant pain: Secondary | ICD-10-CM

## 2020-09-08 DIAGNOSIS — F32A Depression, unspecified: Secondary | ICD-10-CM | POA: Diagnosis present

## 2020-09-08 LAB — COMPREHENSIVE METABOLIC PANEL
ALT: 20 U/L (ref 0–44)
AST: 20 U/L (ref 15–41)
Albumin: 4.1 g/dL (ref 3.5–5.0)
Alkaline Phosphatase: 57 U/L (ref 38–126)
Anion gap: 6 (ref 5–15)
BUN: 11 mg/dL (ref 6–20)
CO2: 27 mmol/L (ref 22–32)
Calcium: 8.9 mg/dL (ref 8.9–10.3)
Chloride: 101 mmol/L (ref 98–111)
Creatinine, Ser: 0.9 mg/dL (ref 0.44–1.00)
GFR, Estimated: >60 mL/min (ref >60)
Glucose, Bld: 83 mg/dL (ref 70–99)
Potassium: 4 mmol/L (ref 3.5–5.1)
Sodium: 134 mmol/L — ABNORMAL LOW (ref 135–145)
Total Bilirubin: 0.7 mg/dL (ref 0.3–1.2)
Total Protein: 7.6 g/dL (ref 6.5–8.1)

## 2020-09-08 LAB — RESP PANEL BY RT-PCR (FLU A&B, COVID) ARPGX2
Influenza A by PCR: NEGATIVE
Influenza B by PCR: NEGATIVE
SARS Coronavirus 2 by RT PCR: POSITIVE — AB

## 2020-09-08 LAB — CBC
HCT: 39.5 % (ref 36.0–46.0)
Hemoglobin: 14 g/dL (ref 12.0–15.0)
MCH: 30 pg (ref 26.0–34.0)
MCHC: 35.4 g/dL (ref 30.0–36.0)
MCV: 84.8 fL (ref 80.0–100.0)
Platelets: 259 10*3/uL (ref 150–400)
RBC: 4.66 MIL/uL (ref 3.87–5.11)
RDW: 13.2 % (ref 11.5–15.5)
WBC: 14.1 10*3/uL — ABNORMAL HIGH (ref 4.0–10.5)
nRBC: 0 % (ref 0.0–0.2)

## 2020-09-08 LAB — URINALYSIS, COMPLETE (UACMP) WITH MICROSCOPIC
Bacteria, UA: NONE SEEN
Bilirubin Urine: NEGATIVE
Glucose, UA: NEGATIVE mg/dL
Hgb urine dipstick: NEGATIVE
Ketones, ur: NEGATIVE mg/dL
Leukocytes,Ua: NEGATIVE
Nitrite: NEGATIVE
Protein, ur: NEGATIVE mg/dL
Specific Gravity, Urine: 1.017 (ref 1.005–1.030)
pH: 9 — ABNORMAL HIGH (ref 5.0–8.0)

## 2020-09-08 LAB — LIPASE, BLOOD: Lipase: 36 U/L (ref 11–51)

## 2020-09-08 MED ORDER — SUMATRIPTAN SUCCINATE 50 MG PO TABS
50.0000 mg | ORAL_TABLET | ORAL | Status: DC | PRN
  Filled 2020-09-08: qty 1

## 2020-09-08 MED ORDER — BUPROPION HCL ER (XL) 150 MG PO TB24
150.0000 mg | ORAL_TABLET | Freq: Every day | ORAL | Status: DC
  Filled 2020-09-08 (×2): qty 1

## 2020-09-08 MED ORDER — VITAMIN D 25 MCG (1000 UNIT) PO TABS
1000.0000 [IU] | ORAL_TABLET | Freq: Every day | ORAL | Status: DC
  Filled 2020-09-08: qty 1

## 2020-09-08 MED ORDER — IRBESARTAN 150 MG PO TABS
150.0000 mg | ORAL_TABLET | Freq: Every day | ORAL | Status: DC
  Filled 2020-09-08 (×2): qty 1

## 2020-09-08 MED ORDER — HYDROCHLOROTHIAZIDE 12.5 MG PO CAPS
12.5000 mg | ORAL_CAPSULE | Freq: Every day | ORAL | Status: DC
  Administered 2020-09-09: 12.5 mg via ORAL
  Filled 2020-09-08 (×2): qty 1

## 2020-09-08 MED ORDER — SODIUM CHLORIDE 0.9 % IV SOLN: INTRAVENOUS | Status: DC

## 2020-09-08 MED ORDER — SODIUM CHLORIDE 0.9 % IV SOLN: 100.0000 mg | Freq: Every day | INTRAVENOUS | Status: DC

## 2020-09-08 MED ORDER — METRONIDAZOLE 500 MG/100ML IV SOLN
500.0000 mg | Freq: Once | INTRAVENOUS | Status: AC
  Administered 2020-09-08: 500 mg via INTRAVENOUS
  Filled 2020-09-08: qty 100

## 2020-09-08 MED ORDER — ASCORBIC ACID 500 MG PO TABS
500.0000 mg | ORAL_TABLET | Freq: Every day | ORAL | Status: DC
  Filled 2020-09-08 (×2): qty 1

## 2020-09-08 MED ORDER — METRONIDAZOLE 500 MG/100ML IV SOLN
500.0000 mg | Freq: Three times a day (TID) | INTRAVENOUS | Status: DC
  Administered 2020-09-09 – 2020-09-11 (×7): 500 mg via INTRAVENOUS
  Filled 2020-09-08 (×11): qty 100

## 2020-09-08 MED ORDER — GABAPENTIN 300 MG PO CAPS
600.0000 mg | ORAL_CAPSULE | Freq: Three times a day (TID) | ORAL | Status: DC
  Filled 2020-09-08: qty 2

## 2020-09-08 MED ORDER — ONDANSETRON HCL 4 MG/2ML IJ SOLN
4.0000 mg | Freq: Once | INTRAMUSCULAR | Status: AC
  Administered 2020-09-08: 4 mg via INTRAVENOUS
  Filled 2020-09-08: qty 2

## 2020-09-08 MED ORDER — MONTELUKAST SODIUM 10 MG PO TABS
10.0000 mg | ORAL_TABLET | Freq: Every day | ORAL | Status: DC
  Filled 2020-09-08: qty 1

## 2020-09-08 MED ORDER — SODIUM CHLORIDE 0.9 % IV SOLN
1.0000 g | Freq: Once | INTRAVENOUS | Status: AC
  Administered 2020-09-08: 1 g via INTRAVENOUS
  Filled 2020-09-08: qty 10

## 2020-09-08 MED ORDER — ACETAMINOPHEN 650 MG RE SUPP: 650.0000 mg | Freq: Four times a day (QID) | RECTAL | Status: DC | PRN

## 2020-09-08 MED ORDER — MAGNESIUM HYDROXIDE 400 MG/5ML PO SUSP: 30.0000 mL | Freq: Every day | ORAL | Status: DC | PRN

## 2020-09-08 MED ORDER — SODIUM CHLORIDE 0.9 % IV SOLN
200.0000 mg | Freq: Once | INTRAVENOUS | Status: DC
  Filled 2020-09-08: qty 40

## 2020-09-08 MED ORDER — MELOXICAM 7.5 MG PO TABS
7.5000 mg | ORAL_TABLET | Freq: Every day | ORAL | Status: DC
  Filled 2020-09-08 (×3): qty 1

## 2020-09-08 MED ORDER — FUROSEMIDE 20 MG PO TABS: 20.0000 mg | ORAL_TABLET | Freq: Every day | ORAL | Status: DC | PRN

## 2020-09-08 MED ORDER — ZINC SULFATE 220 (50 ZN) MG PO CAPS
220.0000 mg | ORAL_CAPSULE | Freq: Every day | ORAL | Status: DC
  Filled 2020-09-08 (×3): qty 1

## 2020-09-08 MED ORDER — HYDROMORPHONE HCL 1 MG/ML IJ SOLN
1.0000 mg | INTRAMUSCULAR | Status: DC | PRN
Start: 2020-09-08 — End: 2020-09-09
  Administered 2020-09-09: 1 mg via INTRAVENOUS
  Filled 2020-09-08: qty 1

## 2020-09-08 MED ORDER — PANTOPRAZOLE SODIUM 20 MG PO TBEC
20.0000 mg | DELAYED_RELEASE_TABLET | Freq: Every day | ORAL | Status: DC | PRN
  Filled 2020-09-08: qty 1

## 2020-09-08 MED ORDER — GUAIFENESIN-DM 100-10 MG/5ML PO SYRP: 10.0000 mL | ORAL_SOLUTION | ORAL | Status: DC | PRN

## 2020-09-08 MED ORDER — FLUTICASONE PROPIONATE 50 MCG/ACT NA SUSP: 1.0000 | Freq: Every day | NASAL | Status: DC

## 2020-09-08 MED ORDER — HYDROMORPHONE HCL 1 MG/ML IJ SOLN
1.0000 mg | Freq: Once | INTRAMUSCULAR | Status: AC
  Administered 2020-09-08: 1 mg via INTRAVENOUS
  Filled 2020-09-08: qty 1

## 2020-09-08 MED ORDER — VALSARTAN-HYDROCHLOROTHIAZIDE 160-12.5 MG PO TABS: 1.0000 | ORAL_TABLET | Freq: Every day | ORAL | Status: DC

## 2020-09-08 MED ORDER — ACETAMINOPHEN 325 MG PO TABS
650.0000 mg | ORAL_TABLET | Freq: Four times a day (QID) | ORAL | Status: DC | PRN
Start: 2020-09-08 — End: 2020-09-11

## 2020-09-08 MED ORDER — DIBUCAINE (PERIANAL) 1 % EX OINT
1.0000 "application " | TOPICAL_OINTMENT | CUTANEOUS | Status: DC | PRN
  Filled 2020-09-08: qty 28

## 2020-09-08 MED ORDER — SODIUM CHLORIDE 0.9 % IV SOLN
2.0000 g | INTRAVENOUS | Status: DC
  Administered 2020-09-09 – 2020-09-10 (×2): 2 g via INTRAVENOUS
  Filled 2020-09-08: qty 2
  Filled 2020-09-08 (×2): qty 20

## 2020-09-08 MED ORDER — ESTRADIOL 0.05 MG/24HR TD PTWK: 0.0500 mg | MEDICATED_PATCH | TRANSDERMAL | Status: DC

## 2020-09-08 MED ORDER — ONDANSETRON HCL 4 MG PO TABS: 4.0000 mg | ORAL_TABLET | Freq: Four times a day (QID) | ORAL | Status: DC | PRN

## 2020-09-08 MED ORDER — TRAZODONE HCL 50 MG PO TABS: 25.0000 mg | ORAL_TABLET | Freq: Every evening | ORAL | Status: DC | PRN

## 2020-09-08 MED ORDER — HYDROCORT-PRAMOXINE (PERIANAL) 1-1 % EX FOAM: 1.0000 | Freq: Two times a day (BID) | CUTANEOUS | Status: DC

## 2020-09-08 MED ORDER — HYOSCYAMINE SULFATE 0.125 MG PO TBDP
0.1250 mg | ORAL_TABLET | Freq: Four times a day (QID) | ORAL | Status: DC | PRN
  Filled 2020-09-08: qty 1

## 2020-09-08 MED ORDER — LACTATED RINGERS IV BOLUS
1000.0000 mL | Freq: Once | INTRAVENOUS | Status: AC
  Administered 2020-09-08: 1000 mL via INTRAVENOUS

## 2020-09-08 MED ORDER — IOHEXOL 300 MG/ML  SOLN
100.0000 mL | Freq: Once | INTRAMUSCULAR | Status: AC | PRN
  Administered 2020-09-08: 100 mL via INTRAVENOUS
  Filled 2020-09-08: qty 100

## 2020-09-08 MED ORDER — ONDANSETRON HCL 4 MG/2ML IJ SOLN: 4.0000 mg | Freq: Four times a day (QID) | INTRAMUSCULAR | Status: DC | PRN

## 2020-09-08 MED ORDER — OMEPRAZOLE MAGNESIUM 20 MG PO TBEC: 20.0000 mg | DELAYED_RELEASE_TABLET | Freq: Every day | ORAL | Status: DC | PRN

## 2020-09-08 NOTE — ED Notes (Signed)
ED Provider at bedside. 

## 2020-09-08 NOTE — ED Provider Notes (Addendum)
[facility] Emergency Department Provider Note  ____________________________________________   Event Date/Time   First MD Initiated Contact with Patient 09/08/20 1918     (approximate)  I have reviewed the triage vital signs and the nursing notes.   HISTORY  Chief Complaint Abdominal Pain   HPI Paula Moses is a 55 y.o. female with a past medical history of HTN, HDL, anxiety, arthritis, trigeminal neuralgia and several previous episodes of diverticulitis with previous perforations who presents for assessment of acute onset of left lower quadrant abdominal pain associate with nausea starting today.  Patient states this feels similar but worse than prior diverticular episode she has had a past.  States she does not have burning when she pees but that her abdominal pain is worse when she is urinating.  She does feel a little constipated and has been pooping very small amount of last couple days.  She did have an a little bit of bowel movement today.  No blood in her urine or poop.  She has not had any vomiting, chest pain, cough, shortness of breath, headache and earache, sore throat or any other clear associated sick symptoms.  No other concerns at this time.         Past Medical History:  Diagnosis Date  . Abnormal Pap smear of cervix   . Abnormal vaginal bleeding   . Anxiety   . Arthritis    neck and body  . Complication of anesthesia    IT TAKES ALOT OF ANESTHESIA TO KEEP PT SEDATED  . Cyst of vulva   . Dyspareunia   . External hemorrhoid   . Fibroids   . GERD (gastroesophageal reflux disease)   . Headache 2019   migraines, becoming more frequent  . High cholesterol   . History of UTI   . Hypertension   . Left-sided face pain   . Neck pain   . Paresthesia   . Rectocele   . Surgical menopause   . Trigeminal neuralgia   . Uterus prolapse     Patient Active Problem List   Diagnosis Date Noted  . Acute diverticulitis 09/08/2020  .  Concentration deficit 06/24/2020  . Abdominal distension 06/24/2020  . Atherosclerosis of iliac artery 06/24/2020  . Posterior knee pain, right 06/24/2020  . BRBPR (bright red blood per rectum) 09/04/2019  . Abdominal cramps 09/04/2019  . History of blood in urine 09/04/2019  . Irritable bowel syndrome 09/04/2019  . Internal bleeding hemorrhoids 06/20/2019  . Spinal stenosis of lumbar region without neurogenic claudication 02/28/2019  . Obesity 02/28/2019  . History of total right hip replacement 02/25/2019  . Screening for colon cancer   . Rectal polyp   . Polyp of colon   . Osteoarthritis of fingers of both hands 12/21/2018  . Constipation 12/12/2018  . 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 Unilateral primary osteoarthritis, left hip 01/22/2018  . Arthritis of left hip 12/20/2017  . Degenerative disc disease, lumbar 12/20/2017  . Status post total hip replacement, right 10/26/2017  . Femoroacetabular impingement 05/05/2017  . Lumbar spondylosis 02/13/2017  . Surgical menopause 01/26/2017  . Status post vaginal hysterectomy BSO 01/26/2017  . Hypertension   . High cholesterol   . Fibroids   . Trigeminal neuralgia   . Neck pain on left side 07/16/2013  . Paresthesia 07/16/2013    Past Surgical History:  Procedure Laterality Date  . AUGMENTATION MAMMAPLASTY Bilateral 2006  . BREAST ENHANCEMENT SURGERY    .  CHOLECYSTECTOMY    . COLONOSCOPY WITH PROPOFOL N/A 01/31/2019   Procedure: COLONOSCOPY WITH PROPOFOL;  Surgeon: [name], MD;  Location: [facility] ENDOSCOPY;  Service: Endoscopy;  Laterality: N/A;  2 day prep   . HIP ARTHROSCOPY Right 05/05/2017   Procedure: ARTHROSCOPY HIP;  Surgeon: Leim Fabry, MD;  Location: [facility] ORS;  Service: Orthopedics;  Laterality: Right;  . LAPAROSCOPY     X2  . TOTAL HIP ARTHROPLASTY Right 10/26/2017   Procedure: TOTAL HIP ARTHROPLASTY ANTERIOR APPROACH;  Surgeon: [name], MD;  Location: [facility] ORS;  Service:  Orthopedics;  Laterality: Right;  . TOTAL HIP ARTHROPLASTY Left 03/30/2018   Procedure: LEFT TOTAL HIP ARTHROPLASTY ANTERIOR APPROACH;  Surgeon: [name], MD;  Location: [facility] ORS;  Service: Orthopedics;  Laterality: Left;  . tummy tuck     . VAGINAL HYSTERECTOMY     lahv bso    Prior to Admission medications   Medication Sig Start Date End Date Taking? Authorizing Provider  Azelastine HCl 137 MCG/SPRAY SOLN Place 2 sprays into both nostrils 2 (two) times daily. 01/18/19   [provider]  Biotin w/ Vitamins C & E (HAIR/SKIN/NAILS PO) Take 2 tablets by mouth every evening.    [provider]  buPROPion (WELLBUTRIN XL) 150 MG 24 hr tablet Take 1 tablet (150 mg total) by mouth daily. 07/02/20   [name], MD  dibucaine (NUPERCAINAL) 1 % OINT Place 1 application rectally as needed for hemorrhoids. 06/19/19   [name], MD  estradiol (CLIMARA - DOSED IN MG/24 HR) 0.05 mg/24hr patch Place 0.05 mg onto the skin once a week. 01/01/20   [provider]  fluticasone (FLONASE) 50 MCG/ACT nasal spray SPRAY TWICE IN EACH NOSTRIL ONCE DAILY 02/17/19   [provider]  furosemide (LASIX) 20 MG tablet Take 1 tablet (20 mg total) by mouth daily as needed. For fluid retention 06/17/20   [name], MD  gabapentin (NEURONTIN) 300 MG capsule Take 2 capsules (600 mg total) by mouth 3 (three) times daily. 05/07/18   [name], MD  hydrocortisone-pramoxine [facility]) rectal foam Place 1 applicator rectally 2 (two) times daily. 06/19/19   [name], MD  meloxicam (MOBIC) 7.5 MG tablet Take 1 tablet by mouth once daily 08/27/20   [name], MD  montelukast (SINGULAIR) 10 MG tablet Take 10 mg by mouth at bedtime.    [provider]  omeprazole (PRILOSEC OTC) 20 MG tablet Take 20 mg by mouth daily as needed (acid reflux).    [provider]  OVER THE COUNTER MEDICATION Take 3 tablets by mouth 2 (two) times daily. Fiber  Well Gummies    [provider]  PROGESTERONE PO TAKE 1 CAPSULE BY MOUTH AT BEDTIME. IF NOT HELPING WITH SLEEP, TAKE 2 CAPSULES AT BEDTIME. 03/30/20   [provider]  rizatriptan (MAXALT) 5 MG tablet Take 5 mg by mouth every 2 (two) hours as needed for migraine. 09/21/17   [provider]  valsartan-hydrochlorothiazide (DIOVAN-HCT) 160-12.5 MG tablet Take 1 tablet by mouth once daily 06/17/20   [name], MD    Allergies Aspirin, Codeine, Percocet [oxycodone-acetaminophen], Phenergan [promethazine hcl], Tape, and Tramadol  Family History  Problem Relation Age of Onset  . Brain cancer Mother   . Dementia Father   . Breast cancer Neg Hx     Social History Social History   Tobacco Use  . Smoking status: Never  . Smokeless tobacco: Never  Vaping Use  .  Vaping Use: Never used  Substance Use Topics  . Alcohol use: Yes    Comment: none last 24hrs  . Drug use: No    Review of Systems  Review of Systems  Constitutional:  Negative for chills and fever.  HENT:  Negative for sore throat.   Eyes:  Negative for pain.  Respiratory:  Negative for cough and stridor.   Cardiovascular:  Negative for chest pain.  Gastrointestinal:  Positive for abdominal pain, constipation and nausea. Negative for vomiting.  Genitourinary:  Negative for dysuria.  Musculoskeletal:  Negative for myalgias.  Skin:  Negative for rash.  Neurological:  Negative for seizures, loss of consciousness and headaches.  Psychiatric/Behavioral:  Negative for suicidal ideas.   All other systems reviewed and are negative.    ____________________________________________   PHYSICAL EXAM:  VITAL SIGNS: ED Triage Vitals  Enc Vitals Group     BP 09/08/20 1713 (!) 150/97     Pulse Rate 09/08/20 1713 76     Resp 09/08/20 1713 17     Temp 09/08/20 1713 98.5 F (36.9 C)     Temp src --      SpO2 09/08/20 1713 100 %     Weight --      Height --      Head Circumference --      Peak Flow  --      Pain Score 09/08/20 1711 6     Pain Loc --      Pain Edu? --      Excl. in [place]? --    Vitals:   09/08/20 1713 09/08/20 1954  BP: (!) 150/97 (!) 145/80  Pulse: 76 70  Resp: 17 18  Temp: 98.5 F (36.9 C)   SpO2: 100% 99%   Physical Exam Vitals and nursing note reviewed.  Constitutional:      General: She is not in acute distress.    Appearance: She is well-developed.  HENT:     Head: Normocephalic and atraumatic.     Right Ear: External ear normal.     Left Ear: External ear normal.     Nose: Nose normal.  Eyes:     Conjunctiva/sclera: Conjunctivae normal.  Cardiovascular:     Rate and Rhythm: Normal rate and regular rhythm.     Heart sounds: No murmur heard. Pulmonary:     Effort: Pulmonary effort is normal. No respiratory distress.     Breath sounds: Normal breath sounds.  Abdominal:     Palpations: Abdomen is soft.     Tenderness: There is abdominal tenderness in the epigastric area, periumbilical area, suprapubic area and left lower quadrant.  Musculoskeletal:     Cervical back: Neck supple.  Skin:    General: Skin is warm and dry.     Capillary Refill: Capillary refill takes less than 2 seconds.  Neurological:     Mental Status: She is alert and oriented to person, place, and time.  Psychiatric:        Mood and Affect: Mood normal.     ____________________________________________   LABS (all labs ordered are listed, but only abnormal results are displayed)  Labs Reviewed  RESP PANEL BY RT-PCR (FLU A&B, COVID) ARPGX2 - Abnormal; Notable for the following components:      Result Value   SARS Coronavirus 2 by RT PCR POSITIVE (*)    All other components within normal limits  COMPREHENSIVE METABOLIC PANEL - Abnormal; Notable for the following components:   Sodium 134 (*)  All other components within normal limits  CBC - Abnormal; Notable for the following components:   WBC 14.1 (*)    All other components within normal limits  URINALYSIS, COMPLETE  (UACMP) WITH MICROSCOPIC - Abnormal; Notable for the following components:   Color, Urine YELLOW (*)    APPearance HAZY (*)    pH 9.0 (*)    All other components within normal limits  CULTURE, BLOOD (SINGLE)  LIPASE, BLOOD  HIV ANTIBODY (ROUTINE TESTING W REFLEX)  CBC  C-REACTIVE PROTEIN  BRAIN NATRIURETIC PEPTIDE  D-DIMER, QUANTITATIVE  FERRITIN  FIBRINOGEN  LACTATE DEHYDROGENASE  PROCALCITONIN  CBC WITH DIFFERENTIAL/PLATELET  COMPREHENSIVE METABOLIC PANEL  C-REACTIVE PROTEIN  D-DIMER, QUANTITATIVE  FERRITIN  TROPONIN I (HIGH SENSITIVITY)   ____________________________________________  EKG  ____________________________________________  RADIOLOGY  ED MD interpretation: CT abdomen pelvis remarkable for circumferential thickened sigmoid colon with diverticulitis and significant phlegmon without clear abscess or perforation.  No other clear acute abdominopelvic process.  Official radiology report(s): CT ABDOMEN PELVIS W CONTRAST  Result Date: 09/08/2020 CLINICAL DATA:  Nonlocalized abdominal pain which began 2 days prior, history of diverticulitis EXAM: CT ABDOMEN AND PELVIS WITH CONTRAST TECHNIQUE: Multidetector CT imaging of the abdomen and pelvis was performed using the standard protocol following bolus administration of intravenous contrast. CONTRAST:  170mL OMNIPAQUE IOHEXOL 300 MG/ML  SOLN COMPARISON:  CT 03/18/2019 FINDINGS: Lower chest: Bilateral breast prostheses without acute CT evident complication. Lung bases are clear. Normal heart size. No pericardial effusion. Hepatobiliary: No worrisome focal liver lesions. Smooth liver surface contour. Normal hepatic attenuation. Prior cholecystectomy. No significant biliary ductal dilatation accounting for post cholecystectomy reservoir effect. No visible intraductal gallstones. Pancreas: No pancreatic ductal dilatation or surrounding inflammatory changes. Spleen: Normal in size. No concerning splenic lesions. Adrenals/Urinary  Tract: Normal adrenals. Kidneys enhance and excrete symmetrically. Stable region of cortical scarring in the upper pole left kidney. No worrisome renal mass. No urolithiasis or hydronephrosis. Portion of the pelvis obscured by streak artifact. Slightly asymmetric bladder wall thickening adjacent a region of left pelvic inflammation is favored to be reactive. No visible bladder calculi or debris. Stomach/Bowel: Distal esophagus, stomach and duodenum are unremarkable. No small bowel thickening or dilatation. Noninflamed appendix in the right lower quadrant. Proximal colon is unremarkable. There is however more segmental thickening of the proximal to mid sigmoid with extensive surrounding phlegmon and inflammatory changes much of which appears centered on upon a culprit diverticulum abutting the left pelvic sidewall (5/41). Adjacent reactive free fluid is noted. No clear extraluminal gas is present. No organized abscess or collection. Inflammatory changes extend across the left pelvic sidewall with peritoneal thickening and enhancement. Vascular/Lymphatic: Increased vascularity with adjacent reactive appearing lymph nodes in the left lower quadrant in the vicinity of the inflamed sigmoid colon. No other suspicious or pathologically enlarged lymph nodes are seen in the abdomen or pelvis. Atherosclerotic plaque in the abdominal aorta and branch vessels without aneurysm, ectasia or other acute vascular abnormality. Reproductive: Post hysterectomy. Inflammatory changes in the left adnexa related to the colon as above. Other: Extensive phlegmonous changes and peritoneal thickening adjacent the thickened, inflamed sigmoid colon. Some distribution of the low-attenuation free fluid throughout the abdomen and pelvis. No evidence of organized abscess or collection. No free air. No bowel containing hernia. Portion of the pelvis obscured by streak artifact from bilateral hip prostheses. Musculoskeletal: Bilateral total hip  arthroplasties without acute hardware failure, periprosthetic fracture or other complication. Resulting streak artifact. 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  Minimal retrolisthesis L4 on L5 without additional spondylolysis. Multilevel degenerative changes are present in the imaged portions of the spine. IMPRESSION: 1. Circumferentially thickened sigmoid colon centered in a region of numerous colonic diverticula with a larger inflamed culprit diverticulum along the left pelvic sidewall compatible with an acute diverticulitis. Extensive surrounding phlegmon and features suggestive of adjacent peritonitis albeit without extraluminal gas, organized collection or abscess at this time though please note portions of the pelvis are poorly visualized due to streak artifact from bilateral hip prostheses. This is in a similar location to prior diverticulitis seen on comparison imaging 03/18/2019. 2. Bilateral hip arthroplasty without acute complication. 3. Chronic left renal scarring. 4. Prior cholecystectomy. 5.  Aortic Atherosclerosis (ICD10-I70.0). Electronically Signed   By: Lovena Le M.D.   On: 09/08/2020 20:17    ____________________________________________   PROCEDURES  Procedure(s) performed (including Critical Care):  .1-3 Lead EKG Interpretation  Date/Time: 09/08/2020 11:53 PM Performed by: Lucrezia Starch, MD Authorized by: Lucrezia Starch, MD     Interpretation: normal     ECG rate assessment: normal     Rhythm: sinus rhythm     Ectopy: none     Conduction: normal     ____________________________________________   INITIAL IMPRESSION / ASSESSMENT AND PLAN / ED COURSE      Patient presents for assessment approximately 1 day of abdominal pain in the left lower quadrant associate with some nausea.  She does note she is been little constipated.  She is afebrile and hemodynamically stable on arrival.  Differential includes possible recurrent diverticulitis,  kidney stone, SBO, and cystitis.  CT abdomen pelvis remarkable for circumferential thickened sigmoid colon with diverticulitis and significant phlegmon without clear abscess or perforation.  No other clear acute abdominopelvic process.  CMP without any significant electrode or metabolic derangements there is no findings on exam or CT to suggest pancreatitis.  In addition lipase is within normal limits.  CBC shows WBC count of 14.1 and normal hemoglobin.  However do not believe patient is septic at this time.  UA is unremarkable for evidence of cystitis.  Patient is noted to be COVID-positive which suspect this likely incidental she does not have any respiratory or thoracic symptoms of time.  Given degree of inflammation and developing phlegmon with a low white blood cell count and some tenderness after Dilaudid on reassessment will admit for observation.      ____________________________________________   FINAL CLINICAL IMPRESSION(S) / ED DIAGNOSES  Final diagnoses:  Diverticulitis  COVID    Medications  meloxicam (MOBIC) tablet 7.5 mg (has no administration in time range)  SUMAtriptan (IMITREX) tablet 50 mg (has no administration in time range)  furosemide (LASIX) tablet 20 mg (has no administration in time range)  buPROPion (WELLBUTRIN XL) 24 hr tablet 150 mg (has no administration in time range)  estradiol (CLIMARA - Dosed in mg/24 hr) patch 0.05 mg (0 mg Transdermal Hold 09/08/20 2237)  hyoscyamine (ANASPAZ) disintergrating tablet 0.125 mg (has no administration in time range)  gabapentin (NEURONTIN) capsule 600 mg (0 mg Oral Hold 09/08/20 2238)  fluticasone (FLONASE) 50 MCG/ACT nasal spray 1 spray (has no administration in time range)  montelukast (SINGULAIR) tablet 10 mg (0 mg Oral Hold 09/08/20 2238)  dibucaine (NUPERCAINAL) 1 % rectal ointment 1 application (has no administration in time range)  hydrocortisone-pramoxine (PROCTOFOAM-HC) rectal foam 1 applicator (0 applicators  Rectal Hold 09/08/20 2237)  0.9 %  sodium chloride infusion ( Intravenous New Bag/Given 09/08/20 2302)  acetaminophen (TYLENOL) tablet 650 mg (  has no administration in time range)    Or  acetaminophen (TYLENOL) suppository 650 mg (has no administration in time range)  traZODone (DESYREL) tablet 25 mg (has no administration in time range)  magnesium hydroxide (MILK OF MAGNESIA) suspension 30 mL (has no administration in time range)  ondansetron (ZOFRAN) tablet 4 mg (has no administration in time range)    Or  ondansetron (ZOFRAN) injection 4 mg (has no administration in time range)  cefTRIAXone (ROCEPHIN) 2 g in sodium chloride 0.9 % 100 mL IVPB (has no administration in time range)  metroNIDAZOLE (FLAGYL) IVPB 500 mg (has no administration in time range)  pantoprazole (PROTONIX) EC tablet 20 mg (has no administration in time range)  irbesartan (AVAPRO) tablet 150 mg (has no administration in time range)    And  hydrochlorothiazide (MICROZIDE) capsule 12.5 mg (has no administration in time range)  HYDROmorphone (DILAUDID) injection 1 mg (has no administration in time range)  remdesivir 200 mg in sodium chloride 0.9% 250 mL IVPB (has no administration in time range)    Followed by  remdesivir 100 mg in sodium chloride 0.9 % 100 mL IVPB (has no administration in time range)  guaiFENesin-dextromethorphan (ROBITUSSIN DM) 100-10 MG/5ML syrup 10 mL (has no administration in time range)  ascorbic acid (VITAMIN C) tablet 500 mg (has no administration in time range)  zinc sulfate capsule 220 mg (has no administration in time range)  cholecalciferol (VITAMIN D3) tablet 1,000 Units (has no administration in time range)  HYDROmorphone (DILAUDID) injection 1 mg (1 mg Intravenous Given 09/08/20 1954)  ondansetron (ZOFRAN) injection 4 mg (4 mg Intravenous Given 09/08/20 1953)  lactated ringers bolus 1,000 mL (0 mLs Intravenous Stopped 09/08/20 2220)  iohexol (OMNIPAQUE) 300 MG/ML solution 100 mL (100 mLs  Intravenous Contrast Given 09/08/20 2001)  cefTRIAXone (ROCEPHIN) 1 g in sodium chloride 0.9 % 100 mL IVPB (0 g Intravenous Stopped 09/08/20 2253)  metroNIDAZOLE (FLAGYL) IVPB 500 mg (0 mg Intravenous Stopped 09/08/20 2220)     ED Discharge Orders     None        Note:  This document was prepared using Dragon voice recognition software and may include unintentional dictation errors.    Lucrezia Starch, MD 09/08/20 2353    Lucrezia Starch, MD 09/08/20 [phone]

## 2020-09-08 NOTE — Telephone Encounter (Signed)
Spoke with pt and she stated that yesterday she developed intense lower abdominal pain. Pt stated that pain is constant but severity of pain changes. Pt stated that yesterday she had no trouble having bowel movements and had 4 of them, however today she has only had one bowel movement and it was small and hard. Pt stated that she has no fever. Last flare up of diverticulitis was a year or more ago. No appts available and pt stated that she doesn't have the money to go to urgent care.

## 2020-09-08 NOTE — Telephone Encounter (Signed)
Patient called and Paula Moses is having a diverticulitis flare up. Paula Moses is requesting Dr Derrel Nip to prescribe her medication. Paula Moses does not have the money to go to urgent care.

## 2020-09-08 NOTE — ED Notes (Signed)
Home medications yet to be verified. Patient unsure of daily medicines. Admit orders held until pharmacy verification.

## 2020-09-08 NOTE — ED Notes (Signed)
Blood culture collected prior to initiation of abx.

## 2020-09-08 NOTE — ED Notes (Signed)
Patient provided pillow. Patient notified of current NPO status. Patient denies further needs.

## 2020-09-08 NOTE — Telephone Encounter (Signed)
PT wanted to speak to Dr.Tullo about this matter and would like a callback. She states it has gotten worse.

## 2020-09-08 NOTE — Telephone Encounter (Signed)
Yes pt is willing to have the CT scan done

## 2020-09-08 NOTE — Progress Notes (Signed)
Remdesivir - Pharmacy Brief Note   O:  CXR: Not available at this time. SpO2: 99-100% on RA   A/P:  Remdesivir 200 mg IVPB once followed by 100 mg IVPB daily x 4 days.   Renda Rolls, PharmD, [facility] 09/08/2020 11:24 PM

## 2020-09-08 NOTE — Telephone Encounter (Signed)
Pt called to follow up on CT. She said that she needs to know if this is going to have the CT today or if she needs to go elsewhere

## 2020-09-08 NOTE — Telephone Encounter (Signed)
Called pt back to let her know that since we aren't able to get the CT scan done today she will need to go to the ED because she had a perforated colon the last time she had diverticulitis. Pt was upset because she started calling at 9:30 am and did not get a call back until 1:30. Explained to pt that Dr. Derrel Nip was not in the office until 1 pm and someone come in from the back ground and stated "no one told her that".

## 2020-09-08 NOTE — Telephone Encounter (Signed)
Spoke with pt and advised her that at this point if she is in that much pain she should be evaluated either at urgent care or ED. Pt stated that she did not want to go to either and will just wait till she gets a call for an appt for a CT scan.

## 2020-09-08 NOTE — ED Notes (Signed)
Patient is resting comfortably. 

## 2020-09-08 NOTE — H&P (Addendum)
[place]   PATIENT NAME: Paula Moses    MR#:  [id number]  DATE OF BIRTH:  [birth year]  DATE OF ADMISSION:  09/08/2020  PRIMARY CARE PHYSICIAN: [name], MD   Patient is coming from: Home  REQUESTING/REFERRING PHYSICIAN: Charlann Boxer, MD  CHIEF COMPLAINT:   Chief Complaint  Patient presents with  . Abdominal Pain    HISTORY OF PRESENT ILLNESS:  Paula Moses is a 55 y.o. Caucasian female with medical history significant for GERD, anxiety, dyslipidemia and hypertension, as well as diverticulitis twice with previous history of perforation, who presented to the emergency room with acute onset of left lower quadrant abdominal pain graded 6-7/10 and associated with nausea without vomiting or diarrhea.  She had a small bowel movement today.  She denied any melena or bright red bleeding per rectum.  Her pain has been radiating to the right lower quadrant as well as the left upper quadrant.  No chest pain or dyspnea or cough.  No dysuria, oliguria or hematuria or flank pain.  ED Course: When patient came to the ER blood pressure was 150/97 with otherwise normal vital signs.  Labs revealed mild hyponatremia with otherwise unremarkable CMP.  Lipase was 36.  CBC showed leukocytosis of 14.1.  Urinalysis was unremarkable.  Respiratory panel came back so far with positive COVID-19 PCR and negative influenza antigens..  Imaging: Abdominal and pelvic CT scan revealed the following: 1. Circumferentially thickened sigmoid colon centered in a region of numerous colonic diverticula with a larger inflamed culprit diverticulum along the left pelvic sidewall compatible with an acute diverticulitis. Extensive surrounding phlegmon and features suggestive of adjacent peritonitis albeit without extraluminal gas, organized collection or abscess at this time though please note portions of the pelvis are poorly visualized due to streak artifact from bilateral hip prostheses. This is in a similar  location to prior diverticulitis seen on comparison imaging 03/18/2019. 2. Bilateral hip arthroplasty without acute complication. 3. Chronic left renal scarring. 4. Prior cholecystectomy. 5.  Aortic Atherosclerosis.  The patient was given IV Flagyl and Rocephin, 1 L bolus of IV lactated Ringer, 4 mg IV Zofran and 1 mg of IV Dilaudid.  She will be admitted to a medical bed for further evaluation and management.  PAST MEDICAL HISTORY:   Past Medical History:  Diagnosis Date  . Abnormal Pap smear of cervix   . Abnormal vaginal bleeding   . Anxiety   . Arthritis    neck and body  . Complication of anesthesia    IT TAKES ALOT OF ANESTHESIA TO KEEP PT SEDATED  . Cyst of vulva   . Dyspareunia   . External hemorrhoid   . Fibroids   . GERD (gastroesophageal reflux disease)   . Headache 2019   migraines, becoming more frequent  . High cholesterol   . History of UTI   . Hypertension   . Left-sided face pain   . Neck pain   . Paresthesia   . Rectocele   . Surgical menopause   . Trigeminal neuralgia   . Uterus prolapse     PAST SURGICAL HISTORY:   Past Surgical History:  Procedure Laterality Date  . AUGMENTATION MAMMAPLASTY Bilateral 2006  . BREAST ENHANCEMENT SURGERY    . CHOLECYSTECTOMY    . COLONOSCOPY WITH PROPOFOL N/A 01/31/2019   Procedure: COLONOSCOPY WITH PROPOFOL;  Surgeon: [name], MD;  Location: [facility];  Service: Endoscopy;  Laterality: N/A;  2 day prep   . HIP  ARTHROSCOPY Right 05/05/2017   Procedure: ARTHROSCOPY HIP;  Surgeon: Leim Fabry, MD;  Location: [facility] ORS;  Service: Orthopedics;  Laterality: Right;  . LAPAROSCOPY     X2  . TOTAL HIP ARTHROPLASTY Right 10/26/2017   Procedure: TOTAL HIP ARTHROPLASTY ANTERIOR APPROACH;  Surgeon: [name], MD;  Location: [facility] ORS;  Service: Orthopedics;  Laterality: Right;  . TOTAL HIP ARTHROPLASTY Left 03/30/2018   Procedure: LEFT TOTAL HIP ARTHROPLASTY ANTERIOR APPROACH;  Surgeon: [name], MD;  Location: [facility];  Service: Orthopedics;  Laterality: Left;  . tummy tuck     . VAGINAL HYSTERECTOMY     lahv bso    SOCIAL HISTORY:   Social History   Tobacco Use  . Smoking status: Never  . Smokeless tobacco: Never  Substance Use Topics  . Alcohol use: Yes    Comment: none last 24hrs    FAMILY HISTORY:   Family History  Problem Relation Age of Onset  . Brain cancer Mother   . Dementia Father   . Breast cancer Neg Hx     DRUG ALLERGIES:   Allergies  Allergen Reactions  . Aspirin Shortness Of Breath and Other (See Comments)    congestion  . Codeine Itching and Other (See Comments)    Facial itching   . Percocet [Oxycodone-Acetaminophen] Itching and Other (See Comments)    Facial itching   . Phenergan [Promethazine Hcl] Itching and Other (See Comments)    Facial itching   . Tape Other (See Comments)    Surgical tape caused blisters  . Tramadol     Nausea     REVIEW OF SYSTEMS:   ROS As per history of present illness. All pertinent systems were reviewed above. Constitutional, HEENT, cardiovascular, respiratory, GI, GU, musculoskeletal, neuro, psychiatric, endocrine, integumentary and hematologic systems were reviewed and are otherwise negative/unremarkable except for positive findings mentioned above in the HPI.   MEDICATIONS AT HOME:   Prior to Admission medications   Medication Sig Start Date End Date Taking? Authorizing Provider  Azelastine HCl 137 MCG/SPRAY SOLN Place 2 sprays into both nostrils 2 (two) times daily. 01/18/19   [provider]  Biotin w/ Vitamins C & E (HAIR/SKIN/NAILS PO) Take 2 tablets by mouth every evening.    [provider]  buPROPion (WELLBUTRIN XL) 150 MG 24 hr tablet Take 1 tablet (150 mg total) by mouth daily. 07/02/20   [name], MD  dibucaine (NUPERCAINAL) 1 % OINT Place 1 application rectally as needed for hemorrhoids. 06/19/19   [name], MD  estradiol (CLIMARA - DOSED IN MG/24  HR) 0.05 mg/24hr patch Place 0.05 mg onto the skin once a week. 01/01/20   [provider]  fluticasone (FLONASE) 50 MCG/ACT nasal spray SPRAY TWICE IN EACH NOSTRIL ONCE DAILY 02/17/19   [provider]  furosemide (LASIX) 20 MG tablet Take 1 tablet (20 mg total) by mouth daily as needed. For fluid retention 06/17/20   [name], MD  gabapentin (NEURONTIN) 300 MG capsule Take 2 capsules (600 mg total) by mouth 3 (three) times daily. 05/07/18   [name], MD  hydrocortisone-pramoxine [facility]) rectal foam Place 1 applicator rectally 2 (two) times daily. 06/19/19   [name], MD  meloxicam (MOBIC) 7.5 MG tablet Take 1 tablet by mouth once daily 08/27/20   [name], MD  montelukast (SINGULAIR) 10 MG tablet Take 10 mg by mouth at bedtime.    [provider]  omeprazole (PRILOSEC OTC) 20  MG tablet Take 20 mg by mouth daily as needed (acid reflux).    [provider]  OVER THE COUNTER MEDICATION Take 3 tablets by mouth 2 (two) times daily. Fiber Well Gummies    [provider]  PROGESTERONE PO TAKE 1 CAPSULE BY MOUTH AT BEDTIME. IF NOT HELPING WITH SLEEP, TAKE 2 CAPSULES AT BEDTIME. 03/30/20   [provider]  rizatriptan (MAXALT) 5 MG tablet Take 5 mg by mouth every 2 (two) hours as needed for migraine. 09/21/17   [provider]  valsartan-hydrochlorothiazide (DIOVAN-HCT) 160-12.5 MG tablet Take 1 tablet by mouth once daily 06/17/20   [name], MD      VITAL SIGNS:  Blood pressure (!) 145/80, pulse 70, temperature 98.5 F (36.9 C), resp. rate 18, SpO2 99 %.  PHYSICAL EXAMINATION:  Physical Exam  GENERAL:  55 y.o.-year-old Caucasian female patient lying in the bed with no acute distress.  EYES: Pupils equal, round, reactive to light and accommodation. No scleral icterus. Extraocular muscles intact.  HEENT: Head atraumatic, normocephalic. Oropharynx and nasopharynx clear.  NECK:  Supple, no jugular  venous distention. No thyroid enlargement, no tenderness.  LUNGS: Normal breath sounds bilaterally, no wheezing, rales,rhonchi or crepitation. No use of accessory muscles of respiration.  CARDIOVASCULAR: Regular rate and rhythm, S1, S2 normal. No murmurs, rubs, or gallops.  ABDOMEN: Soft, nondistended, with left lower quadrant tenderness without rebound tenderness guarding or rigidity.  Bowel sounds present. No organomegaly or mass.  EXTREMITIES: No pedal edema, cyanosis, or clubbing.  NEUROLOGIC: Cranial nerves II through XII are intact. Muscle strength 5/5 in all extremities. Sensation intact. Gait not checked.  PSYCHIATRIC: The patient is alert and oriented x 3.  Normal affect and good eye contact. SKIN: No obvious rash, lesion, or ulcer.   LABORATORY PANEL:   CBC Recent Labs  Lab 09/08/20 1713  WBC 14.1*  HGB 14.0  HCT 39.5  PLT 259   ------------------------------------------------------------------------------------------------------------------  Chemistries  Recent Labs  Lab 09/08/20 1713  NA 134*  K 4.0  CL 101  CO2 27  GLUCOSE 83  BUN 11  CREATININE 0.90  CALCIUM 8.9  AST 20  ALT 20  ALKPHOS 57  BILITOT 0.7   ------------------------------------------------------------------------------------------------------------------  Cardiac Enzymes No results for input(s): TROPONINI in the last 168 hours. ------------------------------------------------------------------------------------------------------------------  RADIOLOGY:  CT ABDOMEN PELVIS W CONTRAST  Result Date: 09/08/2020 CLINICAL DATA:  Nonlocalized abdominal pain which began 2 days prior, history of diverticulitis EXAM: CT ABDOMEN AND PELVIS WITH CONTRAST TECHNIQUE: Multidetector CT imaging of the abdomen and pelvis was performed using the standard protocol following bolus administration of intravenous contrast. CONTRAST:  123mL OMNIPAQUE IOHEXOL 300 MG/ML  SOLN COMPARISON:  CT 03/18/2019 FINDINGS: Lower  chest: Bilateral breast prostheses without acute CT evident complication. Lung bases are clear. Normal heart size. No pericardial effusion. Hepatobiliary: No worrisome focal liver lesions. Smooth liver surface contour. Normal hepatic attenuation. Prior cholecystectomy. No significant biliary ductal dilatation accounting for post cholecystectomy reservoir effect. No visible intraductal gallstones. Pancreas: No pancreatic ductal dilatation or surrounding inflammatory changes. Spleen: Normal in size. No concerning splenic lesions. Adrenals/Urinary Tract: Normal adrenals. Kidneys enhance and excrete symmetrically. Stable region of cortical scarring in the upper pole left kidney. No worrisome renal mass. No urolithiasis or hydronephrosis. Portion of the pelvis obscured by streak artifact. Slightly asymmetric bladder wall thickening adjacent a region of left pelvic inflammation is favored to be reactive. No visible bladder calculi or debris. Stomach/Bowel: Distal esophagus, stomach and duodenum are unremarkable. No small  bowel thickening or dilatation. Noninflamed appendix in the right lower quadrant. Proximal colon is unremarkable. There is however more segmental thickening of the proximal to mid sigmoid with extensive surrounding phlegmon and inflammatory changes much of which appears centered on upon a culprit diverticulum abutting the left pelvic sidewall (5/41). Adjacent reactive free fluid is noted. No clear extraluminal gas is present. No organized abscess or collection. Inflammatory changes extend across the left pelvic sidewall with peritoneal thickening and enhancement. Vascular/Lymphatic: Increased vascularity with adjacent reactive appearing lymph nodes in the left lower quadrant in the vicinity of the inflamed sigmoid colon. No other suspicious or pathologically enlarged lymph nodes are seen in the abdomen or pelvis. Atherosclerotic plaque in the abdominal aorta and branch vessels without aneurysm, ectasia or  other acute vascular abnormality. Reproductive: Post hysterectomy. Inflammatory changes in the left adnexa related to the colon as above. Other: Extensive phlegmonous changes and peritoneal thickening adjacent the thickened, inflamed sigmoid colon. Some distribution of the low-attenuation free fluid throughout the abdomen and pelvis. No evidence of organized abscess or collection. No free air. No bowel containing hernia. Portion of the pelvis obscured by streak artifact from bilateral hip prostheses. Musculoskeletal: Bilateral total hip arthroplasties without acute hardware failure, periprosthetic fracture or other complication. Resulting streak artifact. 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 Minimal retrolisthesis L4 on L5 without additional spondylolysis. Multilevel degenerative changes are present in the imaged portions of the spine. IMPRESSION: 1. Circumferentially thickened sigmoid colon centered in a region of numerous colonic diverticula with a larger inflamed culprit diverticulum along the left pelvic sidewall compatible with an acute diverticulitis. Extensive surrounding phlegmon and features suggestive of adjacent peritonitis albeit without extraluminal gas, organized collection or abscess at this time though please note portions of the pelvis are poorly visualized due to streak artifact from bilateral hip prostheses. This is in a similar location to prior diverticulitis seen on comparison imaging 03/18/2019. 2. Bilateral hip arthroplasty without acute complication. 3. Chronic left renal scarring. 4. Prior cholecystectomy. 5.  Aortic Atherosclerosis (ICD10-I70.0). Electronically Signed   By: Lovena Le M.D.   On: 09/08/2020 20:17      IMPRESSION AND PLAN:  Active Problems:   Acute diverticulitis  1.  Acute sigmoid diverticulitis with associated phlegmon and associated leukocytosis without sepsis. - The patient be admitted to a medical bed. - Pain management will be  provided. - We will continue antibiotic therapy with IV Rocephin and Flagyl. - We will follow her CBC.  2.  Essential hypertension with currently uncontrolled hypertension likely due to pain. - We will continue Diovan HCT and placed on as needed IV labetalol.  3.  Incidental COVID-19 infection. - The patient will be placed on IV remdesivir. -Will place on vitamin C, vitamin D3 and zinc sulfate. - We will check inflammatory markers.  4.  GERD. - We will continue PPI therapy.  5.  Seasonal allergies. - We will continue her Flonase and Singulair.  6.  Depression. - We will continue Wellbutrin XL.  DVT prophylaxis: SCDs.  Medical prophylaxis is currently contraindicated due to high risk of bleeding with diverticulitis. Code Status: full code. Family Communication:  The plan of care was discussed in details with the patient (and family). I answered all questions. The patient agreed to proceed with the above mentioned plan. Further management will depend upon hospital course. Disposition Plan: Back to previous home environment Consults called: none. All the records are reviewed and case discussed with ED provider.  Status is:  Inpatient  Remains inpatient appropriate because:Ongoing active pain requiring inpatient pain management, Ongoing diagnostic testing needed not appropriate for outpatient work up, Unsafe d/c plan, IV treatments appropriate due to intensity of illness or inability to take PO, and Inpatient level of care appropriate due to severity of illness  Dispo: The patient is from: Home              Anticipated d/c is to: Home              Patient currently is not medically stable to d/c.   Difficult to place patient No   TOTAL TIME TAKING CARE OF THIS PATIENT: 55 minutes.    Christel Mormon M.D on 09/08/2020 at 10:16 PM  [facility]   From 7 PM-7 AM, contact night-coverage [email or web address]  CC: Primary care physician; [name], MD

## 2020-09-08 NOTE — ED Notes (Signed)
Patient notified of COVID+ status.

## 2020-09-08 NOTE — ED Notes (Signed)
Patient is resting comfortably. Patient updated on pending admission and eval from inpatient hospitalist. Patient denies needs at this time.

## 2020-09-08 NOTE — ED Notes (Signed)
Patient transported to CT 

## 2020-09-08 NOTE — ED Triage Notes (Addendum)
Pt come with c/o abdominal pain. Pt states this started 2 days ago. Pt states hx of diverticulitis.  Pt denies any /V/D. Pt states some nausea.  Pt states some pain when urinating but no burning.

## 2020-09-08 NOTE — ED Notes (Signed)
Patient is resting comfortably. Dr. Sidney Ace at bedside. Patient provided foods for clear liquid diet. Patient also provided socks, and patient belonging bag for clothing.

## 2020-09-08 NOTE — ED Notes (Signed)
Patient reports severe LLQ pain. Patient reports pain x today. Patient reports nausea, but denies vomiting. Patient reports hx of diverticulitis with perforation. Patient denies diarrhea.

## 2020-09-09 ENCOUNTER — Encounter: Payer: Self-pay | Admitting: Family Medicine

## 2020-09-09 ENCOUNTER — Inpatient Hospital Stay: Payer: 59

## 2020-09-09 LAB — CBC WITH DIFFERENTIAL/PLATELET
Abs Immature Granulocytes: 0.04 10*3/uL (ref 0.00–0.07)
Basophils Absolute: 0 10*3/uL (ref 0.0–0.1)
Basophils Relative: 0 %
Eosinophils Absolute: 0.1 10*3/uL (ref 0.0–0.5)
Eosinophils Relative: 1 %
HCT: 34.8 % — ABNORMAL LOW (ref 36.0–46.0)
Hemoglobin: 12 g/dL (ref 12.0–15.0)
Immature Granulocytes: 0 %
Lymphocytes Relative: 21 %
Lymphs Abs: 2.4 10*3/uL (ref 0.7–4.0)
MCH: 30.1 pg (ref 26.0–34.0)
MCHC: 34.5 g/dL (ref 30.0–36.0)
MCV: 87.2 fL (ref 80.0–100.0)
Monocytes Absolute: 0.8 10*3/uL (ref 0.1–1.0)
Monocytes Relative: 7 %
Neutro Abs: 7.9 10*3/uL — ABNORMAL HIGH (ref 1.7–7.7)
Neutrophils Relative %: 71 %
Platelets: 207 10*3/uL (ref 150–400)
RBC: 3.99 MIL/uL (ref 3.87–5.11)
RDW: 13.3 % (ref 11.5–15.5)
WBC: 11.3 10*3/uL — ABNORMAL HIGH (ref 4.0–10.5)
nRBC: 0 % (ref 0.0–0.2)

## 2020-09-09 LAB — C-REACTIVE PROTEIN
CRP: 3.6 mg/dL — ABNORMAL HIGH (ref <1.0)
CRP: 7.4 mg/dL — ABNORMAL HIGH (ref <1.0)

## 2020-09-09 LAB — COMPREHENSIVE METABOLIC PANEL
ALT: 21 U/L (ref 0–44)
AST: 24 U/L (ref 15–41)
Albumin: 3.6 g/dL (ref 3.5–5.0)
Alkaline Phosphatase: 50 U/L (ref 38–126)
Anion gap: 9 (ref 5–15)
BUN: 8 mg/dL (ref 6–20)
CO2: 26 mmol/L (ref 22–32)
Calcium: 8.5 mg/dL — ABNORMAL LOW (ref 8.9–10.3)
Chloride: 102 mmol/L (ref 98–111)
Creatinine, Ser: 0.94 mg/dL (ref 0.44–1.00)
GFR, Estimated: >60 mL/min (ref >60)
Glucose, Bld: 99 mg/dL (ref 70–99)
Potassium: 4.3 mmol/L (ref 3.5–5.1)
Sodium: 137 mmol/L (ref 135–145)
Total Bilirubin: 0.9 mg/dL (ref 0.3–1.2)
Total Protein: 6.7 g/dL (ref 6.5–8.1)

## 2020-09-09 LAB — TROPONIN I (HIGH SENSITIVITY)
Troponin I (High Sensitivity): 3 ng/L (ref <18)
Troponin I (High Sensitivity): 3 ng/L (ref <18)

## 2020-09-09 LAB — FERRITIN
Ferritin: 23 ng/mL (ref 11–307)
Ferritin: 34 ng/mL (ref 11–307)

## 2020-09-09 LAB — FIBRINOGEN: Fibrinogen: 338 mg/dL (ref 210–475)

## 2020-09-09 LAB — D-DIMER, QUANTITATIVE
D-Dimer, Quant: 0.35 ug/mL-FEU (ref 0.00–0.50)
D-Dimer, Quant: 0.38 ug/mL-FEU (ref 0.00–0.50)

## 2020-09-09 LAB — LACTATE DEHYDROGENASE: LDH: 101 U/L (ref 98–192)

## 2020-09-09 LAB — BRAIN NATRIURETIC PEPTIDE: B Natriuretic Peptide: 49 pg/mL (ref 0.0–100.0)

## 2020-09-09 LAB — HIV ANTIBODY (ROUTINE TESTING W REFLEX): HIV Screen 4th Generation wRfx: NONREACTIVE

## 2020-09-09 LAB — PROCALCITONIN: Procalcitonin: <0.1 ng/mL

## 2020-09-09 MED ORDER — FLUTICASONE PROPIONATE 50 MCG/ACT NA SUSP
1.0000 | Freq: Every day | NASAL | Status: DC | PRN
  Filled 2020-09-09: qty 16

## 2020-09-09 MED ORDER — DIPHENHYDRAMINE HCL 25 MG PO CAPS: 25.0000 mg | ORAL_CAPSULE | ORAL | Status: DC | PRN

## 2020-09-09 MED ORDER — FENTANYL CITRATE (PF) 100 MCG/2ML IJ SOLN
25.0000 ug | INTRAMUSCULAR | Status: DC | PRN
  Administered 2020-09-09 – 2020-09-11 (×5): 25 ug via INTRAVENOUS
  Filled 2020-09-09 (×5): qty 2

## 2020-09-09 MED ORDER — DIPHENHYDRAMINE HCL 50 MG/ML IJ SOLN
25.0000 mg | INTRAMUSCULAR | Status: DC | PRN
  Administered 2020-09-09: 25 mg via INTRAVENOUS
  Filled 2020-09-09: qty 1

## 2020-09-09 NOTE — Plan of Care (Signed)
Reviewed with patient

## 2020-09-09 NOTE — ED Notes (Signed)
Patient is resting comfortably. 

## 2020-09-09 NOTE — ED Notes (Signed)
This RN explaining upcoming medications to patient at this time. Patient notified of upcoming Remdesivir ordered. Patient refusing medication at this time. Patient re-educated on positive COVID result, and purpose of ordered medication. Patient verbalized understanding, and medication was again declined at this time.

## 2020-09-09 NOTE — ED Notes (Signed)
This RN to bedside to notify patient of medication change. Patient now reports improvement to cramping symptoms and denies any allergic reaction symptoms at this time.

## 2020-09-09 NOTE — ED Notes (Signed)
Patient called out to the nurse's station at this time. Patient states "I am having some type of reaction. I am allergic to something. I having severe pain in the right upper abdomen, and it's cramping." Patient reports pain is different from already reported abdominal pain prior to medication with Dilaudid. No rash, hives, throat closing, itching, or SOB reported by patient. This RN attempted to provide comfort to patient, patient requesting a change in medication or to see the physician. Dr. Sidney Ace notified. New order received for Fentanyl PRN, Dilaudid discontinued. Dr. Sidney Ace reports he will come back to re-evaluate the patient.

## 2020-09-09 NOTE — ED Notes (Signed)
Sent message to Md. Sreenath regarding pt refusing to take PO meds, she stated she would have her husband bring them . Pharmacy tech has spoken to pt regarding med policy

## 2020-09-09 NOTE — ED Notes (Signed)
Patient reports itching. Benadryl provided per PRN order via MAR.

## 2020-09-09 NOTE — ED Notes (Signed)
Dr. Mansy at bedside. 

## 2020-09-09 NOTE — ED Notes (Signed)
Pt called out with crushing right sided pain. Pt anxious and thinks it is an allergic reaction. Fluid bolus given VSS. Talking with pt calmed her down. Crushing pain subsided. Pt without pain at this time

## 2020-09-09 NOTE — ED Notes (Signed)
Pt refused meds advised she was having her husband bring meds will advised MD and Pharmacy

## 2020-09-09 NOTE — Progress Notes (Signed)
PROGRESS NOTE    Paula Moses  DGL:[id number] DOB: January 18, 1966 DOA: 09/08/2020 PCP: [name], MD   Brief Narrative:  55 y.o. Caucasian female with medical history significant for GERD, anxiety, dyslipidemia and hypertension, as well as diverticulitis twice with previous history of perforation, who presented to the emergency room with acute onset of left lower quadrant abdominal pain graded 6-7/10 and associated with nausea without vomiting or diarrhea.  She had a small bowel movement today.  She denied any melena or bright red bleeding per rectum.  Her pain has been radiating to the right lower quadrant as well as the left upper quadrant.  No chest pain or dyspnea or cough.  No dysuria, oliguria or hematuria or flank pain.  Patient was found to be incidentally COVID-positive.  No fever or oxygen requirement.  No respiratory symptoms endorsed.  Patient is unvaccinated   Assessment & Plan:   Active Problems:   Acute diverticulitis  Acute sigmoid diverticulitis with associated phlegmon and associated leukocytosis without sepsis. Patient has a history of diverticulitis CT imaging survey with significant phlegmon No discrete abscess or perforation Patient is endorsing some belly pain though belly is soft Plan: Continue IV Rocephin and IV Flagyl Continue IV fluids Pain control as needed Clear liquid diet for today We will reach out to general surgery should clinical situation deteriorate  Essential hypertension Currently uncontrolled, likely due to pain Continue Diovan As needed labetalol - We will continue Diovan HCT and placed on as needed IV labetalol.  Incidental COVID-19 infection Incidental finding Patient is unvaccinated No fevers or oxygen requirement Patient denies any respiratory symptoms Offered chest x-ray however patient declined Plan: No indication for IV remdesivir, will stop Monitor vitals and fever curve  GERD PPI  Seasonal allergies PTA Flonase  and Singulair  Depression. PTA Wellbutrin   DVT prophylaxis: SCD Code Status: Full Family Communication: None today Disposition Plan: Status is: Inpatient  Remains inpatient appropriate because:Inpatient level of care appropriate due to severity of illness  Dispo: The patient is from: Home              Anticipated d/c is to: Home              Patient currently is not medically stable to d/c.   Difficult to place patient No       Level of care: Med-Surg  Consultants:  None  Procedures:  None  Antimicrobials:  Ceftriaxone Metronidazole   Subjective: Patient seen and examined.  Resting comfortably in bed.  Does endorse abdominal pain.  No other complaints  Objective: Vitals:   09/09/20 0346 09/09/20 0759 09/09/20 1200 09/09/20 1605  BP: 117/70 115/70 101/63 120/69  Pulse: (!) 55 62 (!) 56 60  Resp: 15 16 17 18   Temp:    98 F (36.7 C)  TempSrc:    Oral  SpO2: 97% 98% 96% 96%   No intake or output data in the 24 hours ending 09/09/20 1653 There were no vitals filed for this visit.  Examination:  General exam: Appears calm and comfortable  Respiratory system: Clear to auscultation. Respiratory effort normal. Cardiovascular system: S1 & S2 heard, RRR. No JVD, murmurs, rubs, gallops or clicks. No pedal edema. Gastrointestinal system: Soft, nondistended, tender to palpation lower abdomen.  Normal bowel sounds Central nervous system: Alert and oriented. No focal neurological deficits. Extremities: Symmetric 5 x 5 power. Skin: No rashes, lesions or ulcers Psychiatry: Judgement and insight appear normal. Mood & affect appropriate.  Data Reviewed: I have personally reviewed following labs and imaging studies  CBC: Recent Labs  Lab 09/08/20 1713 09/09/20 0732  WBC 14.1* 11.3*  NEUTROABS  --  7.9*  HGB 14.0 12.0  HCT 39.5 34.8*  MCV 84.8 87.2  PLT 259 481   Basic Metabolic Panel: Recent Labs  Lab 09/08/20 1713 09/09/20 0732  NA 134* 137  K  4.0 4.3  CL 101 102  CO2 27 26  GLUCOSE 83 99  BUN 11 8  CREATININE 0.90 0.94  CALCIUM 8.9 8.5*   GFR: CrCl cannot be calculated (Unknown ideal weight.). Liver Function Tests: Recent Labs  Lab 09/08/20 1713 09/09/20 0732  AST 20 24  ALT 20 21  ALKPHOS 57 50  BILITOT 0.7 0.9  PROT 7.6 6.7  ALBUMIN 4.1 3.6   Recent Labs  Lab 09/08/20 1713  LIPASE 36   No results for input(s): AMMONIA in the last 168 hours. Coagulation Profile: No results for input(s): INR, PROTIME in the last 168 hours. Cardiac Enzymes: No results for input(s): CKTOTAL, CKMB, CKMBINDEX, TROPONINI in the last 168 hours. BNP (last 3 results) No results for input(s): PROBNP in the last 8760 hours. HbA1C: No results for input(s): HGBA1C in the last 72 hours. CBG: No results for input(s): GLUCAP in the last 168 hours. Lipid Profile: No results for input(s): CHOL, HDL, LDLCALC, TRIG, CHOLHDL, LDLDIRECT in the last 72 hours. Thyroid Function Tests: No results for input(s): TSH, T4TOTAL, FREET4, T3FREE, THYROIDAB in the last 72 hours. Anemia Panel: Recent Labs    09/09/20 0011 09/09/20 0732  FERRITIN 23 34   Sepsis Labs: Recent Labs  Lab 09/09/20 0011  PROCALCITON <0.10    Recent Results (from the past 240 hour(s))  Resp Panel by RT-PCR (Flu A&B, Covid) Nasopharyngeal Swab     Status: Abnormal   Collection Time: 09/08/20  9:26 PM   Specimen: Nasopharyngeal Swab; Nasopharyngeal(NP) swabs in vial transport medium  Result Value Ref Range Status   SARS Coronavirus 2 by RT PCR POSITIVE (A) NEGATIVE Final    Comment: RESULT CALLED TO, READ BACK BY AND VERIFIED WITH: RACQUEL DAVIS AT 2259 ON 09/08/20 BY SS (NOTE) SARS-CoV-2 target nucleic acids are DETECTED.  The SARS-CoV-2 RNA is generally detectable in upper respiratory specimens during the acute phase of infection. Positive results are indicative of the presence of the identified virus, but do not rule out bacterial infection or co-infection  with other pathogens not detected by the test. Clinical correlation with patient history and other diagnostic information is necessary to determine patient infection status. The expected result is Negative.  Fact Sheet for Patients: EntrepreneurPulse.com.au  Fact Sheet for Healthcare Providers: IncredibleEmployment.be  This test is not yet approved or cleared by the [place] FDA and  has been authorized for detection and/or diagnosis of SARS-CoV-2 by FDA under an Emergency Use Authorization (EUA).  This EUA will remain in effect (meaning this test c an be used) for the duration of  the COVID-19 declaration under Section 564(b)(1) of the Act, 21 U.S.C. section 360bbb-3(b)(1), unless the authorization is terminated or revoked sooner.     Influenza A by PCR NEGATIVE NEGATIVE Final   Influenza B by PCR NEGATIVE NEGATIVE Final    Comment: (NOTE) The Xpert Xpress SARS-CoV-2/FLU/RSV plus assay is intended as an aid in the diagnosis of influenza from Nasopharyngeal swab specimens and should not be used as a sole basis for treatment. Nasal washings and aspirates are unacceptable for Xpert Xpress SARS-CoV-2/FLU/RSV testing.  Fact  Sheet for Patients: EntrepreneurPulse.com.au  Fact Sheet for Healthcare Providers: IncredibleEmployment.be  This test is not yet approved or cleared by the [place] FDA and has been authorized for detection and/or diagnosis of SARS-CoV-2 by FDA under an Emergency Use Authorization (EUA). This EUA will remain in effect (meaning this test can be used) for the duration of the COVID-19 declaration under Section 564(b)(1) of the Act, 21 U.S.C. section 360bbb-3(b)(1), unless the authorization is terminated or revoked.  Performed at [facility], [address] [place], [place] [address]   Blood culture (single)     Status: None (Preliminary result)   Collection Time:  09/08/20  9:26 PM   Specimen: BLOOD  Result Value Ref Range Status   Specimen Description BLOOD BLOOD LEFT FOREARM  Final   Special Requests   Final    BOTTLES DRAWN AEROBIC AND ANAEROBIC Blood Culture results may not be optimal due to an inadequate volume of blood received in culture bottles   Culture   Final    NO GROWTH < 24 HOURS Performed at [facility], [address] [place], [place] [address]    Report Status PENDING  Incomplete         Radiology Studies: CT ABDOMEN PELVIS W CONTRAST  Result Date: 09/08/2020 CLINICAL DATA:  Nonlocalized abdominal pain which began 2 days prior, history of diverticulitis EXAM: CT ABDOMEN AND PELVIS WITH CONTRAST TECHNIQUE: Multidetector CT imaging of the abdomen and pelvis was performed using the standard protocol following bolus administration of intravenous contrast. CONTRAST:  18mL OMNIPAQUE IOHEXOL 300 MG/ML  SOLN COMPARISON:  CT 03/18/2019 FINDINGS: Lower chest: Bilateral breast prostheses without acute CT evident complication. Lung bases are clear. Normal heart size. No pericardial effusion. Hepatobiliary: No worrisome focal liver lesions. Smooth liver surface contour. Normal hepatic attenuation. Prior cholecystectomy. No significant biliary ductal dilatation accounting for post cholecystectomy reservoir effect. No visible intraductal gallstones. Pancreas: No pancreatic ductal dilatation or surrounding inflammatory changes. Spleen: Normal in size. No concerning splenic lesions. Adrenals/Urinary Tract: Normal adrenals. Kidneys enhance and excrete symmetrically. Stable region of cortical scarring in the upper pole left kidney. No worrisome renal mass. No urolithiasis or hydronephrosis. Portion of the pelvis obscured by streak artifact. Slightly asymmetric bladder wall thickening adjacent a region of left pelvic inflammation is favored to be reactive. No visible bladder calculi or debris. Stomach/Bowel: Distal esophagus, stomach and duodenum  are unremarkable. No small bowel thickening or dilatation. Noninflamed appendix in the right lower quadrant. Proximal colon is unremarkable. There is however more segmental thickening of the proximal to mid sigmoid with extensive surrounding phlegmon and inflammatory changes much of which appears centered on upon a culprit diverticulum abutting the left pelvic sidewall (5/41). Adjacent reactive free fluid is noted. No clear extraluminal gas is present. No organized abscess or collection. Inflammatory changes extend across the left pelvic sidewall with peritoneal thickening and enhancement. Vascular/Lymphatic: Increased vascularity with adjacent reactive appearing lymph nodes in the left lower quadrant in the vicinity of the inflamed sigmoid colon. No other suspicious or pathologically enlarged lymph nodes are seen in the abdomen or pelvis. Atherosclerotic plaque in the abdominal aorta and branch vessels without aneurysm, ectasia or other acute vascular abnormality. Reproductive: Post hysterectomy. Inflammatory changes in the left adnexa related to the colon as above. Other: Extensive phlegmonous changes and peritoneal thickening adjacent the thickened, inflamed sigmoid colon. Some distribution of the low-attenuation free fluid throughout the abdomen and pelvis. No evidence of organized abscess or collection. No free air. No bowel containing  hernia. Portion of the pelvis obscured by streak artifact from bilateral hip prostheses. Musculoskeletal: Bilateral total hip arthroplasties without acute hardware failure, periprosthetic fracture or other complication. Resulting streak artifact. 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 Minimal retrolisthesis L4 on L5 without additional spondylolysis. Multilevel degenerative changes are present in the imaged portions of the spine. IMPRESSION: 1. Circumferentially thickened sigmoid colon centered in a region of numerous colonic diverticula with a larger  inflamed culprit diverticulum along the left pelvic sidewall compatible with an acute diverticulitis. Extensive surrounding phlegmon and features suggestive of adjacent peritonitis albeit without extraluminal gas, organized collection or abscess at this time though please note portions of the pelvis are poorly visualized due to streak artifact from bilateral hip prostheses. This is in a similar location to prior diverticulitis seen on comparison imaging 03/18/2019. 2. Bilateral hip arthroplasty without acute complication. 3. Chronic left renal scarring. 4. Prior cholecystectomy. 5.  Aortic Atherosclerosis (ICD10-I70.0). Electronically Signed   By: Lovena Le M.D.   On: 09/08/2020 20:17        Scheduled Meds:  vitamin C  500 mg Oral Daily   buPROPion  150 mg Oral Daily   cholecalciferol  1,000 Units Oral Daily   irbesartan  150 mg Oral Daily   And   hydrochlorothiazide  12.5 mg Oral Daily   meloxicam  7.5 mg Oral Daily   montelukast  10 mg Oral QHS   zinc sulfate  220 mg Oral Daily   Continuous Infusions:  sodium chloride 100 mL/hr at 09/08/20 2302   cefTRIAXone (ROCEPHIN)  IV     metronidazole Stopped (09/09/20 1600)     LOS: 1 day    Time spent: 25 minutes    Sidney Ace, MD [facility] Pager [phone]  If 7PM-7AM, please contact night-coverage 09/09/2020, 4:53 PM

## 2020-09-10 LAB — CBC WITH DIFFERENTIAL/PLATELET
Abs Immature Granulocytes: 0.03 10*3/uL (ref 0.00–0.07)
Basophils Absolute: 0.1 10*3/uL (ref 0.0–0.1)
Basophils Relative: 1 %
Eosinophils Absolute: 0.2 10*3/uL (ref 0.0–0.5)
Eosinophils Relative: 2 %
HCT: 34.9 % — ABNORMAL LOW (ref 36.0–46.0)
Hemoglobin: 12.2 g/dL (ref 12.0–15.0)
Immature Granulocytes: 0 %
Lymphocytes Relative: 23 %
Lymphs Abs: 2.4 10*3/uL (ref 0.7–4.0)
MCH: 30.8 pg (ref 26.0–34.0)
MCHC: 35 g/dL (ref 30.0–36.0)
MCV: 88.1 fL (ref 80.0–100.0)
Monocytes Absolute: 0.8 10*3/uL (ref 0.1–1.0)
Monocytes Relative: 8 %
Neutro Abs: 7.2 10*3/uL (ref 1.7–7.7)
Neutrophils Relative %: 66 %
Platelets: 186 10*3/uL (ref 150–400)
RBC: 3.96 MIL/uL (ref 3.87–5.11)
RDW: 13.4 % (ref 11.5–15.5)
WBC: 10.7 10*3/uL — ABNORMAL HIGH (ref 4.0–10.5)
nRBC: 0 % (ref 0.0–0.2)

## 2020-09-10 LAB — COMPREHENSIVE METABOLIC PANEL
ALT: 19 U/L (ref 0–44)
AST: 16 U/L (ref 15–41)
Albumin: 3.2 g/dL — ABNORMAL LOW (ref 3.5–5.0)
Alkaline Phosphatase: 49 U/L (ref 38–126)
Anion gap: 6 (ref 5–15)
BUN: 6 mg/dL (ref 6–20)
CO2: 25 mmol/L (ref 22–32)
Calcium: 8 mg/dL — ABNORMAL LOW (ref 8.9–10.3)
Chloride: 105 mmol/L (ref 98–111)
Creatinine, Ser: 0.88 mg/dL (ref 0.44–1.00)
GFR, Estimated: >60 mL/min (ref >60)
Glucose, Bld: 99 mg/dL (ref 70–99)
Potassium: 4.1 mmol/L (ref 3.5–5.1)
Sodium: 136 mmol/L (ref 135–145)
Total Bilirubin: 0.8 mg/dL (ref 0.3–1.2)
Total Protein: 6.3 g/dL — ABNORMAL LOW (ref 6.5–8.1)

## 2020-09-10 NOTE — Progress Notes (Signed)
PROGRESS NOTE    Paula Moses  TTS:[id number] DOB: 1965-09-08 DOA: 09/08/2020 PCP: [name], MD   Brief Narrative:  55 y.o. Caucasian female with medical history significant for GERD, anxiety, dyslipidemia and hypertension, as well as diverticulitis twice with previous history of perforation, who presented to the emergency room with acute onset of left lower quadrant abdominal pain graded 6-7/10 and associated with nausea without vomiting or diarrhea.  She had a small bowel movement today.  She denied any melena or bright red bleeding per rectum.  Her pain has been radiating to the right lower quadrant as well as the left upper quadrant.  No chest pain or dyspnea or cough.  No dysuria, oliguria or hematuria or flank pain.  Patient was found to be incidentally COVID-positive.  No fever or oxygen requirement.  No respiratory symptoms endorsed.  Patient is unvaccinated.  Continues to have symptoms consistent with acute diverticulitis including abdominal pain.  Nonseptic, no leukocytosis or fevers   Assessment & Plan:   Active Problems:   Acute diverticulitis  Acute sigmoid diverticulitis with associated phlegmon and associated leukocytosis without sepsis. Patient has a history of diverticulitis CT imaging survey with significant phlegmon No discrete abscess or perforation Patient is endorsing some belly pain though belly is soft Leukocytosis resolved after 24 hours on IV antibiotics Plan: Continue IV Rocephin and IV Flagyl Continue IV fluids for today Pain control as needed Advance to full liquid diet today Patient is tolerating can advance to soft within 24 hours We will reach out to general surgery should clinical situation deteriorate  Essential hypertension Currently uncontrolled, likely due to pain Continue Diovan As needed labetalol  Incidental COVID-19 infection Incidental finding Patient is unvaccinated No fevers or oxygen requirement Patient denies any  respiratory symptoms Offered chest x-ray however patient declined Plan: Supportive care Monitor vitals and fever curve  GERD PPI  Seasonal allergies PTA Flonase and Singulair  Depression. PTA Wellbutrin   DVT prophylaxis: SCD Code Status: Full Family Communication: [name] [phone] on 6/30 Disposition Plan: Status is: Inpatient  Remains inpatient appropriate because:Inpatient level of care appropriate due to severity of illness  Dispo: The patient is from: Home              Anticipated d/c is to: Home              Patient currently is not medically stable to d/c.   Difficult to place patient No  Patient with acute diverticulitis.  On IV antibiotics.  Also with incidental COVID.     Level of care: Med-Surg  Consultants:  None  Procedures:  None  Antimicrobials:  Ceftriaxone Metronidazole   Subjective: Patient seen and examined.  Resting comfortably in bed.  Does endorse some persistent abdominal pain.  No other complaints.  Objective: Vitals:   09/09/20 1730 09/09/20 2026 09/10/20 0621 09/10/20 0835  BP: 128/90 133/80 127/74 124/84  Pulse: 64 (!) 59 63 60  Resp: 16 16 16 16   Temp: 98.9 F (37.2 C) 98.3 F (36.8 C) 98 F (36.7 C)   TempSrc: Oral Oral    SpO2: 100% 100% 98% 99%    Intake/Output Summary (Last 24 hours) at 09/10/2020 1225 Last data filed at 09/10/2020 0304 Gross per 24 hour  Intake 1106.14 ml  Output --  Net 1106.14 ml   There were no vitals filed for this visit.  Examination:  General exam: Appears calm and comfortable  Respiratory system: Clear to auscultation. Respiratory effort normal. Cardiovascular system: S1-S2,  regular rate and rhythm, no murmurs, no pedal edema  gastrointestinal system: Soft, nondistended, tender to palpation in the lower abdomen.  Normal bowel sounds Central nervous system: Alert and oriented. No focal neurological deficits. Extremities: Symmetric 5 x 5 power. Skin: No rashes, lesions or  ulcers Psychiatry: Judgement and insight appear normal. Mood & affect appropriate.     Data Reviewed: I have personally reviewed following labs and imaging studies  CBC: Recent Labs  Lab 09/08/20 1713 09/09/20 0732 09/10/20 0531  WBC 14.1* 11.3* 10.7*  NEUTROABS  --  7.9* 7.2  HGB 14.0 12.0 12.2  HCT 39.5 34.8* 34.9*  MCV 84.8 87.2 88.1  PLT 259 207 778   Basic Metabolic Panel: Recent Labs  Lab 09/08/20 1713 09/09/20 0732 09/10/20 0531  NA 134* 137 136  K 4.0 4.3 4.1  CL 101 102 105  CO2 27 26 25   GLUCOSE 83 99 99  BUN 11 8 6   CREATININE 0.90 0.94 0.88  CALCIUM 8.9 8.5* 8.0*   GFR: CrCl cannot be calculated (Unknown ideal weight.). Liver Function Tests: Recent Labs  Lab 09/08/20 1713 09/09/20 0732 09/10/20 0531  AST 20 24 16   ALT 20 21 19   ALKPHOS 57 50 49  BILITOT 0.7 0.9 0.8  PROT 7.6 6.7 6.3*  ALBUMIN 4.1 3.6 3.2*   Recent Labs  Lab 09/08/20 1713  LIPASE 36   No results for input(s): AMMONIA in the last 168 hours. Coagulation Profile: No results for input(s): INR, PROTIME in the last 168 hours. Cardiac Enzymes: No results for input(s): CKTOTAL, CKMB, CKMBINDEX, TROPONINI in the last 168 hours. BNP (last 3 results) No results for input(s): PROBNP in the last 8760 hours. HbA1C: No results for input(s): HGBA1C in the last 72 hours. CBG: No results for input(s): GLUCAP in the last 168 hours. Lipid Profile: No results for input(s): CHOL, HDL, LDLCALC, TRIG, CHOLHDL, LDLDIRECT in the last 72 hours. Thyroid Function Tests: No results for input(s): TSH, T4TOTAL, FREET4, T3FREE, THYROIDAB in the last 72 hours. Anemia Panel: Recent Labs    09/09/20 0011 09/09/20 0732  FERRITIN 23 34   Sepsis Labs: Recent Labs  Lab 09/09/20 0011  PROCALCITON <0.10    Recent Results (from the past 240 hour(s))  Resp Panel by RT-PCR (Flu A&B, Covid) Nasopharyngeal Swab     Status: Abnormal   Collection Time: 09/08/20  9:26 PM   Specimen: Nasopharyngeal  Swab; Nasopharyngeal(NP) swabs in vial transport medium  Result Value Ref Range Status   SARS Coronavirus 2 by RT PCR POSITIVE (A) NEGATIVE Final    Comment: RESULT CALLED TO, READ BACK BY AND VERIFIED WITH: RACQUEL DAVIS AT 2259 ON 09/08/20 BY SS (NOTE) SARS-CoV-2 target nucleic acids are DETECTED.  The SARS-CoV-2 RNA is generally detectable in upper respiratory specimens during the acute phase of infection. Positive results are indicative of the presence of the identified virus, but do not rule out bacterial infection or co-infection with other pathogens not detected by the test. Clinical correlation with patient history and other diagnostic information is necessary to determine patient infection status. The expected result is Negative.  Fact Sheet for Patients: EntrepreneurPulse.com.au  Fact Sheet for Healthcare Providers: IncredibleEmployment.be  This test is not yet approved or cleared by the [place] FDA and  has been authorized for detection and/or diagnosis of SARS-CoV-2 by FDA under an Emergency Use Authorization (EUA).  This EUA will remain in effect (meaning this test c an be used) for the duration of  the COVID-19 declaration  under Section 564(b)(1) of the Act, 21 U.S.C. section 360bbb-3(b)(1), unless the authorization is terminated or revoked sooner.     Influenza A by PCR NEGATIVE NEGATIVE Final   Influenza B by PCR NEGATIVE NEGATIVE Final    Comment: (NOTE) The Xpert Xpress SARS-CoV-2/FLU/RSV plus assay is intended as an aid in the diagnosis of influenza from Nasopharyngeal swab specimens and should not be used as a sole basis for treatment. Nasal washings and aspirates are unacceptable for Xpert Xpress SARS-CoV-2/FLU/RSV testing.  Fact Sheet for Patients: EntrepreneurPulse.com.au  Fact Sheet for Healthcare Providers: IncredibleEmployment.be  This test is not yet approved or cleared  by the [place] FDA and has been authorized for detection and/or diagnosis of SARS-CoV-2 by FDA under an Emergency Use Authorization (EUA). This EUA will remain in effect (meaning this test can be used) for the duration of the COVID-19 declaration under Section 564(b)(1) of the Act, 21 U.S.C. section 360bbb-3(b)(1), unless the authorization is terminated or revoked.  Performed at [facility], [address] [place], [place] [address]   Blood culture (single)     Status: None (Preliminary result)   Collection Time: 09/08/20  9:26 PM   Specimen: BLOOD  Result Value Ref Range Status   Specimen Description BLOOD BLOOD LEFT FOREARM  Final   Special Requests   Final    BOTTLES DRAWN AEROBIC AND ANAEROBIC Blood Culture results may not be optimal due to an inadequate volume of blood received in culture bottles   Culture   Final    NO GROWTH 2 DAYS Performed at [facility], [address], [place], [place] [address]    Report Status PENDING  Incomplete         Radiology Studies: CT ABDOMEN PELVIS W CONTRAST  Result Date: 09/08/2020 CLINICAL DATA:  Nonlocalized abdominal pain which began 2 days prior, history of diverticulitis EXAM: CT ABDOMEN AND PELVIS WITH CONTRAST TECHNIQUE: Multidetector CT imaging of the abdomen and pelvis was performed using the standard protocol following bolus administration of intravenous contrast. CONTRAST:  121mL OMNIPAQUE IOHEXOL 300 MG/ML  SOLN COMPARISON:  CT 03/18/2019 FINDINGS: Lower chest: Bilateral breast prostheses without acute CT evident complication. Lung bases are clear. Normal heart size. No pericardial effusion. Hepatobiliary: No worrisome focal liver lesions. Smooth liver surface contour. Normal hepatic attenuation. Prior cholecystectomy. No significant biliary ductal dilatation accounting for post cholecystectomy reservoir effect. No visible intraductal gallstones. Pancreas: No pancreatic ductal dilatation or surrounding  inflammatory changes. Spleen: Normal in size. No concerning splenic lesions. Adrenals/Urinary Tract: Normal adrenals. Kidneys enhance and excrete symmetrically. Stable region of cortical scarring in the upper pole left kidney. No worrisome renal mass. No urolithiasis or hydronephrosis. Portion of the pelvis obscured by streak artifact. Slightly asymmetric bladder wall thickening adjacent a region of left pelvic inflammation is favored to be reactive. No visible bladder calculi or debris. Stomach/Bowel: Distal esophagus, stomach and duodenum are unremarkable. No small bowel thickening or dilatation. Noninflamed appendix in the right lower quadrant. Proximal colon is unremarkable. There is however more segmental thickening of the proximal to mid sigmoid with extensive surrounding phlegmon and inflammatory changes much of which appears centered on upon a culprit diverticulum abutting the left pelvic sidewall (5/41). Adjacent reactive free fluid is noted. No clear extraluminal gas is present. No organized abscess or collection. Inflammatory changes extend across the left pelvic sidewall with peritoneal thickening and enhancement. Vascular/Lymphatic: Increased vascularity with adjacent reactive appearing lymph nodes in the left lower quadrant in the vicinity of the inflamed sigmoid  colon. No other suspicious or pathologically enlarged lymph nodes are seen in the abdomen or pelvis. Atherosclerotic plaque in the abdominal aorta and branch vessels without aneurysm, ectasia or other acute vascular abnormality. Reproductive: Post hysterectomy. Inflammatory changes in the left adnexa related to the colon as above. Other: Extensive phlegmonous changes and peritoneal thickening adjacent the thickened, inflamed sigmoid colon. Some distribution of the low-attenuation free fluid throughout the abdomen and pelvis. No evidence of organized abscess or collection. No free air. No bowel containing hernia. Portion of the pelvis obscured  by streak artifact from bilateral hip prostheses. Musculoskeletal: Bilateral total hip arthroplasties without acute hardware failure, periprosthetic fracture or other complication. Resulting streak artifact. 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 Minimal retrolisthesis L4 on L5 without additional spondylolysis. Multilevel degenerative changes are present in the imaged portions of the spine. IMPRESSION: 1. Circumferentially thickened sigmoid colon centered in a region of numerous colonic diverticula with a larger inflamed culprit diverticulum along the left pelvic sidewall compatible with an acute diverticulitis. Extensive surrounding phlegmon and features suggestive of adjacent peritonitis albeit without extraluminal gas, organized collection or abscess at this time though please note portions of the pelvis are poorly visualized due to streak artifact from bilateral hip prostheses. This is in a similar location to prior diverticulitis seen on comparison imaging 03/18/2019. 2. Bilateral hip arthroplasty without acute complication. 3. Chronic left renal scarring. 4. Prior cholecystectomy. 5.  Aortic Atherosclerosis (ICD10-I70.0). Electronically Signed   By: Lovena Le M.D.   On: 09/08/2020 20:17        Scheduled Meds:  vitamin C  500 mg Oral Daily   buPROPion  150 mg Oral Daily   cholecalciferol  1,000 Units Oral Daily   irbesartan  150 mg Oral Daily   And   hydrochlorothiazide  12.5 mg Oral Daily   meloxicam  7.5 mg Oral Daily   montelukast  10 mg Oral QHS   zinc sulfate  220 mg Oral Daily   Continuous Infusions:  sodium chloride 100 mL/hr at 09/10/20 0304   cefTRIAXone (ROCEPHIN)  IV Stopped (09/09/20 1812)   metronidazole 500 mg (09/10/20 0617)     LOS: 2 days    Time spent: 25 minutes    Sidney Ace, MD [facility] Pager [phone]  If 7PM-7AM, please contact night-coverage 09/10/2020, 12:25 PM

## 2020-09-11 LAB — CBC WITH DIFFERENTIAL/PLATELET
Abs Immature Granulocytes: 0.03 10*3/uL (ref 0.00–0.07)
Basophils Absolute: 0 10*3/uL (ref 0.0–0.1)
Basophils Relative: 1 %
Eosinophils Absolute: 0.2 10*3/uL (ref 0.0–0.5)
Eosinophils Relative: 2 %
HCT: 31.7 % — ABNORMAL LOW (ref 36.0–46.0)
Hemoglobin: 11.2 g/dL — ABNORMAL LOW (ref 12.0–15.0)
Immature Granulocytes: 0 %
Lymphocytes Relative: 32 %
Lymphs Abs: 2.6 10*3/uL (ref 0.7–4.0)
MCH: 30.3 pg (ref 26.0–34.0)
MCHC: 35.3 g/dL (ref 30.0–36.0)
MCV: 85.7 fL (ref 80.0–100.0)
Monocytes Absolute: 0.6 10*3/uL (ref 0.1–1.0)
Monocytes Relative: 8 %
Neutro Abs: 4.6 10*3/uL (ref 1.7–7.7)
Neutrophils Relative %: 57 %
Platelets: 182 10*3/uL (ref 150–400)
RBC: 3.7 MIL/uL — ABNORMAL LOW (ref 3.87–5.11)
RDW: 13.3 % (ref 11.5–15.5)
WBC: 8.1 10*3/uL (ref 4.0–10.5)
nRBC: 0 % (ref 0.0–0.2)

## 2020-09-11 LAB — COMPREHENSIVE METABOLIC PANEL
ALT: 17 U/L (ref 0–44)
AST: 13 U/L — ABNORMAL LOW (ref 15–41)
Albumin: 3.2 g/dL — ABNORMAL LOW (ref 3.5–5.0)
Alkaline Phosphatase: 44 U/L (ref 38–126)
Anion gap: 5 (ref 5–15)
BUN: 8 mg/dL (ref 6–20)
CO2: 25 mmol/L (ref 22–32)
Calcium: 8.3 mg/dL — ABNORMAL LOW (ref 8.9–10.3)
Chloride: 108 mmol/L (ref 98–111)
Creatinine, Ser: 0.98 mg/dL (ref 0.44–1.00)
GFR, Estimated: >60 mL/min (ref >60)
Glucose, Bld: 95 mg/dL (ref 70–99)
Potassium: 4.1 mmol/L (ref 3.5–5.1)
Sodium: 138 mmol/L (ref 135–145)
Total Bilirubin: 0.6 mg/dL (ref 0.3–1.2)
Total Protein: 6.2 g/dL — ABNORMAL LOW (ref 6.5–8.1)

## 2020-09-11 MED ORDER — DICYCLOMINE HCL 10 MG PO CAPS: 10.0000 mg | ORAL_CAPSULE | Freq: Four times a day (QID) | ORAL | 0 refills | Status: DC | PRN

## 2020-09-11 MED ORDER — AMOXICILLIN-POT CLAVULANATE 875-125 MG PO TABS: 1.0000 | ORAL_TABLET | Freq: Two times a day (BID) | ORAL | 0 refills | Status: AC

## 2020-09-11 NOTE — Discharge Summary (Signed)
Physician Discharge Summary  Paula Moses ZHG:[id number] DOB: 03-Oct-1965 DOA: 09/08/2020  PCP: [name], MD  Admit date: 09/08/2020 Discharge date: 09/11/2020  Admitted From: Home Disposition: Home  Recommendations for Outpatient Follow-up:  Follow up with PCP in 1-2 weeks Referral placed for general surgery evaluation as outpatient  Home Health: No Equipment/Devices: None  Discharge Condition: Stable CODE STATUS: Full Diet recommendation: Soft/bland  Brief/Interim Summary: 55 y.o. Caucasian female with medical history significant for GERD, anxiety, dyslipidemia and hypertension, as well as diverticulitis twice with previous history of perforation, who presented to the emergency room with acute onset of left lower quadrant abdominal pain graded 6-7/10 and associated with nausea without vomiting or diarrhea.  She had a small bowel movement today.  She denied any melena or bright red bleeding per rectum.  Her pain has been radiating to the right lower quadrant as well as the left upper quadrant.  No chest pain or dyspnea or cough.  No dysuria, oliguria or hematuria or flank pain.   Patient was found to be incidentally COVID-positive.  No fever or oxygen requirement.  No respiratory symptoms endorsed.  Patient is unvaccinated  Patient responded to conservative management with antibiotics, bowel rest, IV fluids.  Abdominal pain improved.  I will slowly advance to soft diet on the day of discharge.  Patient tolerated breakfast and lunch without issue.  No abdominal pain nausea or vomiting endorsed.  Stable for discharge at this time.  Given severity of diverticulitis and recurrence of acute episode I have placed a referral to general surgery for outpatient evaluation  Discharge Diagnoses:  Active Problems:   Acute diverticulitis  Acute sigmoid diverticulitis with associated phlegmon and associated leukocytosis without sepsis. Patient has a history of diverticulitis CT imaging survey  with significant phlegmon No discrete abscess or perforation Patient is endorsing some belly pain though belly is soft Abdominal pain and nausea and vomiting resolved at time of discharge Discontinue IV antibiotics Initiate p.o. Augmentin, complete additional 7 days Discharge home with instructions to maintain soft diet and adequate hydration Ambulatory referral placed to general surgery for outpatient evaluation  Essential hypertension Currently uncontrolled, likely due to pain Improved at time of discharge.  Patient can continue home regimen  Incidental COVID-19 infection Incidental finding Patient is unvaccinated No fevers or oxygen requirement Patient denies any respiratory symptoms Offered chest x-ray however patient declined No COVID-specific treatment on discharge  GERD PPI  Seasonal allergies PTA Flonase and Singulair  Depression. PTA Wellbutrin  Discharge Instructions  Discharge Instructions     Ambulatory referral to General Surgery   Complete by: As directed    Diet - low sodium heart healthy   Complete by: As directed    Increase activity slowly   Complete by: As directed       Allergies as of 09/11/2020       Reactions   Aspirin Shortness Of Breath, Other (See Comments)   congestion   Dilaudid [hydromorphone] Shortness Of Breath   Codeine Itching, Other (See Comments)   Facial itching    Percocet [oxycodone-acetaminophen] Itching, Other (See Comments)   Facial itching    Phenergan [promethazine Hcl] Itching, Other (See Comments)   Facial itching    Tape Other (See Comments)   Surgical tape caused blisters   Tramadol    Nausea         Medication List     STOP taking these medications    estradiol 0.05 mg/24hr patch Commonly known as: CLIMARA - Dosed in  mg/24 hr   gabapentin 300 MG capsule Commonly known as: NEURONTIN   HAIR/SKIN/NAILS PO   hydrocortisone-pramoxine rectal foam Commonly known as: PROCTOFOAM-HC   NON FORMULARY    OVER THE COUNTER MEDICATION       TAKE these medications    amoxicillin-clavulanate 875-125 MG tablet Commonly known as: Augmentin Take 1 tablet by mouth 2 (two) times daily for 7 days. Start taking on: September 12, 2020   Azelastine HCl 137 MCG/SPRAY Soln Place 2 sprays into both nostrils 2 (two) times daily.   buPROPion 150 MG 24 hr tablet Commonly known as: Wellbutrin XL Take 1 tablet (150 mg total) by mouth daily.   dibucaine 1 % Oint Commonly known as: NUPERCAINAL Place 1 application rectally as needed for hemorrhoids.   dicyclomine 10 MG capsule Commonly known as: Bentyl Take 1 capsule (10 mg total) by mouth 4 (four) times daily as needed for up to 7 days for spasms (Abdominal cramps).   fluticasone 50 MCG/ACT nasal spray Commonly known as: FLONASE SPRAY TWICE IN EACH NOSTRIL ONCE DAILY   furosemide 20 MG tablet Commonly known as: LASIX Take 1 tablet (20 mg total) by mouth daily as needed. For fluid retention   meloxicam 7.5 MG tablet Commonly known as: MOBIC Take 1 tablet by mouth once daily   MiraLax 17 g packet Generic drug: polyethylene glycol Take 17 g by mouth daily as needed for mild constipation.   montelukast 10 MG tablet Commonly known as: SINGULAIR Take 10 mg by mouth at bedtime.   omeprazole 20 MG tablet Commonly known as: PRILOSEC OTC Take 20 mg by mouth daily as needed (acid reflux).   PRESCRIPTION MEDICATION Testosterone and estrogen pellet implants in buttocks   progesterone 100 MG capsule Commonly known as: PROMETRIUM Take 100-200 mg by mouth at bedtime.   rizatriptan 5 MG tablet Commonly known as: MAXALT Take 5 mg by mouth every 2 (two) hours as needed for migraine.   valsartan-hydrochlorothiazide 160-12.5 MG tablet Commonly known as: DIOVAN-HCT Take 1 tablet by mouth once daily        Allergies  Allergen Reactions   Aspirin Shortness Of Breath and Other (See Comments)    congestion   Dilaudid [Hydromorphone] Shortness Of  Breath   Codeine Itching and Other (See Comments)    Facial itching    Percocet [Oxycodone-Acetaminophen] Itching and Other (See Comments)    Facial itching    Phenergan [Promethazine Hcl] Itching and Other (See Comments)    Facial itching    Tape Other (See Comments)    Surgical tape caused blisters   Tramadol     Nausea     Consultations: None   Procedures/Studies: CT ABDOMEN PELVIS W CONTRAST  Result Date: 09/08/2020 CLINICAL DATA:  Nonlocalized abdominal pain which began 2 days prior, history of diverticulitis EXAM: CT ABDOMEN AND PELVIS WITH CONTRAST TECHNIQUE: Multidetector CT imaging of the abdomen and pelvis was performed using the standard protocol following bolus administration of intravenous contrast. CONTRAST:  13mL OMNIPAQUE IOHEXOL 300 MG/ML  SOLN COMPARISON:  CT 03/18/2019 FINDINGS: Lower chest: Bilateral breast prostheses without acute CT evident complication. Lung bases are clear. Normal heart size. No pericardial effusion. Hepatobiliary: No worrisome focal liver lesions. Smooth liver surface contour. Normal hepatic attenuation. Prior cholecystectomy. No significant biliary ductal dilatation accounting for post cholecystectomy reservoir effect. No visible intraductal gallstones. Pancreas: No pancreatic ductal dilatation or surrounding inflammatory changes. Spleen: Normal in size. No concerning splenic lesions. Adrenals/Urinary Tract: Normal adrenals. Kidneys enhance and excrete symmetrically. Stable  region of cortical scarring in the upper pole left kidney. No worrisome renal mass. No urolithiasis or hydronephrosis. Portion of the pelvis obscured by streak artifact. Slightly asymmetric bladder wall thickening adjacent a region of left pelvic inflammation is favored to be reactive. No visible bladder calculi or debris. Stomach/Bowel: Distal esophagus, stomach and duodenum are unremarkable. No small bowel thickening or dilatation. Noninflamed appendix in the right lower quadrant.  Proximal colon is unremarkable. There is however more segmental thickening of the proximal to mid sigmoid with extensive surrounding phlegmon and inflammatory changes much of which appears centered on upon a culprit diverticulum abutting the left pelvic sidewall (5/41). Adjacent reactive free fluid is noted. No clear extraluminal gas is present. No organized abscess or collection. Inflammatory changes extend across the left pelvic sidewall with peritoneal thickening and enhancement. Vascular/Lymphatic: Increased vascularity with adjacent reactive appearing lymph nodes in the left lower quadrant in the vicinity of the inflamed sigmoid colon. No other suspicious or pathologically enlarged lymph nodes are seen in the abdomen or pelvis. Atherosclerotic plaque in the abdominal aorta and branch vessels without aneurysm, ectasia or other acute vascular abnormality. Reproductive: Post hysterectomy. Inflammatory changes in the left adnexa related to the colon as above. Other: Extensive phlegmonous changes and peritoneal thickening adjacent the thickened, inflamed sigmoid colon. Some distribution of the low-attenuation free fluid throughout the abdomen and pelvis. No evidence of organized abscess or collection. No free air. No bowel containing hernia. Portion of the pelvis obscured by streak artifact from bilateral hip prostheses. Musculoskeletal: Bilateral total hip arthroplasties without acute hardware failure, periprosthetic fracture or other complication. Resulting streak artifact. 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 Minimal retrolisthesis L4 on L5 without additional spondylolysis. Multilevel degenerative changes are present in the imaged portions of the spine. IMPRESSION: 1. Circumferentially thickened sigmoid colon centered in a region of numerous colonic diverticula with a larger inflamed culprit diverticulum along the left pelvic sidewall compatible with an acute diverticulitis. Extensive  surrounding phlegmon and features suggestive of adjacent peritonitis albeit without extraluminal gas, organized collection or abscess at this time though please note portions of the pelvis are poorly visualized due to streak artifact from bilateral hip prostheses. This is in a similar location to prior diverticulitis seen on comparison imaging 03/18/2019. 2. Bilateral hip arthroplasty without acute complication. 3. Chronic left renal scarring. 4. Prior cholecystectomy. 5.  Aortic Atherosclerosis (ICD10-I70.0). Electronically Signed   By: Lovena Le M.D.   On: 09/08/2020 20:17   (Echo, Carotid, EGD, Colonoscopy, ERCP)    Subjective: Seen and examined the day of discharge.  Stable no distress.  Tolerating soft diet.  Stable for discharge home.  Discharge Exam: Vitals:   09/11/20 0614 09/11/20 0844  BP: 130/76 (!) 159/79  Pulse: (!) 55   Resp: 16 17  Temp: 97.9 F (36.6 C) 97.8 F (36.6 C)  SpO2: 98% 100%   Vitals:   09/10/20 0835 09/10/20 1926 09/11/20 0614 09/11/20 0844  BP: 124/84 (!) 145/74 130/76 (!) 159/79  Pulse: 60 62 (!) 55   Resp: 16 20 16 17   Temp:  98.3 F (36.8 C) 97.9 F (36.6 C) 97.8 F (36.6 C)  TempSrc:  Oral Oral Oral  SpO2: 99% 97% 98% 100%    General: Pt is alert, awake, not in acute distress Cardiovascular: RRR, S1/S2 +, no rubs, no gallops Respiratory: CTA bilaterally, no wheezing, no rhonchi Abdominal: Soft, NT, ND, bowel sounds + Extremities: no edema, no cyanosis    The results of  significant diagnostics from this hospitalization (including imaging, microbiology, ancillary and laboratory) are listed below for reference.     Microbiology: Recent Results (from the past 240 hour(s))  Resp Panel by RT-PCR (Flu A&B, Covid) Nasopharyngeal Swab     Status: Abnormal   Collection Time: 09/08/20  9:26 PM   Specimen: Nasopharyngeal Swab; Nasopharyngeal(NP) swabs in vial transport medium  Result Value Ref Range Status   SARS Coronavirus 2 by RT PCR POSITIVE  (A) NEGATIVE Final    Comment: RESULT CALLED TO, READ BACK BY AND VERIFIED WITH: RACQUEL DAVIS AT 2259 ON 09/08/20 BY SS (NOTE) SARS-CoV-2 target nucleic acids are DETECTED.  The SARS-CoV-2 RNA is generally detectable in upper respiratory specimens during the acute phase of infection. Positive results are indicative of the presence of the identified virus, but do not rule out bacterial infection or co-infection with other pathogens not detected by the test. Clinical correlation with patient history and other diagnostic information is necessary to determine patient infection status. The expected result is Negative.  Fact Sheet for Patients: EntrepreneurPulse.com.au  Fact Sheet for Healthcare Providers: IncredibleEmployment.be  This test is not yet approved or cleared by the [place] FDA and  has been authorized for detection and/or diagnosis of SARS-CoV-2 by FDA under an Emergency Use Authorization (EUA).  This EUA will remain in effect (meaning this test c an be used) for the duration of  the COVID-19 declaration under Section 564(b)(1) of the Act, 21 U.S.C. section 360bbb-3(b)(1), unless the authorization is terminated or revoked sooner.     Influenza A by PCR NEGATIVE NEGATIVE Final   Influenza B by PCR NEGATIVE NEGATIVE Final    Comment: (NOTE) The Xpert Xpress SARS-CoV-2/FLU/RSV plus assay is intended as an aid in the diagnosis of influenza from Nasopharyngeal swab specimens and should not be used as a sole basis for treatment. Nasal washings and aspirates are unacceptable for Xpert Xpress SARS-CoV-2/FLU/RSV testing.  Fact Sheet for Patients: EntrepreneurPulse.com.au  Fact Sheet for Healthcare Providers: IncredibleEmployment.be  This test is not yet approved or cleared by the [place] FDA and has been authorized for detection and/or diagnosis of SARS-CoV-2 by FDA under an Emergency Use  Authorization (EUA). This EUA will remain in effect (meaning this test can be used) for the duration of the COVID-19 declaration under Section 564(b)(1) of the Act, 21 U.S.C. section 360bbb-3(b)(1), unless the authorization is terminated or revoked.  Performed at [facility], [address] [place], [place] [address]   Blood culture (single)     Status: None (Preliminary result)   Collection Time: 09/08/20  9:26 PM   Specimen: BLOOD  Result Value Ref Range Status   Specimen Description BLOOD BLOOD LEFT FOREARM  Final   Special Requests   Final    BOTTLES DRAWN AEROBIC AND ANAEROBIC Blood Culture results may not be optimal due to an inadequate volume of blood received in culture bottles   Culture   Final    NO GROWTH 3 DAYS Performed at [facility], [address] [place], [place] [address]    Report Status PENDING  Incomplete     Labs: BNP (last 3 results) Recent Labs    09/09/20 0011  BNP 26.7   Basic Metabolic Panel: Recent Labs  Lab 09/08/20 1713 09/09/20 0732 09/10/20 0531 09/11/20 0633  NA 134* 137 136 138  K 4.0 4.3 4.1 4.1  CL 101 102 105 108  CO2 27 26 25 25   GLUCOSE 83 99 99 95  BUN 11  8 6 8   CREATININE 0.90 0.94 0.88 0.98  CALCIUM 8.9 8.5* 8.0* 8.3*   Liver Function Tests: Recent Labs  Lab 09/08/20 1713 09/09/20 0732 09/10/20 0531 09/11/20 0633  AST 20 24 16  13*  ALT 20 21 19 17   ALKPHOS 57 50 49 44  BILITOT 0.7 0.9 0.8 0.6  PROT 7.6 6.7 6.3* 6.2*  ALBUMIN 4.1 3.6 3.2* 3.2*   Recent Labs  Lab 09/08/20 1713  LIPASE 36   No results for input(s): AMMONIA in the last 168 hours. CBC: Recent Labs  Lab 09/08/20 1713 09/09/20 0732 09/10/20 0531 09/11/20 0633  WBC 14.1* 11.3* 10.7* 8.1  NEUTROABS  --  7.9* 7.2 4.6  HGB 14.0 12.0 12.2 11.2*  HCT 39.5 34.8* 34.9* 31.7*  MCV 84.8 87.2 88.1 85.7  PLT 259 207 186 182   Cardiac Enzymes: No results for input(s): CKTOTAL, CKMB, CKMBINDEX, TROPONINI in the last 168  hours. BNP: Invalid input(s): POCBNP CBG: No results for input(s): GLUCAP in the last 168 hours. D-Dimer Recent Labs    09/09/20 0011 09/09/20 0732  DDIMER 0.35 0.38   Hgb A1c No results for input(s): HGBA1C in the last 72 hours. Lipid Profile No results for input(s): CHOL, HDL, LDLCALC, TRIG, CHOLHDL, LDLDIRECT in the last 72 hours. Thyroid function studies No results for input(s): TSH, T4TOTAL, T3FREE, THYROIDAB in the last 72 hours.  Invalid input(s): FREET3 Anemia work up Recent Labs    09/09/20 0011 09/09/20 0732  FERRITIN 23 34   Urinalysis    Component Value Date/Time   COLORURINE YELLOW (A) 09/08/2020 1713   APPEARANCEUR HAZY (A) 09/08/2020 1713   LABSPEC 1.017 09/08/2020 1713   PHURINE 9.0 (H) 09/08/2020 1713   GLUCOSEU NEGATIVE 09/08/2020 1713   GLUCOSEU NEGATIVE 09/04/2019 1220   HGBUR NEGATIVE 09/08/2020 1713   BILIRUBINUR NEGATIVE 09/08/2020 1713   BILIRUBINUR neg 01/26/2017 [place] 09/08/2020 1713   PROTEINUR NEGATIVE 09/08/2020 1713   UROBILINOGEN 0.2 09/04/2019 1220   NITRITE NEGATIVE 09/08/2020 1713   LEUKOCYTESUR NEGATIVE 09/08/2020 1713   Sepsis Labs Invalid input(s): PROCALCITONIN,  WBC,  LACTICIDVEN Microbiology Recent Results (from the past 240 hour(s))  Resp Panel by RT-PCR (Flu A&B, Covid) Nasopharyngeal Swab     Status: Abnormal   Collection Time: 09/08/20  9:26 PM   Specimen: Nasopharyngeal Swab; Nasopharyngeal(NP) swabs in vial transport medium  Result Value Ref Range Status   SARS Coronavirus 2 by RT PCR POSITIVE (A) NEGATIVE Final    Comment: RESULT CALLED TO, READ BACK BY AND VERIFIED WITH: RACQUEL DAVIS AT 2259 ON 09/08/20 BY SS (NOTE) SARS-CoV-2 target nucleic acids are DETECTED.  The SARS-CoV-2 RNA is generally detectable in upper respiratory specimens during the acute phase of infection. Positive results are indicative of the presence of the identified virus, but do not rule out bacterial infection or  co-infection with other pathogens not detected by the test. Clinical correlation with patient history and other diagnostic information is necessary to determine patient infection status. The expected result is Negative.  Fact Sheet for Patients: EntrepreneurPulse.com.au  Fact Sheet for Healthcare Providers: IncredibleEmployment.be  This test is not yet approved or cleared by the [place] FDA and  has been authorized for detection and/or diagnosis of SARS-CoV-2 by FDA under an Emergency Use Authorization (EUA).  This EUA will remain in effect (meaning this test c an be used) for the duration of  the COVID-19 declaration under Section 564(b)(1) of the Act, 21 U.S.C. section 360bbb-3(b)(1), unless the authorization  is terminated or revoked sooner.     Influenza A by PCR NEGATIVE NEGATIVE Final   Influenza B by PCR NEGATIVE NEGATIVE Final    Comment: (NOTE) The Xpert Xpress SARS-CoV-2/FLU/RSV plus assay is intended as an aid in the diagnosis of influenza from Nasopharyngeal swab specimens and should not be used as a sole basis for treatment. Nasal washings and aspirates are unacceptable for Xpert Xpress SARS-CoV-2/FLU/RSV testing.  Fact Sheet for Patients: EntrepreneurPulse.com.au  Fact Sheet for Healthcare Providers: IncredibleEmployment.be  This test is not yet approved or cleared by the [place] FDA and has been authorized for detection and/or diagnosis of SARS-CoV-2 by FDA under an Emergency Use Authorization (EUA). This EUA will remain in effect (meaning this test can be used) for the duration of the COVID-19 declaration under Section 564(b)(1) of the Act, 21 U.S.C. section 360bbb-3(b)(1), unless the authorization is terminated or revoked.  Performed at [facility], [address] [place], [place] [address]   Blood culture (single)     Status: None (Preliminary result)    Collection Time: 09/08/20  9:26 PM   Specimen: BLOOD  Result Value Ref Range Status   Specimen Description BLOOD BLOOD LEFT FOREARM  Final   Special Requests   Final    BOTTLES DRAWN AEROBIC AND ANAEROBIC Blood Culture results may not be optimal due to an inadequate volume of blood received in culture bottles   Culture   Final    NO GROWTH 3 DAYS Performed at [facility], [address] [place], [place] [address]    Report Status PENDING  Incomplete     Time coordinating discharge: Over 30 minutes  SIGNED:   Sidney Ace, MD  [facility] 09/11/2020, 1:28 PM Pager   If 7PM-7AM, please contact night-coverage

## 2020-09-15 ENCOUNTER — Telehealth: Payer: Self-pay

## 2020-09-15 LAB — CULTURE, BLOOD (SINGLE): Culture: NO GROWTH

## 2020-09-15 NOTE — Telephone Encounter (Signed)
Transition Care Management Follow-up Telephone Call Date of discharge and from where: 09/11/20 from [facility]. How have you been since you were released from the hospital? Doing well today. Taken Bentyl only one time since discharge. Diarrhea present and the beginning of a yeast infection which patient believes is directly related to continued antibiotic. Eating yogurt and plans to start probiotic. Denies nausea, vomiting, bleeding per rectum, abd pain, chest pain, flank pain, fever and all other symptoms.  Any questions or concerns? Why am I being referred to a general surgeon and what food plan should I eat going forward to prevent another flare up?  Items Reviewed: Did the pt receive and understand the discharge instructions provided? Yes  Medications obtained and verified? Yes  Any new allergies since your discharge? No  Dietary orders reviewed? Yes Do you have support at home? Yes   Home Care and Equipment/Supplies: Were home health services ordered? no  Functional Questionnaire: (I = Independent and D = Dependent) ADLs: I   Follow up appointments reviewed:  PCP Hospital f/u appt? Awaiting call back for scheduling. No appointment scheduled at this time.   [facility] f/u appt?  Waiting to schedule with General Surgery after HFU with PCP. Are transportation arrangements needed? No  If their condition worsens, is the pt aware to call PCP or go to the Emergency Dept.? Yes Was the patient provided with contact information for the PCP's office or ED? Yes Was to pt encouraged to call back with questions or concerns? Yes

## 2020-09-16 NOTE — Telephone Encounter (Signed)
What time would you want to work her in on July 14th?

## 2020-09-16 NOTE — Telephone Encounter (Signed)
LMTCB

## 2020-09-16 NOTE — Telephone Encounter (Signed)
Spoke with patient scheduled for 7/14 @ 1p.

## 2020-09-23 ENCOUNTER — Other Ambulatory Visit: Payer: Self-pay | Admitting: Internal Medicine

## 2020-09-24 ENCOUNTER — Encounter: Payer: Self-pay | Admitting: Internal Medicine

## 2020-09-24 ENCOUNTER — Ambulatory Visit (INDEPENDENT_AMBULATORY_CARE_PROVIDER_SITE_OTHER): Payer: 59 | Admitting: Internal Medicine

## 2020-09-24 ENCOUNTER — Other Ambulatory Visit: Payer: Self-pay

## 2020-09-24 VITALS — BP 122/68 | HR 64 | Temp 98.1°F | Ht 63.5 in | Wt 183.8 lb

## 2020-09-24 DIAGNOSIS — I10 Essential (primary) hypertension: Secondary | ICD-10-CM

## 2020-09-24 DIAGNOSIS — R109 Unspecified abdominal pain: Secondary | ICD-10-CM

## 2020-09-24 DIAGNOSIS — Z09 Encounter for follow-up examination after completed treatment for conditions other than malignant neoplasm: Secondary | ICD-10-CM

## 2020-09-24 DIAGNOSIS — K5792 Diverticulitis of intestine, part unspecified, without perforation or abscess without bleeding: Secondary | ICD-10-CM | POA: Diagnosis not present

## 2020-09-24 DIAGNOSIS — Z8719 Personal history of other diseases of the digestive system: Secondary | ICD-10-CM | POA: Diagnosis not present

## 2020-09-24 MED ORDER — FLUCONAZOLE 150 MG PO TABS: 150.0000 mg | ORAL_TABLET | Freq: Every day | ORAL | 0 refills | Status: DC

## 2020-09-24 NOTE — Progress Notes (Signed)
Subjective:  Patient ID: Paula Moses, female    DOB: 07/06/1965  Age: 55 y.o. MRN: [id number]  CC: The primary encounter diagnosis was History of diverticulitis. Diagnoses of Abdominal cramps, Acute diverticulitis, Hospital discharge follow-up, and Primary hypertension were also pertinent to this visit.  HPI Paula Moses presents for hospital follow up  This visit occurred during the SARS-CoV-2 public health emergency.  Safety protocols were in place, including screening questions prior to the visit, additional usage of staff PPE, and extensive cleaning of exam room while observing appropriate contact time as indicated for disinfecting solutions.   Admitted to [facility] on jun 28 with LLQ pain , severe, secondary to diverticulitis with extensive phlegmonous changes and peritoneal thickening adjacent to a thickened inflamed sigmoid colon but no clear evidence of , no abscess or perforation by CT .   No surgical consult done. Abd pain and nausea resolved and she was discharged home on July 1 with augmentin x 7 more days,  soft diet   Also diagnosed with COVID 19 infection (unvaccinated) . Was not treated due to absence of symptoms   Pain has improved but not resolved.  "Something doesn't feel quite right."  For the last few days,  still feels less severe (from 10 at admission down to a 6) pain along the suprapubic area across her lower abdomen, described as feeling like recurrent spasm s that are temprarily relieved by stooling.  Having up to 4 semi formed soft  stools daily .  She denies nausea and fevers,  Rectum has felt "raw"  due to the frequent stooling   Diet  is currently prudent, has limited salads to once.  Eating yogurt and taking a separate probiotic   Colonic history :  Chronic constipation. First documentation of of diverticultis was in July 2016 after having episodes of LLQ pain since Dec 2015 (per review of chart .. documented on July 2016 CT)   Last colonoscopy Nov 2020     Outpatient Medications Prior to Visit  Medication Sig Dispense Refill   Azelastine HCl 137 MCG/SPRAY SOLN Place 2 sprays into both nostrils 2 (two) times daily.     buPROPion (WELLBUTRIN XL) 150 MG 24 hr tablet Take 1 tablet (150 mg total) by mouth daily. 30 tablet 5   fluticasone (FLONASE) 50 MCG/ACT nasal spray SPRAY TWICE IN EACH NOSTRIL ONCE DAILY     furosemide (LASIX) 20 MG tablet Take 1 tablet (20 mg total) by mouth daily as needed. For fluid retention 30 tablet 3   meloxicam (MOBIC) 7.5 MG tablet Take 1 tablet by mouth once daily 30 tablet 0   montelukast (SINGULAIR) 10 MG tablet Take 10 mg by mouth at bedtime.     omeprazole (PRILOSEC OTC) 20 MG tablet Take 20 mg by mouth daily as needed (acid reflux).     polyethylene glycol (MIRALAX / GLYCOLAX) 17 g packet Take 17 g by mouth daily as needed for mild constipation.     PRESCRIPTION MEDICATION Testosterone and estrogen pellet implants in buttocks     progesterone (PROMETRIUM) 100 MG capsule Take 100-200 mg by mouth at bedtime.     rizatriptan (MAXALT) 5 MG tablet Take 5 mg by mouth every 2 (two) hours as needed for migraine.  0   valsartan-hydrochlorothiazide (DIOVAN-HCT) 160-12.5 MG tablet Take 1 tablet by mouth once daily 90 tablet 0   dibucaine (NUPERCAINAL) 1 % OINT Place 1 application rectally as needed for hemorrhoids. (Patient not taking: Reported on 09/24/2020) 28 g  1   dicyclomine (BENTYL) 10 MG capsule Take 1 capsule (10 mg total) by mouth 4 (four) times daily as needed for up to 7 days for spasms (Abdominal cramps). 28 capsule 0   Facility-Administered Medications Prior to Visit  Medication Dose Route Frequency Provider Last Rate Last Admin   hyoscyamine (ANASPAZ) disintergrating tablet 0.125 mg  0.125 mg Sublingual Q6H PRN Marval Regal, NP        Review of Systems;  Patient denies headache, fevers, malaise, unintentional weight loss, skin rash, eye pain, sinus congestion and sinus pain, sore throat, dysphagia,   hemoptysis , cough, dyspnea, wheezing, chest pain, palpitations, orthopnea, edema, abdominal pain, nausea, melena, diarrhea, constipation, flank pain, dysuria, hematuria, urinary  Frequency, nocturia, numbness, tingling, seizures,  Focal weakness, Loss of consciousness,  Tremor, insomnia, depression, anxiety, and suicidal ideation.      Objective:  BP 122/68 (BP Location: Left Arm, Patient Position: Sitting, Cuff Size: Large)   Pulse 64   Temp 98.1 F (36.7 C) (Oral)   Ht 5' 3.5" (1.613 m)   Wt 183 lb 12.8 oz (83.4 kg)   SpO2 98%   BMI 32.05 kg/m   BP Readings from Last 3 Encounters:  09/24/20 122/68  09/11/20 (!) 162/87  06/24/20 108/78    Wt Readings from Last 3 Encounters:  09/24/20 183 lb 12.8 oz (83.4 kg)  06/24/20 192 lb 3.2 oz (87.2 kg)  06/17/20 196 lb 6.4 oz (89.1 kg)    General appearance: alert, cooperative and appears stated age Ears: normal TM's and external ear canals both ears Throat: lips, mucosa, and tongue normal; teeth and gums normal Neck: no adenopathy, no carotid bruit, supple, symmetrical, trachea midline and thyroid not enlarged, symmetric, no tenderness/mass/nodules Back: symmetric, no curvature. ROM normal. No CVA tenderness. Lungs: clear to auscultation bilaterally Heart: regular rate and rhythm, S1, S2 normal, no murmur, click, rub or gallop Abdomen: soft, tender to deep palpation in LLQ withoyt rebound or guarding.  bowel sounds hypoactive; no masses,  no organomegaly Pulses: 2+ and symmetric Skin: Skin color, texture, turgor normal. No rashes or lesions Lymph nodes: Cervical, supraclavicular, and axillary nodes normal.  Lab Results  Component Value Date   HGBA1C 6.0 06/24/2020    Lab Results  Component Value Date   CREATININE 0.98 09/24/2020   CREATININE 0.98 09/11/2020   CREATININE 0.88 09/10/2020    Lab Results  Component Value Date   WBC 7.1 09/24/2020   HGB 14.2 09/24/2020   HCT 41.7 09/24/2020   PLT 276.0 09/24/2020   GLUCOSE  109 (H) 09/24/2020   CHOL 218 (H) 06/24/2020   TRIG 225.0 (H) 06/24/2020   HDL 37.50 (L) 06/24/2020   LDLDIRECT 157.0 06/24/2020   ALT 17 09/24/2020   AST 18 09/24/2020   NA 135 09/24/2020   K 4.2 09/24/2020   CL 101 09/24/2020   CREATININE 0.98 09/24/2020   BUN 13 09/24/2020   CO2 24 09/24/2020   TSH 3.57 06/17/2020   INR 0.96 10/25/2017   HGBA1C 6.0 06/24/2020   MICROALBUR <0.7 06/17/2020    CT ABDOMEN PELVIS W CONTRAST  Result Date: 09/08/2020 CLINICAL DATA:  Nonlocalized abdominal pain which began 2 days prior, history of diverticulitis EXAM: CT ABDOMEN AND PELVIS WITH CONTRAST TECHNIQUE: Multidetector CT imaging of the abdomen and pelvis was performed using the standard protocol following bolus administration of intravenous contrast. CONTRAST:  154mL OMNIPAQUE IOHEXOL 300 MG/ML  SOLN COMPARISON:  CT 03/18/2019 FINDINGS: Lower chest: Bilateral breast prostheses without acute CT evident  complication. Lung bases are clear. Normal heart size. No pericardial effusion. Hepatobiliary: No worrisome focal liver lesions. Smooth liver surface contour. Normal hepatic attenuation. Prior cholecystectomy. No significant biliary ductal dilatation accounting for post cholecystectomy reservoir effect. No visible intraductal gallstones. Pancreas: No pancreatic ductal dilatation or surrounding inflammatory changes. Spleen: Normal in size. No concerning splenic lesions. Adrenals/Urinary Tract: Normal adrenals. Kidneys enhance and excrete symmetrically. Stable region of cortical scarring in the upper pole left kidney. No worrisome renal mass. No urolithiasis or hydronephrosis. Portion of the pelvis obscured by streak artifact. Slightly asymmetric bladder wall thickening adjacent a region of left pelvic inflammation is favored to be reactive. No visible bladder calculi or debris. Stomach/Bowel: Distal esophagus, stomach and duodenum are unremarkable. No small bowel thickening or dilatation. Noninflamed appendix  in the right lower quadrant. Proximal colon is unremarkable. There is however more segmental thickening of the proximal to mid sigmoid with extensive surrounding phlegmon and inflammatory changes much of which appears centered on upon a culprit diverticulum abutting the left pelvic sidewall (5/41). Adjacent reactive free fluid is noted. No clear extraluminal gas is present. No organized abscess or collection. Inflammatory changes extend across the left pelvic sidewall with peritoneal thickening and enhancement. Vascular/Lymphatic: Increased vascularity with adjacent reactive appearing lymph nodes in the left lower quadrant in the vicinity of the inflamed sigmoid colon. No other suspicious or pathologically enlarged lymph nodes are seen in the abdomen or pelvis. Atherosclerotic plaque in the abdominal aorta and branch vessels without aneurysm, ectasia or other acute vascular abnormality. Reproductive: Post hysterectomy. Inflammatory changes in the left adnexa related to the colon as above. Other: Extensive phlegmonous changes and peritoneal thickening adjacent the thickened, inflamed sigmoid colon. Some distribution of the low-attenuation free fluid throughout the abdomen and pelvis. No evidence of organized abscess or collection. No free air. No bowel containing hernia. Portion of the pelvis obscured by streak artifact from bilateral hip prostheses. Musculoskeletal: Bilateral total hip arthroplasties without acute hardware failure, periprosthetic fracture or other complication. Resulting streak artifact. 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 Minimal retrolisthesis L4 on L5 without additional spondylolysis. Multilevel degenerative changes are present in the imaged portions of the spine. IMPRESSION: 1. Circumferentially thickened sigmoid colon centered in a region of numerous colonic diverticula with a larger inflamed culprit diverticulum along the left pelvic sidewall compatible with an  acute diverticulitis. Extensive surrounding phlegmon and features suggestive of adjacent peritonitis albeit without extraluminal gas, organized collection or abscess at this time though please note portions of the pelvis are poorly visualized due to streak artifact from bilateral hip prostheses. This is in a similar location to prior diverticulitis seen on comparison imaging 03/18/2019. 2. Bilateral hip arthroplasty without acute complication. 3. Chronic left renal scarring. 4. Prior cholecystectomy. 5.  Aortic Atherosclerosis (ICD10-I70.0). Electronically Signed   By: Lovena Le M.D.   On: 09/08/2020 20:17    Assessment & Plan:   Problem List Items Addressed This Visit       Unprioritized   Abdominal cramps    Trial of antispasmodic starting at 10 mg qac of dicyclomine       Acute diverticulitis    Clinically improving but her lower abdominal  pain has not resolved.  Inflammatory markers and CBC ae normal .  She has extensive  Phlegmon ; given her recurrent episodes will refer to general surgery for evaluation and consideration of partial colectomy to reduce recurrence.   Lab Results  Component Value Date   ESRSEDRATE 17 09/24/2020  Lab Results  Component Value Date   CRP <1.0 09/24/2020   Lab Results  Component Value Date   WBC 7.1 09/24/2020   HGB 14.2 09/24/2020   HCT 41.7 09/24/2020   MCV 88.8 09/24/2020   PLT 276.0 09/24/2020         Hospital discharge follow-up   Hypertension    Patient is stable post discharge and has no new issues or questions about discharge plans at the visit today for hospital follow up. All labs , imaging studies and progress notes from admission were reviewed with patient today         Other Visit Diagnoses     History of diverticulitis    -  Primary   Relevant Orders   Ambulatory referral to General Surgery   Comprehensive metabolic panel (Completed)   CBC with Differential/Platelet (Completed)   Sedimentation rate (Completed)    C-reactive protein (Completed)       I am having Carlisle L. Sunde start on fluconazole. I am also having her maintain her montelukast, omeprazole, rizatriptan, Azelastine HCl, fluticasone, dibucaine, valsartan-hydrochlorothiazide, progesterone, furosemide, buPROPion, polyethylene glycol, PRESCRIPTION MEDICATION, dicyclomine, and meloxicam. We will continue to administer hyoscyamine.  Meds ordered this encounter  Medications   fluconazole (DIFLUCAN) 150 MG tablet    Sig: Take 1 tablet (150 mg total) by mouth daily.    Dispense:  2 tablet    Refill:  0    There are no discontinued medications.  Follow-up: No follow-ups on file.   [name], MD

## 2020-09-24 NOTE — Patient Instructions (Addendum)
Stay away from salads,  fresh veggies until pain improves  You can retry the dicylcomine  which is an antispasmodic for the bowels.  The maximum dose is 20 mg  for times daily   Referral to Dr Bary Castilla is in process to determine if a partial colectomy is recommended to prevent recurrence

## 2020-09-25 LAB — COMPREHENSIVE METABOLIC PANEL
ALT: 17 U/L (ref 0–35)
AST: 18 U/L (ref 0–37)
Albumin: 4.5 g/dL (ref 3.5–5.2)
Alkaline Phosphatase: 51 U/L (ref 39–117)
BUN: 13 mg/dL (ref 6–23)
CO2: 24 mEq/L (ref 19–32)
Calcium: 9.5 mg/dL (ref 8.4–10.5)
Chloride: 101 mEq/L (ref 96–112)
Creatinine, Ser: 0.98 mg/dL (ref 0.40–1.20)
GFR: 66.26 mL/min (ref >60.00)
Glucose, Bld: 109 mg/dL — ABNORMAL HIGH (ref 70–99)
Potassium: 4.2 mEq/L (ref 3.5–5.1)
Sodium: 135 mEq/L (ref 135–145)
Total Bilirubin: 0.5 mg/dL (ref 0.2–1.2)
Total Protein: 7.5 g/dL (ref 6.0–8.3)

## 2020-09-25 LAB — CBC WITH DIFFERENTIAL/PLATELET
Basophils Absolute: 0.1 10*3/uL (ref 0.0–0.1)
Basophils Relative: 1.1 % (ref 0.0–3.0)
Eosinophils Absolute: 0.2 10*3/uL (ref 0.0–0.7)
Eosinophils Relative: 2.6 % (ref 0.0–5.0)
HCT: 41.7 % (ref 36.0–46.0)
Hemoglobin: 14.2 g/dL (ref 12.0–15.0)
Lymphocytes Relative: 39.6 % (ref 12.0–46.0)
Lymphs Abs: 2.8 10*3/uL (ref 0.7–4.0)
MCHC: 33.9 g/dL (ref 30.0–36.0)
MCV: 88.8 fl (ref 78.0–100.0)
Monocytes Absolute: 0.5 10*3/uL (ref 0.1–1.0)
Monocytes Relative: 6.4 % (ref 3.0–12.0)
Neutro Abs: 3.6 10*3/uL (ref 1.4–7.7)
Neutrophils Relative %: 50.3 % (ref 43.0–77.0)
Platelets: 276 10*3/uL (ref 150.0–400.0)
RBC: 4.7 Mil/uL (ref 3.87–5.11)
RDW: 14 % (ref 11.5–15.5)
WBC: 7.1 10*3/uL (ref 4.0–10.5)

## 2020-09-25 LAB — SEDIMENTATION RATE: Sed Rate: 17 mm/hr (ref 0–30)

## 2020-09-25 LAB — C-REACTIVE PROTEIN: CRP: <1 mg/dL (ref 0.5–20.0)

## 2020-09-26 DIAGNOSIS — Z09 Encounter for follow-up examination after completed treatment for conditions other than malignant neoplasm: Secondary | ICD-10-CM | POA: Insufficient documentation

## 2020-09-26 NOTE — Assessment & Plan Note (Signed)
Patient is stable post discharge and has no new issues or questions about discharge plans at the visit today for hospital follow up. All labs , imaging studies and progress notes from admission were reviewed with patient today   

## 2020-09-26 NOTE — Assessment & Plan Note (Addendum)
Clinically improving but her lower abdominal  pain has not resolved.  Inflammatory markers and CBC ae normal .  She has extensive  Phlegmon ; given her recurrent episodes will refer to general surgery for evaluation and consideration of partial colectomy to reduce recurrence.   Lab Results  Component Value Date   ESRSEDRATE 17 09/24/2020   Lab Results  Component Value Date   CRP <1.0 09/24/2020   Lab Results  Component Value Date   WBC 7.1 09/24/2020   HGB 14.2 09/24/2020   HCT 41.7 09/24/2020   MCV 88.8 09/24/2020   PLT 276.0 09/24/2020

## 2020-09-26 NOTE — Assessment & Plan Note (Signed)
Trial of antispasmodic starting at 10 mg qac of dicyclomine

## 2020-10-13 ENCOUNTER — Other Ambulatory Visit: Payer: Self-pay | Admitting: Surgery

## 2020-10-13 ENCOUNTER — Telehealth: Payer: Self-pay

## 2020-10-13 ENCOUNTER — Ambulatory Visit
Admission: RE | Admit: 2020-10-13 | Discharge: 2020-10-13 | Disposition: A | Discharge status: Home / Self Care | Payer: 59 | Source: Ambulatory Visit | Attending: Surgery | Admitting: Surgery

## 2020-10-13 ENCOUNTER — Other Ambulatory Visit: Payer: Self-pay

## 2020-10-13 DIAGNOSIS — R1032 Left lower quadrant pain: Secondary | ICD-10-CM | POA: Insufficient documentation

## 2020-10-13 MED ORDER — IOHEXOL 350 MG/ML SOLN
75.0000 mL | Freq: Once | INTRAVENOUS | Status: AC | PRN
  Administered 2020-10-13: 75 mL via INTRAVENOUS

## 2020-10-13 NOTE — Telephone Encounter (Signed)
LMTCB

## 2020-10-13 NOTE — Telephone Encounter (Signed)
Given her persistent symptoms and also some change in symptoms, she needs to be evaluated to be able to determine the etiology of her pain and how best to treat.

## 2020-10-13 NOTE — Telephone Encounter (Signed)
error 

## 2020-11-10 ENCOUNTER — Other Ambulatory Visit: Payer: Self-pay | Admitting: Internal Medicine

## 2020-12-03 ENCOUNTER — Ambulatory Visit (INDEPENDENT_AMBULATORY_CARE_PROVIDER_SITE_OTHER): Payer: 59 | Admitting: Internal Medicine

## 2020-12-03 ENCOUNTER — Other Ambulatory Visit: Payer: Self-pay

## 2020-12-03 ENCOUNTER — Encounter: Payer: Self-pay | Admitting: Internal Medicine

## 2020-12-03 DIAGNOSIS — M5136 Other intervertebral disc degeneration, lumbar region: Secondary | ICD-10-CM | POA: Diagnosis not present

## 2020-12-03 DIAGNOSIS — I10 Essential (primary) hypertension: Secondary | ICD-10-CM | POA: Diagnosis not present

## 2020-12-03 DIAGNOSIS — E7800 Pure hypercholesterolemia, unspecified: Secondary | ICD-10-CM

## 2020-12-03 DIAGNOSIS — K5904 Chronic idiopathic constipation: Secondary | ICD-10-CM

## 2020-12-03 DIAGNOSIS — E6609 Other obesity due to excess calories: Secondary | ICD-10-CM

## 2020-12-03 DIAGNOSIS — Z6833 Body mass index (BMI) 33.0-33.9, adult: Secondary | ICD-10-CM

## 2020-12-03 DIAGNOSIS — K5730 Diverticulosis of large intestine without perforation or abscess without bleeding: Secondary | ICD-10-CM

## 2020-12-03 DIAGNOSIS — K219 Gastro-esophageal reflux disease without esophagitis: Secondary | ICD-10-CM

## 2020-12-03 DIAGNOSIS — R41840 Attention and concentration deficit: Secondary | ICD-10-CM

## 2020-12-03 MED ORDER — HYDROCHLOROTHIAZIDE 25 MG PO TABS: 25.0000 mg | ORAL_TABLET | Freq: Every day | ORAL | 3 refills | Status: DC

## 2020-12-03 MED ORDER — VALSARTAN 80 MG PO TABS: 80.0000 mg | ORAL_TABLET | Freq: Every day | ORAL | 3 refills | Status: DC

## 2020-12-03 MED ORDER — MOUNJARO 2.5 MG/0.5ML ~~LOC~~ SOAJ: 2.5000 mg | SUBCUTANEOUS | 2 refills | Status: DC

## 2020-12-03 MED ORDER — BUPROPION HCL ER (XL) 300 MG PO TB24: 300.0000 mg | ORAL_TABLET | Freq: Every day | ORAL | 1 refills | Status: DC

## 2020-12-03 NOTE — Patient Instructions (Addendum)
I agree you are doing all the right things with the dietary changes  Consider an alternative bulk forming laxative (Citrucel, Metamucil, Benefiber,  Fibercon)    I appreciate your concern about continuing a  PPI in light of the recently published studies suggesting an association with increased risk of dementia and kidney failure.  I advise you to try switching  From your PPI to famotidine 20 mg  twice daily  H2 blockers and are available without a prescriptions.   if your reflux symptoms are controlled,  You can Continue the daily h2 blocker.    Restart wellbutrin at 150 mg and increase to 300 mg after 2 weeks    Hold off on starting mounjaro to see if the wellbutrin helps you lose weight  [name] is a medication that is taken as a weekly IM  injection. It is not insulin.  It  causes your pancreas to increase its  own insulin secretion  And also slows down the emptying of your stomach,  So it decreases your appetite and helps you lose weight.  The dose for the first 4 weekly doses is 2.5 mg .  You may have mild nausea on the first or second day but this should resolve.  If not  ,  stop the medication.   As long as you are losing weight,  you can continue the dose you are on .  Only increase the dose to  5.0 mg  after 4 weeks if your weight has plateaued.  Let me know when you need a refill and what dose you are taking.

## 2020-12-03 NOTE — Progress Notes (Signed)
Subjective:  Patient ID: Paula Moses, female    DOB: 1965-07-17  Age: 55 y.o. MRN: [id number]  CC: Diagnoses of Degenerative disc disease, lumbar, Chronic idiopathic constipation, Primary hypertension, Concentration deficit, Diverticulosis large intestine w/o perforation or abscess w/o bleeding, High cholesterol, Class 1 obesity due to excess calories without serious comorbidity with body mass index (BMI) of 33.0 to 33.9 in adult, and GERD without esophagitis were pertinent to this visit.  HPI MANASI DISHON presents for follow up on multiple issues Chief Complaint  Patient presents with   Follow-up    Pt would like to discuss her wellbutrin prescription. Pt was seen by ENT yesterday and prescribed omeprazole to take once daily for her hoarseness.     GERD:  per ENT evaluation yesterday for hoarseness., she has been prescribed PPI for "silent reflux"  Concentration deficit :  stopped Wellbutrin  after several weeks of starting dose  due to lack of perceived effect. Referral for testing was authorized April 21 to [place] [facility]  Recurrent sigmoid diverticulitis:  seeing surgery who has recommended a lap hemicolectomy to resect the troublesome area  due to recurrent disease.  Last CT in mid August noted resolution of previously seen inflammation. On a "gut health diet" for the past 2 weeks which is making a significant difference in her abdominal symptoms and her weight.  She hast lost 14 lbs .  Still working out  , feeling better than she anticipated but missing certain food. Bowel regimen has improved finally  HTN: worried about BP dropping with wegovy  change valsartan to 80 mg and hctz 25 mg daily   Menopause:  her testosterone dose has been increased by her specialist.  She has more energy, sleeping better.    Outpatient Medications Prior to Visit  Medication Sig Dispense Refill   Azelastine HCl 137 MCG/SPRAY SOLN Place 2 sprays into both nostrils 2 (two) times  daily.     dibucaine (NUPERCAINAL) 1 % OINT Place 1 application rectally as needed for hemorrhoids. 28 g 1   dicyclomine (BENTYL) 10 MG capsule Take 1 capsule (10 mg total) by mouth 4 (four) times daily as needed for up to 7 days for spasms (Abdominal cramps). 28 capsule 0   fluticasone (FLONASE) 50 MCG/ACT nasal spray SPRAY TWICE IN EACH NOSTRIL ONCE DAILY     furosemide (LASIX) 20 MG tablet Take 1 tablet (20 mg total) by mouth daily as needed. For fluid retention 30 tablet 3   meloxicam (MOBIC) 7.5 MG tablet Take 1 tablet by mouth once daily 30 tablet 0   montelukast (SINGULAIR) 10 MG tablet Take 10 mg by mouth at bedtime.     omeprazole (PRILOSEC OTC) 20 MG tablet Take 20 mg by mouth daily.     PRESCRIPTION MEDICATION Testosterone and estrogen pellet implants in buttocks     progesterone (PROMETRIUM) 100 MG capsule Take 100-200 mg by mouth at bedtime.     valsartan-hydrochlorothiazide (DIOVAN-HCT) 160-12.5 MG tablet Take 1 tablet by mouth once daily 90 tablet 0   rizatriptan (MAXALT) 5 MG tablet Take 5 mg by mouth every 2 (two) hours as needed for migraine. (Patient not taking: Reported on 12/03/2020)  0   buPROPion (WELLBUTRIN XL) 150 MG 24 hr tablet Take 1 tablet (150 mg total) by mouth daily. (Patient not taking: Reported on 12/03/2020) 30 tablet 5   fluconazole (DIFLUCAN) 150 MG tablet Take 1 tablet (150 mg total) by mouth daily. (Patient not taking: Reported on 12/03/2020) 2 tablet  0   polyethylene glycol (MIRALAX / GLYCOLAX) 17 g packet Take 17 g by mouth daily as needed for mild constipation. (Patient not taking: Reported on 12/03/2020)     Facility-Administered Medications Prior to Visit  Medication Dose Route Frequency Provider Last Rate Last Admin   hyoscyamine (ANASPAZ) disintergrating tablet 0.125 mg  0.125 mg Sublingual Q6H PRN Marval Regal, NP        Review of Systems;  Patient denies headache, fevers, malaise, unintentional weight loss, skin rash, eye pain, sinus congestion  and sinus pain, sore throat, dysphagia,  hemoptysis , cough, dyspnea, wheezing, chest pain, palpitations, orthopnea, edema, abdominal pain, nausea, melena, diarrhea, constipation, flank pain, dysuria, hematuria, urinary  Frequency, nocturia, numbness, tingling, seizures,  Focal weakness, Loss of consciousness,  Tremor, insomnia, depression, anxiety, and suicidal ideation.      Objective:  BP 110/64 (BP Location: Left Arm, Patient Position: Sitting, Cuff Size: Large)   Pulse 66   Temp (!) 96.4 F (35.8 C) (Temporal)   Ht 5' 3.5" (1.613 m)   Wt 182 lb 9.6 oz (82.8 kg)   SpO2 96%   BMI 31.84 kg/m   BP Readings from Last 3 Encounters:  12/03/20 110/64  09/24/20 122/68  09/11/20 (!) 162/87    Wt Readings from Last 3 Encounters:  12/03/20 182 lb 9.6 oz (82.8 kg)  09/24/20 183 lb 12.8 oz (83.4 kg)  06/24/20 192 lb 3.2 oz (87.2 kg)    General appearance: alert, cooperative and appears stated age Ears: normal TM's and external ear canals both ears Throat: lips, mucosa, and tongue normal; teeth and gums normal Neck: no adenopathy, no carotid bruit, supple, symmetrical, trachea midline and thyroid not enlarged, symmetric, no tenderness/mass/nodules Back: symmetric, no curvature. ROM normal. No CVA tenderness. Lungs: clear to auscultation bilaterally Heart: regular rate and rhythm, S1, S2 normal, no murmur, click, rub or gallop Abdomen: soft, non-tender; bowel sounds normal; no masses,  no organomegaly Pulses: 2+ and symmetric Skin: Skin color, texture, turgor normal. No rashes or lesions Lymph nodes: Cervical, supraclavicular, and axillary nodes normal.  Lab Results  Component Value Date   HGBA1C 6.0 06/24/2020    Lab Results  Component Value Date   CREATININE 0.98 09/24/2020   CREATININE 0.98 09/11/2020   CREATININE 0.88 09/10/2020    Lab Results  Component Value Date   WBC 7.1 09/24/2020   HGB 14.2 09/24/2020   HCT 41.7 09/24/2020   PLT 276.0 09/24/2020   GLUCOSE 109  (H) 09/24/2020   CHOL 218 (H) 06/24/2020   TRIG 225.0 (H) 06/24/2020   HDL 37.50 (L) 06/24/2020   LDLDIRECT 157.0 06/24/2020   ALT 17 09/24/2020   AST 18 09/24/2020   NA 135 09/24/2020   K 4.2 09/24/2020   CL 101 09/24/2020   CREATININE 0.98 09/24/2020   BUN 13 09/24/2020   CO2 24 09/24/2020   TSH 3.57 06/17/2020   INR 0.96 10/25/2017   HGBA1C 6.0 06/24/2020   MICROALBUR <0.7 06/17/2020    CT ABDOMEN PELVIS W CONTRAST  Result Date: 10/13/2020 CLINICAL DATA:  Left lower quadrant pain for 1 week. EXAM: CT ABDOMEN AND PELVIS WITH CONTRAST TECHNIQUE: Multidetector CT imaging of the abdomen and pelvis was performed using the standard protocol following bolus administration of intravenous contrast. CONTRAST:  51mL OMNIPAQUE IOHEXOL 350 MG/ML SOLN COMPARISON:  08/31/2020 FINDINGS: Lower Chest: No acute findings. Hepatobiliary: No hepatic masses identified. Prior cholecystectomy. No evidence of biliary obstruction. Pancreas:  No mass or inflammatory changes. Spleen: Within normal limits  in size and appearance. Adrenals/Urinary Tract: No masses identified. No evidence of ureteral calculi or hydronephrosis. Stomach/Bowel: Sigmoid diverticulosis is again seen. Minimal pericolonic soft tissue stranding is nearly completely resolved since previous study, consistent with resolving diverticulitis. No other inflammatory process or abnormal fluid collections identified. Vascular/Lymphatic: No pathologically enlarged lymph nodes. No acute vascular findings. Reproductive: Limited visualization of inferior pelvis due to severe artifact from bilateral hip prostheses. Prior hysterectomy noted. Adnexal regions are unremarkable in appearance. Other:  None. Musculoskeletal: No suspicious bone lesions identified. Bilateral L5 pars defects again noted with grade 1 anterolisthesis at L5-S1. Severe degenerative disc disease again seen at L4-5 and L5-S1. IMPRESSION: Near complete resolution of sigmoid diverticulitis since  prior exam. No acute findings. No evidence of abscess or other complication. Electronically Signed   By: Marlaine Hind M.D.   On: 10/13/2020 15:22    Assessment & Plan:   Problem List Items Addressed This Visit       Unprioritized   Concentration deficit    Advised to resume wellbutrin and increase dose after several weeks to 300 mg daily Referring to [facility]      Constipation    Improved with change in diet and BFL       Degenerative disc disease, lumbar   Diverticulosis large intestine w/o perforation or abscess w/o bleeding    She is trying to avoid surgery with improved dietary measures. And thus far has had no recurrence of diverticulitis        GERD without esophagitis    Diagnosed by ENT with sings of laryngeal irritation . PPI started. Discussed current controversy regarding prolonged use of PPI in patients without documented Barretts esophagus. .  Suggested converting to famotidine  20 mg daily once her hoarseness resolves..  If GERD symptoms return,  advised her to accept referral for EGD.        High cholesterol    And concurrent aortic atherosclerosis :  Discussed need for statin therapy given documented evidence of iliac  atherosclerosis  noted on recent  CT of abdomen and  pelvis and the prognostic implications of this finding.         Relevant Medications   valsartan (DIOVAN) 80 MG tablet   hydrochlorothiazide (HYDRODIURIL) 25 MG tablet   Hypertension    Improved with weight loss.  Lowering dose of valsartan to  80 mg daily and increase dose of hctz to  25 mg daily Renal function stable, no changes today.  Lab Results  Component Value Date   CREATININE 0.98 09/24/2020   Lab Results  Component Value Date   NA 135 09/24/2020   K 4.2 09/24/2020   CL 101 09/24/2020   CO2 24 09/24/2020         Relevant Medications   valsartan (DIOVAN) 80 MG tablet   hydrochlorothiazide (HYDRODIURIL) 25 MG tablet   Obesity    I have addressed  BMI  and recommended a Mediterranean style low glycemic index diet.   I have also recommended that patient continue  exercising with a goal of 30 minutes of aerobic exercise a minimum of 5 days per week.  Trial of mounjaro discussed if weight loss plateaues      Relevant Medications   tirzepatide (MOUNJARO) 2.5 MG/0.5ML Pen     Medications Discontinued During This Encounter  Medication Reason   fluconazole (DIFLUCAN) 150 MG tablet    polyethylene glycol (MIRALAX / GLYCOLAX) 17 g packet    buPROPion (WELLBUTRIN XL) 150 MG 24 hr  tablet    valsartan-hydrochlorothiazide (DIOVAN-HCT) 160-12.5 MG tablet     Follow-up: No follow-ups on file.   [name], MD

## 2020-12-05 DIAGNOSIS — K219 Gastro-esophageal reflux disease without esophagitis: Secondary | ICD-10-CM | POA: Insufficient documentation

## 2020-12-05 DIAGNOSIS — K5730 Diverticulosis of large intestine without perforation or abscess without bleeding: Secondary | ICD-10-CM | POA: Insufficient documentation

## 2020-12-05 NOTE — Assessment & Plan Note (Addendum)
Improved with weight loss.  Lowering dose of valsartan to  80 mg daily and increase dose of hctz to  25 mg daily Renal function stable, no changes today.  Lab Results  Component Value Date   CREATININE 0.98 09/24/2020   Lab Results  Component Value Date   NA 135 09/24/2020   K 4.2 09/24/2020   CL 101 09/24/2020   CO2 24 09/24/2020

## 2020-12-05 NOTE — Assessment & Plan Note (Signed)
And concurrent aortic atherosclerosis :  Discussed need for statin therapy given documented evidence of iliac  atherosclerosis  noted on recent  CT of abdomen and  pelvis and the prognostic implications of this finding.

## 2020-12-05 NOTE — Assessment & Plan Note (Addendum)
Diagnosed by ENT with sings of laryngeal irritation . PPI started. Discussed current controversy regarding prolonged use of PPI in patients without documented Barretts esophagus. .  Suggested converting to famotidine  20 mg daily once her hoarseness resolves..  If GERD symptoms return,  advised her to accept referral for EGD.

## 2020-12-05 NOTE — Assessment & Plan Note (Addendum)
Improved with change in diet and BFL

## 2020-12-05 NOTE — Assessment & Plan Note (Signed)
She is trying to avoid surgery with improved dietary measures. And thus far has had no recurrence of diverticulitis

## 2020-12-05 NOTE — Assessment & Plan Note (Addendum)
I have addressed  BMI and recommended a Mediterranean style low glycemic index diet.   I have also recommended that patient continue  exercising with a goal of 30 minutes of aerobic exercise a minimum of 5 days per week.  Trial of mounjaro discussed if weight loss plateaues

## 2020-12-05 NOTE — Assessment & Plan Note (Addendum)
Advised to resume wellbutrin and increase dose after several weeks to 300 mg daily Referring to [facility]

## 2020-12-14 ENCOUNTER — Other Ambulatory Visit: Payer: Self-pay | Admitting: Internal Medicine

## 2020-12-17 NOTE — Telephone Encounter (Signed)
Spoke with pt to let her know that she will need to bypass her insurance and use the discount card in order to get the medication for $25. Pt gave a verbal understanding.

## 2021-01-03 ENCOUNTER — Other Ambulatory Visit: Payer: Self-pay | Admitting: Internal Medicine

## 2021-01-11 MED ORDER — TIRZEPATIDE 5 MG/0.5ML ~~LOC~~ SOAJ: 5.0000 mg | SUBCUTANEOUS | 2 refills | Status: DC

## 2021-01-11 NOTE — Telephone Encounter (Signed)
Patient was on the tirzepatide [facility]) 2.5  Okay to increase dose?

## 2021-02-15 ENCOUNTER — Other Ambulatory Visit: Payer: Self-pay | Admitting: Internal Medicine

## 2021-02-22 ENCOUNTER — Encounter: Payer: Self-pay | Admitting: Internal Medicine

## 2021-02-24 MED ORDER — TIRZEPATIDE 7.5 MG/0.5ML ~~LOC~~ SOAJ: 7.5000 mg | SUBCUTANEOUS | 0 refills | Status: DC

## 2021-03-16 ENCOUNTER — Encounter: Payer: Self-pay | Admitting: Internal Medicine

## 2021-03-16 ENCOUNTER — Telehealth (INDEPENDENT_AMBULATORY_CARE_PROVIDER_SITE_OTHER): Payer: 59 | Admitting: Internal Medicine

## 2021-03-16 VITALS — Ht 63.5 in | Wt 163.0 lb

## 2021-03-16 DIAGNOSIS — K5730 Diverticulosis of large intestine without perforation or abscess without bleeding: Secondary | ICD-10-CM

## 2021-03-16 DIAGNOSIS — E6609 Other obesity due to excess calories: Secondary | ICD-10-CM

## 2021-03-16 DIAGNOSIS — N941 Unspecified dyspareunia: Secondary | ICD-10-CM

## 2021-03-16 DIAGNOSIS — Z1231 Encounter for screening mammogram for malignant neoplasm of breast: Secondary | ICD-10-CM | POA: Insufficient documentation

## 2021-03-16 DIAGNOSIS — Z6833 Body mass index (BMI) 33.0-33.9, adult: Secondary | ICD-10-CM

## 2021-03-16 NOTE — Assessment & Plan Note (Addendum)
Significant reduction in  BMI with pharmacotherapy Paula Moses) and regular participation in  exercise with 20 lb wt loss since initiation of therapy in June .  BMI is currently 28. Her goal is 150-155 lbs.  Currently at 7.5 mg weekly dose

## 2021-03-16 NOTE — Progress Notes (Addendum)
Virtual Visit   This visit type was conducted due to national recommendations for restrictions regarding the COVID-19 pandemic (e.g. social distancing).  This format is felt to be most appropriate for this patient at this time.  All issues noted in this document were discussed and addressed.  No physical exam was performed (except for noted visual exam findings with Video Visits).   I connected with Paula Moses  on 03/16/21 at  1:00 PM EST by a video enabled telemedicine application and verified that I am speaking with the correct person using two identifiers. Location patient: home Location provider: work or home office Persons participating in the virtual visit: patient, provider  I discussed the limitations, risks, security and privacy concerns of performing an evaluation and management service by telephone and the availability of in person appointments. I also discussed with the patient that there may be a patient responsible charge related to this service. The patient expressed understanding and agreed to proceed.   Reason for visit: follow up on pharmacotherapy for weight loss, diverticulosis    HPI:  56 yr old presents for follow up on multiple issues:  1) Obesity:  she has been using mounjaro for weight management.  She has achieved a  24 lb wt loss thus far,  and currently on month 2 of 7.5 mg weekly dose. Her goal is 150 lb   Having nausea at night for several hours on the night of her dose.  Usually eats 1-2 times daily .   Exercising regularly.  Very pleased with pgrogress   2) Diverticulosis: had a flare of "itis" several weeks ago despite following the gut health program closely. She self treated with a round of antibiotics previously given to her by the general surgeon that she had never used.  She Is taking probiotics,  drinking Kombucha,  and has requested an appt with her surgeon . [name] has made her slightly constipated and limited her capacity for eating so she is struggling  with maintaining adequate hydration .  She is tolerating metamucil    3)  Dyspareunia .  No bleeding.  She is .S.p TAH/BSO.  Pain is Not positional. Discussed referral to GYN  vs pelvic floor PT  ROS: See pertinent positives and negatives per HPI.  Past Medical History:  Diagnosis Date   Abnormal Pap smear of cervix    Abnormal vaginal bleeding    Anxiety    Arthritis    neck and body   Complication of anesthesia    IT TAKES ALOT OF ANESTHESIA TO KEEP PT SEDATED   Cyst of vulva    Dyspareunia    External hemorrhoid    Fibroids    GERD (gastroesophageal reflux disease)    Headache 2019   migraines, becoming more frequent   High cholesterol    History of UTI    Hypertension    Left-sided face pain    Neck pain    Paresthesia    Rectocele    Surgical menopause    Trigeminal neuralgia    Uterus prolapse     Past Surgical History:  Procedure Laterality Date   AUGMENTATION MAMMAPLASTY Bilateral 2006   BREAST ENHANCEMENT SURGERY     CHOLECYSTECTOMY     COLONOSCOPY WITH PROPOFOL N/A 01/31/2019   Procedure: COLONOSCOPY WITH PROPOFOL;  Surgeon: [name], MD;  Location: [facility];  Service: Endoscopy;  Laterality: N/A;  2 day prep    HIP ARTHROSCOPY Right 05/05/2017   Procedure: ARTHROSCOPY HIP;  Surgeon: Posey Pronto,  Tarry Kos, MD;  Location: [facility] ORS;  Service: Orthopedics;  Laterality: Right;   LAPAROSCOPY     X2   TOTAL HIP ARTHROPLASTY Right 10/26/2017   Procedure: TOTAL HIP ARTHROPLASTY ANTERIOR APPROACH;  Surgeon: [name], MD;  Location: [facility] ORS;  Service: Orthopedics;  Laterality: Right;   TOTAL HIP ARTHROPLASTY Left 03/30/2018   Procedure: LEFT TOTAL HIP ARTHROPLASTY ANTERIOR APPROACH;  Surgeon: [name], MD;  Location: [facility];  Service: Orthopedics;  Laterality: Left;   tummy tuck      VAGINAL HYSTERECTOMY     lahv bso    Family History  Problem Relation Age of Onset   Brain cancer Mother    Dementia Father    Breast cancer Neg Hx      SOCIAL HX:  reports that she has never smoked. She has never used smokeless tobacco. She reports current alcohol use. She reports that she does not use drugs.    Current Outpatient Medications:    Azelastine HCl 137 MCG/SPRAY SOLN, Place 2 sprays into both nostrils 2 (two) times daily., Disp: , Rfl:    buPROPion (WELLBUTRIN XL) 300 MG 24 hr tablet, Take 1 tablet (300 mg total) by mouth daily., Disp: 90 tablet, Rfl: 1   dibucaine (NUPERCAINAL) 1 % OINT, Place 1 application rectally as needed for hemorrhoids., Disp: 28 g, Rfl: 1   fluticasone (FLONASE) 50 MCG/ACT nasal spray, SPRAY TWICE IN EACH NOSTRIL ONCE DAILY, Disp: , Rfl:    furosemide (LASIX) 20 MG tablet, Take 1 tablet (20 mg total) by mouth daily as needed. For fluid retention, Disp: 30 tablet, Rfl: 3   hydrochlorothiazide (HYDRODIURIL) 25 MG tablet, Take 1 tablet (25 mg total) by mouth daily., Disp: 90 tablet, Rfl: 3   meloxicam (MOBIC) 7.5 MG tablet, Take 1 tablet by mouth once daily, Disp: 30 tablet, Rfl: 0   montelukast (SINGULAIR) 10 MG tablet, Take 10 mg by mouth at bedtime., Disp: , Rfl:    omeprazole (PRILOSEC OTC) 20 MG tablet, Take 20 mg by mouth daily., Disp: , Rfl:    PRESCRIPTION MEDICATION, Testosterone and estrogen pellet implants in buttocks, Disp: , Rfl:    progesterone (PROMETRIUM) 100 MG capsule, Take 100-200 mg by mouth at bedtime., Disp: , Rfl:    rizatriptan (MAXALT) 5 MG tablet, Take 5 mg by mouth every 2 (two) hours as needed for migraine., Disp: , Rfl: 0   tirzepatide (MOUNJARO) 7.5 MG/0.5ML Pen, Inject 7.5 mg into the skin once a week., Disp: 6 mL, Rfl: 0   valsartan (DIOVAN) 80 MG tablet, Take 1 tablet (80 mg total) by mouth daily., Disp: 90 tablet, Rfl: 3   dicyclomine (BENTYL) 10 MG capsule, Take 1 capsule (10 mg total) by mouth 4 (four) times daily as needed for up to 7 days for spasms (Abdominal cramps)., Disp: 28 capsule, Rfl: 0  Current Facility-Administered Medications:    hyoscyamine (ANASPAZ)  disintergrating tablet 0.125 mg, 0.125 mg, Sublingual, Q6H PRN, Marval Regal, NP  EXAM:  VITALS per patient if applicable:  GENERAL: alert, oriented, appears well and in no acute distress  HEENT: atraumatic, conjunttiva clear, no obvious abnormalities on inspection of external nose and ears  NECK: normal movements of the head and neck  LUNGS: on inspection no signs of respiratory distress, breathing rate appears normal, no obvious gross SOB, gasping or wheezing  CV: no obvious cyanosis  Paula: moves all visible extremities without noticeable abnormality  PSYCH/NEURO: pleasant and cooperative, no obvious depression or anxiety, speech and thought  processing grossly intact  ASSESSMENT AND PLAN:  Discussed the following assessment and plan:  Breast cancer screening by mammogram - Plan: MM 3D SCREEN BREAST BILATERAL  Class 1 obesity due to excess calories without serious comorbidity with body mass index (BMI) of 33.0 to 33.9 in adult - Plan: Lipid panel, Comprehensive metabolic panel  Dyspareunia in female - Plan: Ambulatory referral to Gynecology  Diverticulosis large intestine w/o perforation or abscess w/o bleeding  Obesity Significant reduction in  BMI with pharmacotherapy (Mounjaro) and regular participation in  exercise with 20 lb wt loss since initiation of therapy in June .  BMI is currently 28. Her goal is 150-155 lbs.  Currently at 7.5 mg weekly dose   Dyspareunia in female Etiology unclear.  She is s/p TAH/BSO, is taking HRT , including testosterone and estrogen, and has had a CT abd pelvis in August which showed no masses.  Pelvic floor muscle dysfunction suspected.  GYN eval needed first.   Diverticulosis large intestine w/o perforation or abscess w/o bleeding She has been advised to avoid  surgery by a general surgeon and wants to avoid surgery because of the risk of diverting colectomy .  She has been following  A healthy gut diet.    Her recent  Episode of "itis"  was self treated with empiric antibiotics to resolution  .  She has follow up with Gen Surgery      I discussed the assessment and treatment plan with the patient. The patient was provided an opportunity to ask questions and all were answered. The patient agreed with the plan and demonstrated an understanding of the instructions.   The patient was advised to call back or seek an in-person evaluation if the symptoms worsen or if the condition fails to improve as anticipated.   I spent 30  minutes dedicated to the care of this patient on the date of this encounter to include review of her medical history,  review of her previous CT scans and surgical consultations,  combined video/audio time with the patient , and post visit ordering of testing and therapeutics.    [name], MD

## 2021-03-16 NOTE — Patient Instructions (Addendum)
Your annual mammogram has been ordered.  You are encouraged (required) to call to make your appointment at [facility]    Try substituting Benefiber for the metamucil.  Is is tasteless and contains prebiotics  Colace is a stool softener (docusate is the generic)  100 to 200 mg daily  Magnesium comes in a 250 mg capsule,  once daily for constipation.  NOT THE 400 MG DOSE!  TOO STRONG   SEND ME BP READINGS.  GOAL IS 130/80 OR LESS

## 2021-03-16 NOTE — Assessment & Plan Note (Signed)
Etiology unclear.  She is s/p TAH/BSO, is taking HRT , including testosterone and estrogen, and has had a CT abd pelvis in August which showed no masses.  Pelvic floor muscle dysfunction suspected.  GYN eval needed first.

## 2021-03-16 NOTE — Assessment & Plan Note (Signed)
She has been advised to avoid  surgery by a general surgeon and wants to avoid surgery because of the risk of diverting colectomy .  She has been following  A healthy gut diet.    Her recent  Episode of "itis" was self treated with empiric antibiotics to resolution  .  She has follow up with Gen Surgery

## 2021-03-17 ENCOUNTER — Other Ambulatory Visit: Payer: Self-pay

## 2021-03-18 NOTE — Addendum Note (Signed)
Addended by: [name] on: 03/18/2021 05:53 PM   Modules accepted: Level of Service

## 2021-03-22 ENCOUNTER — Other Ambulatory Visit: Payer: Self-pay | Admitting: Internal Medicine

## 2021-03-22 DIAGNOSIS — K5732 Diverticulitis of large intestine without perforation or abscess without bleeding: Secondary | ICD-10-CM | POA: Diagnosis not present

## 2021-03-23 NOTE — Telephone Encounter (Signed)
Last OV 03/16/21 no showed lab on 03/17/21 last fill 02/16/21.

## 2021-03-30 ENCOUNTER — Ambulatory Visit: Payer: Self-pay | Admitting: Surgery

## 2021-03-30 NOTE — H&P (Signed)
Subjective:   CC: Diverticulitis of large intestine without perforation or abscess without bleeding [K57.32]  HPI: Paula Moses is a 56 y.o. female who returns for evaluation of above. First noted 1 year ago. Recurrent episode about three weeks ago, resolved with abx. Now ready to discuss surgical resection.  Past Medical History: has a past medical history of Depression, GERD (gastroesophageal reflux disease), Hyperlipidemia, Hypertension, Metabolic syndrome, and Primary osteoarthritis of both hips (02/13/2017).  Past Surgical History: has a past surgical history that includes Combined augmentation mammaplasty and abdominoplasty; laparoscopy diagnostic; Cholecystectomy (1997); Hysterectomy Vaginal (2015); Hysterectomy; and Right Total Hip Arthroplasty Anterior Approach 10/26/17.  Family History: family history includes Brain cancer in her mother; Diabetes type II in her father; Heart disease in her father; Pacemaker in her father.  Social History: reports that she has never smoked. She has never used smokeless tobacco. She reports that she does not drink alcohol and does not use drugs.  Current Medications: has a current medication list which includes the following prescription(s): ascorbic acid/vitamin e/biotin, bupropion, dibucaine, estradiol, furosemide, gabapentin, hyaluronic acid, meloxicam, montelukast, multivit,calc,mins/iron/folic, nystatin, polyethylene glycol, progesterone, rizatriptan, and valsartan-hydrochlorothiazide.  Allergies:  Allergies  Allergen Reactions   Aspirin Other (See Comments), Unknown and Shortness Of Breath  congestion congestion   Hydromorphone Shortness Of Breath   Nsaids (Non-Steroidal Anti-Inflammatory Drug) Shortness Of Breath   Codeine Itching   Percocet [Oxycodone-Acetaminophen] Itching   Promethazine Itching   Adhes. Band-Tape-Benzalkonium Other (See Comments)  Surgical tape caused blisters   Fish Oil Other (See Comments)  Wheezing,chest  tightness,sneezing   Promethazine Hcl Unknown   Tapentadol Rash  Surgical tape caused blisters   ROS:  A 15 point review of systems was performed and pertinent positives and negatives noted in HPI  Objective:    BP 128/75   Pulse 62   Ht 165.1 cm (5\' 5" )   Wt 86.2 kg (190 lb)   BMI 31.62 kg/m   Constitutional : alert, appears stated age, cooperative and no distress  Lymphatics/Throat: no asymmetry, masses, or scars  Respiratory: clear to auscultation bilaterally  Cardiovascular: regular rate and rhythm  Gastrointestinal: soft, no guarding, minimal TTP in left lower quadrant..  Musculoskeletal: Steady gait and movement  Skin: Cool and moist  Psychiatric: Normal affect, non-agitated, not confused    LABS:  n/a   RADS: CLINICAL DATA:  Nonlocalized abdominal pain which began 2 days  prior, history of diverticulitis   EXAM:  CT ABDOMEN AND PELVIS WITH CONTRAST   TECHNIQUE:  Multidetector CT imaging of the abdomen and pelvis was performed  using the standard protocol following bolus administration of  intravenous contrast.   CONTRAST:  181mL OMNIPAQUE IOHEXOL 300 MG/ML  SOLN   COMPARISON:  CT 03/18/2019   FINDINGS:  Lower chest: Bilateral breast prostheses without acute CT evident  complication. Lung bases are clear. Normal heart size. No  pericardial effusion.   Hepatobiliary: No worrisome focal liver lesions. Smooth liver  surface contour. Normal hepatic attenuation. Prior cholecystectomy.  No significant biliary ductal dilatation accounting for post  cholecystectomy reservoir effect. No visible intraductal gallstones.   Pancreas: No pancreatic ductal dilatation or surrounding  inflammatory changes.   Spleen: Normal in size. No concerning splenic lesions.   Adrenals/Urinary Tract: Normal adrenals. Kidneys enhance and excrete  symmetrically. Stable region of cortical scarring in the upper pole  left kidney. No worrisome renal mass. No urolithiasis or   hydronephrosis. Portion of the pelvis obscured by streak artifact.  Slightly asymmetric bladder wall thickening  adjacent a region of  left pelvic inflammation is favored to be reactive. No visible  bladder calculi or debris.   Stomach/Bowel: Distal esophagus, stomach and duodenum are  unremarkable. No small bowel thickening or dilatation. Noninflamed  appendix in the right lower quadrant. Proximal colon is  unremarkable. There is however more segmental thickening of the  proximal to mid sigmoid with extensive surrounding phlegmon and  inflammatory changes much of which appears centered on upon a  culprit diverticulum abutting the left pelvic sidewall (5/41).  Adjacent reactive free fluid is noted. No clear extraluminal gas is  present. No organized abscess or collection. Inflammatory changes  extend across the left pelvic sidewall with peritoneal thickening  and enhancement.   Vascular/Lymphatic: Increased vascularity with adjacent reactive  appearing lymph nodes in the left lower quadrant in the vicinity of  the inflamed sigmoid colon. No other suspicious or pathologically  enlarged lymph nodes are seen in the abdomen or pelvis.  Atherosclerotic plaque in the abdominal aorta and branch vessels  without aneurysm, ectasia or other acute vascular abnormality.   Reproductive: Post hysterectomy. Inflammatory changes in the left  adnexa related to the colon as above.   Other: Extensive phlegmonous changes and peritoneal thickening  adjacent the thickened, inflamed sigmoid colon. Some distribution of  the low-attenuation free fluid throughout the abdomen and pelvis. No  evidence of organized abscess or collection. No free air. No bowel  containing hernia. Portion of the pelvis obscured by streak artifact  from bilateral hip prostheses.   Musculoskeletal: Bilateral total hip arthroplasties without acute  hardware failure, periprosthetic fracture or other complication.  Resulting streak  artifact. Grade 2 anterolisthesis L5 on S1 with  bilateral L5 pars defects. Minimal retrolisthesis L4 on L5 without  additional spondylolysis. Multilevel degenerative changes are  present in the imaged portions of the spine.   IMPRESSION:  1. Circumferentially thickened sigmoid colon centered in a region of  numerous colonic diverticula with a larger inflamed culprit  diverticulum along the left pelvic sidewall compatible with an acute  diverticulitis. Extensive surrounding phlegmon and features  suggestive of adjacent peritonitis albeit without extraluminal gas,  organized collection or abscess at this time though please note  portions of the pelvis are poorly visualized due to streak artifact  from bilateral hip prostheses. This is in a similar location to  prior diverticulitis seen on comparison imaging 03/18/2019.  2. Bilateral hip arthroplasty without acute complication.  3. Chronic left renal scarring.  4. Prior cholecystectomy.  5.  Aortic Atherosclerosis (ICD10-I70.0).   Electronically Signed    By: Lovena Le M.D.    On: 09/08/2020 20:17  Assessment:    Diverticulitis of large intestine without perforation or abscess without bleeding [K57.32]  Plan:    Discussed pathophisiology in depth. The risk of laparoscopic colon resection surgery includes, but not limited to, recurrence, bleeding, chronic pain, post-op infxn, post-op SBO or ileus, hernias, resection of bowel, re-anastamosis, possible ostomy placement and need for re-operation to address said risks. The risks of general anesthetic, if used, includes MI, CVA, sudden death or even reaction to anesthetic medications also discussed. Alternatives include continued observation. Benefits include possible symptom relief, preventing further decline in health and possible death.  Typical post-op recovery time of additional days in hospital for observation afterwards also discussed.  Pt after discussion wishes to proceed

## 2021-04-12 ENCOUNTER — Encounter: Payer: Self-pay | Admitting: Internal Medicine

## 2021-04-13 MED ORDER — TIRZEPATIDE 10 MG/0.5ML ~~LOC~~ SOAJ: 10.0000 mg | SUBCUTANEOUS | 0 refills | Status: DC

## 2021-04-22 ENCOUNTER — Ambulatory Visit: Payer: Self-pay | Admitting: Gastroenterology

## 2021-04-22 ENCOUNTER — Other Ambulatory Visit: Payer: Self-pay

## 2021-04-23 MED ORDER — TIRZEPATIDE 12.5 MG/0.5ML ~~LOC~~ SOAJ: 12.5000 mg | SUBCUTANEOUS | 0 refills | Status: DC

## 2021-04-23 NOTE — Addendum Note (Signed)
Addended by: [name] on: 04/23/2021 01:36 PM   Modules accepted: Orders

## 2021-04-27 DIAGNOSIS — K5732 Diverticulitis of large intestine without perforation or abscess without bleeding: Secondary | ICD-10-CM | POA: Diagnosis not present

## 2021-04-27 DIAGNOSIS — R1032 Left lower quadrant pain: Secondary | ICD-10-CM | POA: Diagnosis not present

## 2021-05-11 ENCOUNTER — Other Ambulatory Visit: Payer: Self-pay

## 2021-05-11 ENCOUNTER — Other Ambulatory Visit
Admission: RE | Admit: 2021-05-11 | Discharge: 2021-05-11 | Disposition: A | Discharge status: Home / Self Care | Payer: 59 | Source: Ambulatory Visit | Attending: Surgery | Admitting: Surgery

## 2021-05-11 VITALS — Ht 64.0 in | Wt 160.0 lb

## 2021-05-11 DIAGNOSIS — I10 Essential (primary) hypertension: Secondary | ICD-10-CM

## 2021-05-11 NOTE — Patient Instructions (Addendum)
Your procedure is scheduled on: Monday May 17, 2021. Report to Day Surgery inside [facility] 2nd floor, stop by admissions desk before getting on elevator.  To find out your arrival time please call [phone] between 1PM - 3PM on Friday May 14, 2021.  Remember: Instructions that are not followed completely may result in serious medical risk,  up to and including death, or upon the discretion of your surgeon and anesthesiologist your  surgery may need to be rescheduled.     _X__ 1. Do not eat food after midnight the night before your procedure.                 No chewing gum or hard candies. You may drink clear liquids up to 2 hours                 before you are scheduled to arrive for your surgery- DO not drink clear                 liquids within 2 hours of the start of your surgery.                 Clear Liquids include:  water.  __X__2.  On the morning of surgery brush your teeth with toothpaste and water, you                may rinse your mouth with mouthwash if you wish.  Do not swallow any toothpaste or mouthwash.     _X__ 3.  No Alcohol for 24 hours before or after surgery.   _X__ 4.  Do Not Smoke or use e-cigarettes For 24 Hours Prior to Your Surgery.                 Do not use any chewable tobacco products for at least 6 hours prior to                 Surgery.  _X__  5.  Do not use any recreational drugs (marijuana, cocaine, heroin, ecstasy, MDMA or other)                For at least one week prior to your surgery.  Combination of these drugs with anesthesia                May have life threatening results.  ____  6.  Bring all medications with you on the day of surgery if instructed.   __X_ 7.  Notify your doctor if there is any change in your medical condition      (cold, fever, infections).     Do not wear jewelry, make-up, hairpins, clips or nail polish. Do not wear lotions, powders, or perfumes. You may wear deodorant. Do not shave 48  hours prior to surgery.  Do not bring valuables to the hospital.    [facility] is not responsible for any belongings or valuables.  Contacts, dentures or bridgework may not be worn into surgery. Leave your suitcase in the car. After surgery it may be brought to your room. For patients admitted to the hospital, discharge time is determined by your treatment team.   Patients discharged the day of surgery will not be allowed to drive home.   Make arrangements for someone to be with you for the first 24 hours of your Same Day Discharge.   __X__ Take these medicines the morning of surgery with A SIP OF WATER:    1. omeprazole (PRILOSEC) 40 MG  2.   3.   4.  5.  6.  __X__ Follow doctor's instructions for the prep.  ____ Fleet Enema (as directed)   ___X_ Use CHG Soap (or wipes) as directed  ____ Use Benzoyl Peroxide Gel as instructed  ____ Use inhalers on the day of surgery  ____ Stop metformin 2 days prior to surgery    ____ Take 1/2 of usual insulin dose the night before surgery. No insulin the morning          of surgery.   ____ Call your PCP, cardiologist, or Pulmonologist if taking Coumadin/Plavix/aspirin and ask when to stop before your surgery.   __X__ One Week prior to surgery- Stop Anti-inflammatories such as Ibuprofen, Aleve, Advil, Motrin, meloxicam (MOBIC), diclofenac, etodolac, ketorolac, Toradol, Daypro, piroxicam, Goody's or BC powders. OK TO USE TYLENOL IF NEEDED   __X__ One Week prior to surgery- stop all supplements until after surgery.    ____ Bring C-Pap to the hospital.    If you have any questions regarding your pre-procedure instructions,  Please call Pre-admit Testing at [phone]

## 2021-05-13 ENCOUNTER — Other Ambulatory Visit
Admission: RE | Admit: 2021-05-13 | Discharge: 2021-05-13 | Disposition: A | Discharge status: Home / Self Care | Payer: 59 | Source: Ambulatory Visit | Attending: Surgery | Admitting: Surgery

## 2021-05-13 ENCOUNTER — Other Ambulatory Visit: Payer: 59

## 2021-05-13 ENCOUNTER — Other Ambulatory Visit: Payer: Self-pay

## 2021-05-13 DIAGNOSIS — Z01818 Encounter for other preprocedural examination: Secondary | ICD-10-CM | POA: Insufficient documentation

## 2021-05-13 DIAGNOSIS — I10 Essential (primary) hypertension: Secondary | ICD-10-CM | POA: Diagnosis not present

## 2021-05-13 DIAGNOSIS — Z20822 Contact with and (suspected) exposure to covid-19: Secondary | ICD-10-CM | POA: Insufficient documentation

## 2021-05-13 LAB — CBC
HCT: 42.3 % (ref 36.0–46.0)
Hemoglobin: 14.6 g/dL (ref 12.0–15.0)
MCH: 30.1 pg (ref 26.0–34.0)
MCHC: 34.5 g/dL (ref 30.0–36.0)
MCV: 87.2 fL (ref 80.0–100.0)
Platelets: 285 10*3/uL (ref 150–400)
RBC: 4.85 MIL/uL (ref 3.87–5.11)
RDW: 13.4 % (ref 11.5–15.5)
WBC: 6.4 10*3/uL (ref 4.0–10.5)
nRBC: 0 % (ref 0.0–0.2)

## 2021-05-13 LAB — SARS CORONAVIRUS 2 (TAT 6-24 HRS): SARS Coronavirus 2: NEGATIVE

## 2021-05-13 LAB — BASIC METABOLIC PANEL
Anion gap: 6 (ref 5–15)
BUN: 11 mg/dL (ref 6–20)
CO2: 26 mmol/L (ref 22–32)
Calcium: 9.3 mg/dL (ref 8.9–10.3)
Chloride: 106 mmol/L (ref 98–111)
Creatinine, Ser: 0.95 mg/dL (ref 0.44–1.00)
GFR, Estimated: >60 mL/min (ref >60)
Glucose, Bld: 93 mg/dL (ref 70–99)
Potassium: 3.7 mmol/L (ref 3.5–5.1)
Sodium: 138 mmol/L (ref 135–145)

## 2021-05-13 LAB — TYPE AND SCREEN
ABO/RH(D): A POS
Antibody Screen: NEGATIVE

## 2021-05-14 ENCOUNTER — Other Ambulatory Visit: Payer: 59

## 2021-05-17 ENCOUNTER — Other Ambulatory Visit: Payer: Self-pay

## 2021-05-17 ENCOUNTER — Encounter
Admission: RE | Disposition: A | Discharge status: Home / Self Care | Payer: Self-pay | Source: Home / Self Care | Attending: Surgery

## 2021-05-17 ENCOUNTER — Inpatient Hospital Stay
Admission: RE | Admit: 2021-05-17 | Discharge: 2021-05-21 | DRG: 331 | Disposition: A | Discharge status: Home / Self Care | Payer: 59 | Attending: Surgery | Admitting: Surgery

## 2021-05-17 ENCOUNTER — Encounter: Payer: Self-pay | Admitting: Surgery

## 2021-05-17 ENCOUNTER — Inpatient Hospital Stay: Payer: 59 | Admitting: Urgent Care

## 2021-05-17 ENCOUNTER — Inpatient Hospital Stay: Payer: 59 | Admitting: Anesthesiology

## 2021-05-17 DIAGNOSIS — Z20822 Contact with and (suspected) exposure to covid-19: Secondary | ICD-10-CM | POA: Diagnosis not present

## 2021-05-17 DIAGNOSIS — K5732 Diverticulitis of large intestine without perforation or abscess without bleeding: Secondary | ICD-10-CM | POA: Diagnosis not present

## 2021-05-17 DIAGNOSIS — I10 Essential (primary) hypertension: Secondary | ICD-10-CM | POA: Diagnosis present

## 2021-05-17 DIAGNOSIS — Z808 Family history of malignant neoplasm of other organs or systems: Secondary | ICD-10-CM

## 2021-05-17 DIAGNOSIS — K5792 Diverticulitis of intestine, part unspecified, without perforation or abscess without bleeding: Secondary | ICD-10-CM | POA: Diagnosis present

## 2021-05-17 DIAGNOSIS — Z90710 Acquired absence of both cervix and uterus: Secondary | ICD-10-CM | POA: Diagnosis not present

## 2021-05-17 DIAGNOSIS — Z8249 Family history of ischemic heart disease and other diseases of the circulatory system: Secondary | ICD-10-CM | POA: Diagnosis not present

## 2021-05-17 DIAGNOSIS — E7800 Pure hypercholesterolemia, unspecified: Secondary | ICD-10-CM | POA: Diagnosis not present

## 2021-05-17 DIAGNOSIS — K219 Gastro-esophageal reflux disease without esophagitis: Secondary | ICD-10-CM | POA: Diagnosis present

## 2021-05-17 DIAGNOSIS — Z96643 Presence of artificial hip joint, bilateral: Secondary | ICD-10-CM | POA: Diagnosis present

## 2021-05-17 SURGERY — COLECTOMY, SIGMOID, ROBOT-ASSISTED
Anesthesia: General | Site: Abdomen

## 2021-05-17 MED ORDER — MORPHINE SULFATE (PF) 2 MG/ML IV SOLN
1.0000 mg | INTRAVENOUS | Status: DC | PRN
  Administered 2021-05-17: 1 mg via INTRAVENOUS
  Filled 2021-05-17: qty 1

## 2021-05-17 MED ORDER — KETOROLAC TROMETHAMINE 30 MG/ML IJ SOLN
30.0000 mg | Freq: Four times a day (QID) | INTRAMUSCULAR | Status: DC | PRN
  Administered 2021-05-17 – 2021-05-19 (×5): 30 mg via INTRAVENOUS
  Filled 2021-05-17 (×5): qty 1

## 2021-05-17 MED ORDER — LACTATED RINGERS IV SOLN: INTRAVENOUS | Status: DC

## 2021-05-17 MED ORDER — KETAMINE HCL 10 MG/ML IJ SOLN
INTRAMUSCULAR | Status: DC | PRN
  Administered 2021-05-17: 30 mg via INTRAVENOUS
  Administered 2021-05-17: 20 mg via INTRAVENOUS

## 2021-05-17 MED ORDER — GABAPENTIN 300 MG PO CAPS
ORAL_CAPSULE | ORAL | Status: AC
  Administered 2021-05-17: 300 mg via ORAL
  Filled 2021-05-17: qty 1

## 2021-05-17 MED ORDER — GLYCOPYRROLATE 0.2 MG/ML IJ SOLN
INTRAMUSCULAR | Status: DC | PRN
  Administered 2021-05-17: .2 mg via INTRAVENOUS

## 2021-05-17 MED ORDER — FENTANYL CITRATE (PF) 100 MCG/2ML IJ SOLN
25.0000 ug | INTRAMUSCULAR | Status: DC | PRN
  Administered 2021-05-17: 25 ug via INTRAVENOUS
  Administered 2021-05-17: 50 ug via INTRAVENOUS
  Administered 2021-05-17: 25 ug via INTRAVENOUS

## 2021-05-17 MED ORDER — BUPIVACAINE HCL (PF) 0.5 % IJ SOLN
INTRAMUSCULAR | Status: DC | PRN
  Administered 2021-05-17: 30 mL

## 2021-05-17 MED ORDER — SODIUM CHLORIDE 0.9 % IV SOLN
2.0000 g | INTRAVENOUS | Status: AC
  Administered 2021-05-17: 2 g via INTRAVENOUS

## 2021-05-17 MED ORDER — CHLORHEXIDINE GLUCONATE CLOTH 2 % EX PADS: 6.0000 | MEDICATED_PAD | Freq: Once | CUTANEOUS | Status: DC

## 2021-05-17 MED ORDER — MIDAZOLAM HCL 2 MG/2ML IJ SOLN
INTRAMUSCULAR | Status: AC
  Filled 2021-05-17: qty 2

## 2021-05-17 MED ORDER — BUPROPION HCL ER (XL) 150 MG PO TB24
300.0000 mg | ORAL_TABLET | Freq: Every day | ORAL | Status: DC
  Filled 2021-05-17 (×3): qty 2

## 2021-05-17 MED ORDER — PROGESTERONE MICRONIZED 100 MG PO CAPS
100.0000 mg | ORAL_CAPSULE | Freq: Every day | ORAL | Status: DC
  Administered 2021-05-17 – 2021-05-20 (×4): 100 mg via ORAL
  Filled 2021-05-17 (×5): qty 1

## 2021-05-17 MED ORDER — PROPOFOL 500 MG/50ML IV EMUL
INTRAVENOUS | Status: AC
  Filled 2021-05-17: qty 50

## 2021-05-17 MED ORDER — INDOCYANINE GREEN 25 MG IV SOLR
5.0000 mg | Freq: Once | INTRAVENOUS | Status: AC
  Administered 2021-05-17: 5 mg via INTRAVENOUS
  Filled 2021-05-17: qty 2

## 2021-05-17 MED ORDER — FENTANYL CITRATE (PF) 100 MCG/2ML IJ SOLN
INTRAMUSCULAR | Status: AC
  Administered 2021-05-17: 25 ug via INTRAVENOUS
  Filled 2021-05-17: qty 2

## 2021-05-17 MED ORDER — SODIUM CHLORIDE 0.9 % IV SOLN
INTRAVENOUS | Status: DC | PRN
  Administered 2021-05-17: 70 mL

## 2021-05-17 MED ORDER — ONDANSETRON HCL 4 MG/2ML IJ SOLN
INTRAMUSCULAR | Status: AC
  Filled 2021-05-17: qty 2

## 2021-05-17 MED ORDER — ONDANSETRON HCL 4 MG/2ML IJ SOLN: 4.0000 mg | Freq: Once | INTRAMUSCULAR | Status: DC | PRN

## 2021-05-17 MED ORDER — DEXMEDETOMIDINE HCL IN NACL 200 MCG/50ML IV SOLN
INTRAVENOUS | Status: DC | PRN
  Administered 2021-05-17 (×2): 8 ug via INTRAVENOUS

## 2021-05-17 MED ORDER — MIDAZOLAM HCL 2 MG/2ML IJ SOLN
INTRAMUSCULAR | Status: DC | PRN
  Administered 2021-05-17: 2 mg via INTRAVENOUS

## 2021-05-17 MED ORDER — GABAPENTIN 300 MG PO CAPS: 300.0000 mg | ORAL_CAPSULE | ORAL | Status: AC

## 2021-05-17 MED ORDER — SODIUM CHLORIDE 0.9 % IV SOLN: INTRAVENOUS | Status: DC

## 2021-05-17 MED ORDER — ORAL CARE MOUTH RINSE: 15.0000 mL | Freq: Once | OROMUCOSAL | Status: AC

## 2021-05-17 MED ORDER — PROPOFOL 10 MG/ML IV BOLUS
INTRAVENOUS | Status: DC | PRN
Start: 2021-05-17 — End: 2021-05-17
  Administered 2021-05-17: 200 mg via INTRAVENOUS

## 2021-05-17 MED ORDER — SODIUM CHLORIDE 0.9 % IR SOLN
Status: DC | PRN
  Administered 2021-05-17: 200 mL

## 2021-05-17 MED ORDER — DEXAMETHASONE SODIUM PHOSPHATE 10 MG/ML IJ SOLN
INTRAMUSCULAR | Status: DC | PRN
  Administered 2021-05-17: 10 mg via INTRAVENOUS

## 2021-05-17 MED ORDER — CHLORHEXIDINE GLUCONATE 0.12 % MT SOLN: 15.0000 mL | Freq: Once | OROMUCOSAL | Status: AC

## 2021-05-17 MED ORDER — OXYMETAZOLINE HCL 0.05 % NA SOLN
1.0000 | Freq: Two times a day (BID) | NASAL | Status: AC
  Administered 2021-05-17 – 2021-05-18 (×2): 1 via NASAL
  Filled 2021-05-17: qty 15

## 2021-05-17 MED ORDER — BUPIVACAINE LIPOSOME 1.3 % IJ SUSP
INTRAMUSCULAR | Status: AC
  Filled 2021-05-17: qty 20

## 2021-05-17 MED ORDER — KETAMINE HCL 50 MG/5ML IJ SOSY
PREFILLED_SYRINGE | INTRAMUSCULAR | Status: AC
  Filled 2021-05-17: qty 5

## 2021-05-17 MED ORDER — APREPITANT 40 MG PO CAPS
ORAL_CAPSULE | ORAL | Status: AC
  Filled 2021-05-17: qty 1

## 2021-05-17 MED ORDER — PANTOPRAZOLE SODIUM 40 MG IV SOLR
40.0000 mg | Freq: Every day | INTRAVENOUS | Status: DC
  Administered 2021-05-17 – 2021-05-20 (×4): 40 mg via INTRAVENOUS
  Filled 2021-05-17 (×4): qty 10

## 2021-05-17 MED ORDER — ACETAMINOPHEN 500 MG PO TABS
ORAL_TABLET | ORAL | Status: AC
  Administered 2021-05-17: 1000 mg via ORAL
  Filled 2021-05-17: qty 2

## 2021-05-17 MED ORDER — SUGAMMADEX SODIUM 200 MG/2ML IV SOLN
INTRAVENOUS | Status: DC | PRN
Start: 2021-05-17 — End: 2021-05-17
  Administered 2021-05-17: 200 mg via INTRAVENOUS

## 2021-05-17 MED ORDER — CELECOXIB 200 MG PO CAPS
200.0000 mg | ORAL_CAPSULE | Freq: Two times a day (BID) | ORAL | Status: DC
  Filled 2021-05-17 (×2): qty 1

## 2021-05-17 MED ORDER — VISTASEAL 4 ML SINGLE DOSE KIT
PACK | CUTANEOUS | Status: DC | PRN
Start: 2021-05-17 — End: 2021-05-17
  Administered 2021-05-17: 4 mL via TOPICAL

## 2021-05-17 MED ORDER — ACETAMINOPHEN 325 MG PO TABS
650.0000 mg | ORAL_TABLET | Freq: Four times a day (QID) | ORAL | Status: DC | PRN
  Administered 2021-05-17: 650 mg via ORAL
  Filled 2021-05-17: qty 2

## 2021-05-17 MED ORDER — ACETAMINOPHEN 500 MG PO TABS: 1000.0000 mg | ORAL_TABLET | ORAL | Status: AC

## 2021-05-17 MED ORDER — APREPITANT 40 MG PO CAPS
40.0000 mg | ORAL_CAPSULE | Freq: Once | ORAL | Status: AC
  Administered 2021-05-17: 40 mg via ORAL

## 2021-05-17 MED ORDER — SODIUM CHLORIDE (PF) 0.9 % IJ SOLN
INTRAMUSCULAR | Status: AC
  Filled 2021-05-17: qty 50

## 2021-05-17 MED ORDER — ENOXAPARIN SODIUM 40 MG/0.4ML IJ SOSY
40.0000 mg | PREFILLED_SYRINGE | INTRAMUSCULAR | Status: DC
  Administered 2021-05-18: 40 mg via SUBCUTANEOUS
  Filled 2021-05-17: qty 0.4

## 2021-05-17 MED ORDER — DEXAMETHASONE SODIUM PHOSPHATE 10 MG/ML IJ SOLN
INTRAMUSCULAR | Status: AC
Start: 2021-05-17 — End: ?
  Filled 2021-05-17: qty 1

## 2021-05-17 MED ORDER — SODIUM CHLORIDE 0.9 % IV SOLN
INTRAVENOUS | Status: AC
  Filled 2021-05-17: qty 2

## 2021-05-17 MED ORDER — LIDOCAINE HCL (CARDIAC) PF 100 MG/5ML IV SOSY
PREFILLED_SYRINGE | INTRAVENOUS | Status: DC | PRN
  Administered 2021-05-17: 100 mg via INTRAVENOUS

## 2021-05-17 MED ORDER — MELOXICAM 7.5 MG PO TABS
7.5000 mg | ORAL_TABLET | Freq: Every day | ORAL | Status: DC
  Filled 2021-05-17: qty 1

## 2021-05-17 MED ORDER — FENTANYL CITRATE (PF) 100 MCG/2ML IJ SOLN
INTRAMUSCULAR | Status: AC
  Filled 2021-05-17: qty 2

## 2021-05-17 MED ORDER — ONDANSETRON HCL 4 MG/2ML IJ SOLN: 4.0000 mg | Freq: Four times a day (QID) | INTRAMUSCULAR | Status: DC | PRN

## 2021-05-17 MED ORDER — PHENYLEPHRINE HCL-NACL 20-0.9 MG/250ML-% IV SOLN
INTRAVENOUS | Status: DC | PRN
  Administered 2021-05-17: 40 ug/min via INTRAVENOUS

## 2021-05-17 MED ORDER — BUPIVACAINE HCL (PF) 0.5 % IJ SOLN
INTRAMUSCULAR | Status: AC
  Filled 2021-05-17: qty 30

## 2021-05-17 MED ORDER — CELECOXIB 200 MG PO CAPS
ORAL_CAPSULE | ORAL | Status: AC
  Administered 2021-05-17: 200 mg via ORAL
  Filled 2021-05-17: qty 1

## 2021-05-17 MED ORDER — MONTELUKAST SODIUM 10 MG PO TABS
10.0000 mg | ORAL_TABLET | Freq: Every day | ORAL | Status: DC
  Administered 2021-05-17 – 2021-05-20 (×4): 10 mg via ORAL
  Filled 2021-05-17 (×4): qty 1

## 2021-05-17 MED ORDER — EPHEDRINE SULFATE (PRESSORS) 50 MG/ML IJ SOLN
INTRAMUSCULAR | Status: DC | PRN
  Administered 2021-05-17: 10 mg via INTRAVENOUS
  Administered 2021-05-17: 5 mg via INTRAVENOUS

## 2021-05-17 MED ORDER — MORPHINE SULFATE (PF) 2 MG/ML IV SOLN
2.0000 mg | INTRAVENOUS | Status: DC | PRN
  Administered 2021-05-17 – 2021-05-19 (×12): 2 mg via INTRAVENOUS
  Filled 2021-05-17 (×12): qty 1

## 2021-05-17 MED ORDER — HYDROCHLOROTHIAZIDE 25 MG PO TABS
25.0000 mg | ORAL_TABLET | Freq: Every day | ORAL | Status: DC
Start: 2021-05-17 — End: 2021-05-18
  Administered 2021-05-18: 25 mg via ORAL
  Filled 2021-05-17: qty 1

## 2021-05-17 MED ORDER — FENTANYL CITRATE (PF) 100 MCG/2ML IJ SOLN
INTRAMUSCULAR | Status: DC | PRN
  Administered 2021-05-17 (×3): 25 ug via INTRAVENOUS
  Administered 2021-05-17: 50 ug via INTRAVENOUS
  Administered 2021-05-17: 25 ug via INTRAVENOUS

## 2021-05-17 MED ORDER — ONDANSETRON HCL 4 MG/2ML IJ SOLN
INTRAMUSCULAR | Status: DC | PRN
  Administered 2021-05-17: 4 mg via INTRAVENOUS

## 2021-05-17 MED ORDER — ONDANSETRON 4 MG PO TBDP: 4.0000 mg | ORAL_TABLET | Freq: Four times a day (QID) | ORAL | Status: DC | PRN

## 2021-05-17 MED ORDER — ROCURONIUM BROMIDE 100 MG/10ML IV SOLN
INTRAVENOUS | Status: DC | PRN
  Administered 2021-05-17: 10 mg via INTRAVENOUS
  Administered 2021-05-17 (×2): 20 mg via INTRAVENOUS
  Administered 2021-05-17: 50 mg via INTRAVENOUS
  Administered 2021-05-17: 20 mg via INTRAVENOUS
  Administered 2021-05-17: 10 mg via INTRAVENOUS

## 2021-05-17 MED ORDER — CHLORHEXIDINE GLUCONATE 0.12 % MT SOLN
OROMUCOSAL | Status: AC
  Administered 2021-05-17: 15 mL via OROMUCOSAL
  Filled 2021-05-17: qty 15

## 2021-05-17 MED ORDER — PROPOFOL 10 MG/ML IV BOLUS
INTRAVENOUS | Status: AC
  Filled 2021-05-17: qty 20

## 2021-05-17 MED ORDER — GABAPENTIN 300 MG PO CAPS
300.0000 mg | ORAL_CAPSULE | Freq: Two times a day (BID) | ORAL | Status: DC
  Administered 2021-05-17 – 2021-05-21 (×8): 300 mg via ORAL
  Filled 2021-05-17 (×8): qty 1

## 2021-05-17 MED ORDER — SEVOFLURANE IN SOLN
RESPIRATORY_TRACT | Status: AC
  Filled 2021-05-17: qty 250

## 2021-05-17 MED ORDER — CELECOXIB 200 MG PO CAPS: 200.0000 mg | ORAL_CAPSULE | ORAL | Status: AC

## 2021-05-17 SURGICAL SUPPLY — 96 items
APPLICATOR VISTASEAL FLEXIBLE (MISCELLANEOUS) ×1 IMPLANT
BAG PRESSURE INFUS OVAL 1000CC (MISCELLANEOUS) ×1 IMPLANT
BLADE CLIPPER SURG (BLADE) ×3 IMPLANT
BLADE SURG SZ10 CARB STEEL (BLADE) IMPLANT
BLADE SURG SZ11 CARB STEEL (BLADE) ×3 IMPLANT
BULB RESERV EVAC DRAIN JP 100C (MISCELLANEOUS) ×1 IMPLANT
CANNULA REDUC XI 12-8 STAPL (CANNULA) ×1
CANNULA REDUCER 12-8 DVNC XI (CANNULA) ×2 IMPLANT
COVER TIP SHEARS 8 DVNC (MISCELLANEOUS) ×2 IMPLANT
COVER TIP SHEARS 8MM DA VINCI (MISCELLANEOUS) ×1
DERMABOND ADVANCED (GAUZE/BANDAGES/DRESSINGS) ×1
DERMABOND ADVANCED .7 DNX12 (GAUZE/BANDAGES/DRESSINGS) IMPLANT
DRAIN CHANNEL JP 15F RND 16 (MISCELLANEOUS) ×1 IMPLANT
DRAPE ARM DVNC X/XI (DISPOSABLE) ×8 IMPLANT
DRAPE COLUMN DVNC XI (DISPOSABLE) ×2 IMPLANT
DRAPE DA VINCI XI ARM (DISPOSABLE) ×4
DRAPE DA VINCI XI COLUMN (DISPOSABLE) ×1
DRAPE LEGGINS SURG 28X43 STRL (DRAPES) ×3 IMPLANT
DRAPE UNDER BUTTOCK W/FLU (DRAPES) ×3 IMPLANT
DRSG OPSITE POSTOP 4X10 (GAUZE/BANDAGES/DRESSINGS) IMPLANT
DRSG OPSITE POSTOP 4X8 (GAUZE/BANDAGES/DRESSINGS) IMPLANT
ELECT CAUTERY BLADE 6.4 (BLADE) IMPLANT
ELECT REM PT RETURN 9FT ADLT (ELECTROSURGICAL) ×3
ELECTRODE REM PT RTRN 9FT ADLT (ELECTROSURGICAL) ×2 IMPLANT
GLOVE SURG SYN 6.5 ES PF (GLOVE) ×18 IMPLANT
GLOVE SURG SYN 6.5 PF PI (GLOVE) ×6 IMPLANT
GLOVE SURG UNDER POLY LF SZ7 (GLOVE) ×12 IMPLANT
GOWN STRL REUS W/ TWL LRG LVL3 (GOWN DISPOSABLE) ×12 IMPLANT
GOWN STRL REUS W/TWL LRG LVL3 (GOWN DISPOSABLE) ×6
GRASPER SUT TROCAR 14GX15 (MISCELLANEOUS) IMPLANT
HANDLE YANKAUER SUCT BULB TIP (MISCELLANEOUS) ×3 IMPLANT
IRRIGATION STRYKERFLOW (MISCELLANEOUS) IMPLANT
IRRIGATOR STRYKERFLOW (MISCELLANEOUS)
IRRIGATOR SUCT 8 DISP DVNC XI (IRRIGATION / IRRIGATOR) IMPLANT
IRRIGATOR SUCTION 8MM XI DISP (IRRIGATION / IRRIGATOR) ×1
IV NS 1000ML (IV SOLUTION) ×1
IV NS 1000ML BAXH (IV SOLUTION) IMPLANT
KIT IMAGING PINPOINTPAQ (MISCELLANEOUS) ×1 IMPLANT
KIT PINK PAD W/HEAD ARE REST (MISCELLANEOUS) ×3
KIT PINK PAD W/HEAD ARM REST (MISCELLANEOUS) ×2 IMPLANT
LABEL OR SOLS (LABEL) IMPLANT
MANIFOLD NEPTUNE II (INSTRUMENTS) ×3 IMPLANT
NEEDLE HYPO 22GX1.5 SAFETY (NEEDLE) ×3 IMPLANT
OBTURATOR OPTICAL STANDARD 8MM (TROCAR) ×1
OBTURATOR OPTICAL STND 8 DVNC (TROCAR) ×2
OBTURATOR OPTICALSTD 8 DVNC (TROCAR) ×2 IMPLANT
PACK COLON CLEAN CLOSURE (MISCELLANEOUS) ×3 IMPLANT
PACK LAP CHOLECYSTECTOMY (MISCELLANEOUS) ×3 IMPLANT
PENCIL ELECTRO HAND CTR (MISCELLANEOUS) ×3 IMPLANT
PORT ACCESS TROCAR AIRSEAL 5 (TROCAR) ×1 IMPLANT
RELOAD STAPLE 60 3.5 BLU DVNC (STAPLE) IMPLANT
RELOAD STAPLE 60 4.3 GRN DVNC (STAPLE) IMPLANT
RELOAD STAPLER 3.5X60 BLU DVNC (STAPLE) IMPLANT
RELOAD STAPLER 4.3X60 GRN DVNC (STAPLE) ×2 IMPLANT
SEAL CANN UNIV 5-8 DVNC XI (MISCELLANEOUS) ×6 IMPLANT
SEAL XI 5MM-8MM UNIVERSAL (MISCELLANEOUS) ×3
SEALER VESSEL DA VINCI XI (MISCELLANEOUS) ×1
SEALER VESSEL EXT DVNC XI (MISCELLANEOUS) IMPLANT
SET TRI-LUMEN FLTR TB AIRSEAL (TUBING) ×3 IMPLANT
SOL PREP PVP 2OZ (MISCELLANEOUS) ×3
SOLUTION ELECTROLUBE (MISCELLANEOUS) ×3 IMPLANT
SOLUTION PREP PVP 2OZ (MISCELLANEOUS) ×2 IMPLANT
SPONGE DRAIN TRACH 4X4 STRL 2S (GAUZE/BANDAGES/DRESSINGS) ×1 IMPLANT
SPONGE T-LAP 18X18 ~~LOC~~+RFID (SPONGE) ×3 IMPLANT
STAPLER 60 DA VINCI SURE FORM (STAPLE) ×1
STAPLER 60 SUREFORM DVNC (STAPLE) IMPLANT
STAPLER CANNULA SEAL DVNC XI (STAPLE) ×2 IMPLANT
STAPLER CANNULA SEAL XI (STAPLE) ×1
STAPLER CIRCULAR MANUAL XL 25 (STAPLE) IMPLANT
STAPLER CIRCULAR MANUAL XL 29 (STAPLE) ×1 IMPLANT
STAPLER CIRCULAR MANUAL XL 33 (STAPLE) IMPLANT
STAPLER RELOAD 3.5X60 BLU DVNC (STAPLE)
STAPLER RELOAD 3.5X60 BLUE (STAPLE)
STAPLER RELOAD 4.3X60 GREEN (STAPLE) ×1
STAPLER RELOAD 4.3X60 GRN DVNC (STAPLE) ×2
STAPLER SKIN PROX 35W (STAPLE) ×1 IMPLANT
SURGILUBE 2OZ TUBE FLIPTOP (MISCELLANEOUS) ×3 IMPLANT
SUT DVC VLOC 90 3-0 CV23 VLT (SUTURE)
SUT ETHILON 3-0 FS-10 30 BLK (SUTURE) ×3
SUT MNCRL AB 4-0 PS2 18 (SUTURE) ×6 IMPLANT
SUT PDS AB 1 CT1 36 (SUTURE) ×6 IMPLANT
SUT SILK 3 0 SH 30 (SUTURE) IMPLANT
SUT VIC AB 3-0 SH 27 (SUTURE) ×1
SUT VIC AB 3-0 SH 27X BRD (SUTURE) ×2 IMPLANT
SUTURE DVC VLC 90 3-0 CV23 VLT (SUTURE) IMPLANT
SUTURE EHLN 3-0 FS-10 30 BLK (SUTURE) IMPLANT
SYR 30ML LL (SYRINGE) ×6 IMPLANT
SYS LAPSCP GELPORT 120MM (MISCELLANEOUS) ×3
SYS TROCAR 1.5-3 SLV ABD GEL (ENDOMECHANICALS) ×3
SYSTEM LAPSCP GELPORT 120MM (MISCELLANEOUS) ×2 IMPLANT
SYSTEM TROCR 1.5-3 SLV ABD GEL (ENDOMECHANICALS) ×2 IMPLANT
SYSTEM WECK SHIELD CLOSURE (TROCAR) IMPLANT
TRAY FOLEY MTR SLVR 16FR STAT (SET/KITS/TRAYS/PACK) ×3 IMPLANT
TROCAR XCEL NON-BLD 5MMX100MML (ENDOMECHANICALS) IMPLANT
TUBING EVAC SMOKE HEATED PNEUM (TUBING) ×1 IMPLANT
WATER STERILE IRR 500ML POUR (IV SOLUTION) ×3 IMPLANT

## 2021-05-17 NOTE — TOC Initial Note (Addendum)
Transition of Care (TOC) - Initial/Assessment Note  ? ? ?Patient Details  ?Name: Paula Moses ?MRN: [id number] ?Date of Birth: March 06, 1966 ? ?Transition of Care (TOC) CM/SW Contact:    ?Beverly Sessions, RN ?Phone Number: ?05/17/2021, 4:18 PM ? ?Clinical Narrative:                 ? ? ?  ?Transition of Care (TOC) Screening Note ? ? ?Patient Details  ?Name: Paula Moses ?Date of Birth: [birth year] ? ? ?Transition of Care (TOC) CM/SW Contact:    ?Beverly Sessions, RN ?Phone Number: ?05/17/2021, 4:19 PM ? ? ? ?Transition of Care Department [facility]) has reviewed patient and no TOC needs have been identified at this time. We will continue to monitor patient advancement through interdisciplinary progression rounds. If new patient transition needs arise, please place a TOC consult. ? ? ?  ? ? ?Patient Goals and CMS Choice ?  ?  ?  ? ?Expected Discharge Plan and Services ?  ?  ?  ?  ?  ?                ?  ?  ?  ?  ?  ?  ?  ?  ?  ?  ? ?Prior Living Arrangements/Services ?  ?  ?  ?       ?  ?  ?  ?  ? ?Activities of Daily Living ?  ?  ? ?Permission Sought/Granted ?  ?  ?   ?   ?   ?   ? ?Emotional Assessment ?  ?  ?  ?  ?  ?  ? ?Admission diagnosis:  Diverticulitis [K57.92] ?Patient Active Problem List  ? Diagnosis Date Noted  ? Diverticulitis 05/17/2021  ? Dyspareunia in female 03/16/2021  ? Breast cancer screening by mammogram 03/16/2021  ? Diverticulosis large intestine w/o perforation or abscess w/o bleeding 12/05/2020  ? GERD without esophagitis 12/05/2020  ? Hospital discharge follow-up 09/26/2020  ? Concentration deficit 06/24/2020  ? Atherosclerosis of iliac artery 06/24/2020  ? Posterior knee pain, right 06/24/2020  ? History of blood in urine 09/04/2019  ? Irritable bowel syndrome 09/04/2019  ? Internal bleeding hemorrhoids 06/20/2019  ? Spinal stenosis of lumbar region without neurogenic claudication 02/28/2019  ? Obesity 02/28/2019  ? History of total right hip replacement 02/25/2019  ? Screening for colon cancer   ?  Rectal polyp   ? Polyp of colon   ? Osteoarthritis of fingers of both hands 12/21/2018  ? Constipation 12/12/2018  ? 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 Unilateral primary osteoarthritis, left hip 01/22/2018  ? Arthritis of left hip 12/20/2017  ? Degenerative disc disease, lumbar 12/20/2017  ? Status post total hip replacement, right 10/26/2017  ? Lumbar spondylosis 02/13/2017  ? Surgical menopause 01/26/2017  ? Status post vaginal hysterectomy BSO 01/26/2017  ? Hypertension   ? High cholesterol   ? Fibroids   ? Trigeminal neuralgia   ? Neck pain on left side 07/16/2013  ? Paresthesia 07/16/2013  ? ?PCP:  [name], MD ?Pharmacy:   ?[facility] ?[place] [address] ?Phone: [phone] Fax: [phone] ? ? ? ? ?Social Determinants of Health (SDOH) Interventions ?  ? ?Readmission Risk Interventions ?No flowsheet data found. ? ? ?

## 2021-05-17 NOTE — Anesthesia Postprocedure Evaluation (Signed)
Anesthesia Post Note ? ?Patient: Paula Moses ? ?Procedure(s) Performed: XI ROBOT ASSISTED SIGMOID COLECTOMY (Abdomen) ?INDOCYANINE GREEN FLUORESCENCE IMAGING (ICG) ? ?Patient location during evaluation: PACU ?Anesthesia Type: General ?Level of consciousness: awake and alert ?Pain management: pain level controlled ?Vital Signs Assessment: post-procedure vital signs reviewed and stable ?Respiratory status: spontaneous breathing, nonlabored ventilation, respiratory function stable and patient connected to nasal cannula oxygen ?Cardiovascular status: blood pressure returned to baseline and stable ?Postop Assessment: no apparent nausea or vomiting ?Anesthetic complications: no ? ? ?No notable events documented. ? ? ?Last Vitals:  ?Vitals:  ? 05/17/21 1415 05/17/21 1448  ?BP: 125/75 120/70  ?Pulse: 90 82  ?Resp: 19 18  ?Temp:  (!) 36.4 ?C  ?SpO2: 96% 100%  ?  ?Last Pain:  ?Vitals:  ? 05/17/21 1400  ?TempSrc:   ?PainSc: 6   ? ? ?  ?  ?  ?  ?  ?  ? ?Martha Clan ? ? ? ? ?

## 2021-05-17 NOTE — Transfer of Care (Addendum)
Immediate Anesthesia Transfer of Care Note ? ?Patient: Paula Moses ? ?Procedure(s) Performed: XI ROBOT ASSISTED SIGMOID COLECTOMY (Abdomen) ?INDOCYANINE GREEN FLUORESCENCE IMAGING (ICG) ? ?Patient Location: PACU ? ?Anesthesia Type:General ? ?Level of Consciousness: drowsy ? ?Airway & Oxygen Therapy: Patient connected to face mask ? ?Post-op Assessment: Report given to RN ? ?Post vital signs: stable ? ?Last Vitals:  ?Vitals Value Taken Time  ?BP    ?Temp    ?Pulse    ?Resp    ?SpO2    ? ? ?Last Pain:  ?Vitals:  ? 05/17/21 0637  ?TempSrc: Temporal  ?PainSc: 0-No pain  ?   ? ?  ? ?Complications: No notable events documented. ?

## 2021-05-17 NOTE — H&P (Signed)
Subjective:  ? ?CC: Diverticulitis of large intestine without perforation or abscess without bleeding [K57.32] ? ?HPI: ?Paula Moses is a 56 y.o. female who returns for evaluation of above. First noted 1 year ago. Recurrent episode about three weeks ago, resolved with abx. Now ready to discuss surgical resection. ? ?Past Medical History: has a past medical history of Depression, GERD (gastroesophageal reflux disease), Hyperlipidemia, Hypertension, Metabolic syndrome, and Primary osteoarthritis of both hips (02/13/2017). ? ?Past Surgical History: has a past surgical history that includes Combined augmentation mammaplasty and abdominoplasty; laparoscopy diagnostic; Cholecystectomy (1997); Hysterectomy Vaginal (2015); Hysterectomy; and Right Total Hip Arthroplasty Anterior Approach 10/26/17. ? ?Family History: family history includes Brain cancer in her mother; Diabetes type II in her father; Heart disease in her father; Pacemaker in her father. ? ?Social History: reports that she has never smoked. She has never used smokeless tobacco. She reports that she does not drink alcohol and does not use drugs. ? ?Current Medications: has a current medication list which includes the following prescription(s): ascorbic acid/vitamin e/biotin, bupropion, dibucaine, estradiol, furosemide, gabapentin, hyaluronic acid, meloxicam, montelukast, multivit,calc,mins/iron/folic, nystatin, polyethylene glycol, progesterone, rizatriptan, and valsartan-hydrochlorothiazide. ? ?Allergies:  ?Allergies  ?Allergen Reactions  ? Aspirin Other (See Comments), Unknown and Shortness Of Breath  ?congestion ?congestion ? ? Hydromorphone Shortness Of Breath  ? Nsaids (Non-Steroidal Anti-Inflammatory Drug) Shortness Of Breath  ? Codeine Itching  ? Percocet [Oxycodone-Acetaminophen] Itching  ? Promethazine Itching  ? Adhes. Band-Tape-Benzalkonium Other (See Comments)  ?Surgical tape caused blisters  ? Fish Oil Other (See Comments)  ?[place]  tightness,sneezing  ? Promethazine Hcl Unknown  ? Tapentadol Rash  ?Surgical tape caused blisters  ? ?ROS:  ?A 15 point review of systems was performed and pertinent positives and negatives noted in HPI ? ?Objective:  ? ? ?BP 128/75  Pulse 62  Ht 165.1 cm ('5\' 5"'$ )  Wt 86.2 kg (190 lb)  BMI 31.62 kg/m?  ? ?Constitutional : alert, appears stated age, cooperative and no distress  ?Lymphatics/Throat: no asymmetry, masses, or scars  ?Respiratory: clear to auscultation bilaterally  ?Cardiovascular: regular rate and rhythm  ?Gastrointestinal: soft, no guarding, minimal TTP in left lower quadrant.Marland Kitchen  ?Musculoskeletal: Steady gait and movement  ?Skin: Cool and moist  ?Psychiatric: Normal affect, non-agitated, not confused  ? ? ?LABS:  ?n/a  ? ?RADS: ?CLINICAL DATA:  Nonlocalized abdominal pain which began 2 days  ?prior, history of diverticulitis  ? ?EXAM:  ?CT ABDOMEN AND PELVIS WITH CONTRAST  ? ?TECHNIQUE:  ?Multidetector CT imaging of the abdomen and pelvis was performed  ?using the standard protocol following bolus administration of  ?intravenous contrast.  ? ?CONTRAST:  168m OMNIPAQUE IOHEXOL 300 MG/ML  SOLN  ? ?COMPARISON:  CT 03/18/2019  ? ?FINDINGS:  ?Lower chest: Bilateral breast prostheses without acute CT evident  ?complication. Lung bases are clear. Normal heart size. No  ?pericardial effusion.  ? ?Hepatobiliary: No worrisome focal liver lesions. Smooth liver  ?surface contour. Normal hepatic attenuation. Prior cholecystectomy.  ?No significant biliary ductal dilatation accounting for post  ?cholecystectomy reservoir effect. No visible intraductal gallstones.  ? ?Pancreas: No pancreatic ductal dilatation or surrounding  ?inflammatory changes.  ? ?Spleen: Normal in size. No concerning splenic lesions.  ? ?Adrenals/Urinary Tract: Normal adrenals. Kidneys enhance and excrete  ?symmetrically. Stable region of cortical scarring in the upper pole  ?left kidney. No worrisome renal mass. No urolithiasis or   ?hydronephrosis. Portion of the pelvis obscured by streak artifact.  ?Slightly asymmetric bladder wall thickening adjacent a region of  ?  left pelvic inflammation is favored to be reactive. No visible  ?bladder calculi or debris.  ? ?Stomach/Bowel: Distal esophagus, stomach and duodenum are  ?unremarkable. No small bowel thickening or dilatation. Noninflamed  ?appendix in the right lower quadrant. Proximal colon is  ?unremarkable. There is however more segmental thickening of the  ?proximal to mid sigmoid with extensive surrounding phlegmon and  ?inflammatory changes much of which appears centered on upon a  ?culprit diverticulum abutting the left pelvic sidewall (5/41).  ?Adjacent reactive free fluid is noted. No clear extraluminal gas is  ?present. No organized abscess or collection. Inflammatory changes  ?extend across the left pelvic sidewall with peritoneal thickening  ?and enhancement.  ? ?Vascular/Lymphatic: Increased vascularity with adjacent reactive  ?appearing lymph nodes in the left lower quadrant in the vicinity of  ?the inflamed sigmoid colon. No other suspicious or pathologically  ?enlarged lymph nodes are seen in the abdomen or pelvis.  ?Atherosclerotic plaque in the abdominal aorta and branch vessels  ?without aneurysm, ectasia or other acute vascular abnormality.  ? ?Reproductive: Post hysterectomy. Inflammatory changes in the left  ?adnexa related to the colon as above.  ? ?Other: Extensive phlegmonous changes and peritoneal thickening  ?adjacent the thickened, inflamed sigmoid colon. Some distribution of  ?the low-attenuation free fluid throughout the abdomen and pelvis. No  ?evidence of organized abscess or collection. No free air. No bowel  ?containing hernia. Portion of the pelvis obscured by streak artifact  ?from bilateral hip prostheses.  ? ?Musculoskeletal: Bilateral total hip arthroplasties without acute  ?hardware failure, periprosthetic fracture or other complication.  ?Resulting streak  artifact. Grade 2 anterolisthesis L5 on S1 with  ?bilateral L5 pars defects. Minimal retrolisthesis L4 on L5 without  ?additional spondylolysis. Multilevel degenerative changes are  ?present in the imaged portions of the spine.  ? ?IMPRESSION:  ?1. Circumferentially thickened sigmoid colon centered in a region of  ?numerous colonic diverticula with a larger inflamed culprit  ?diverticulum along the left pelvic sidewall compatible with an acute  ?diverticulitis. Extensive surrounding phlegmon and features  ?suggestive of adjacent peritonitis albeit without extraluminal gas,  ?organized collection or abscess at this time though please note  ?portions of the pelvis are poorly visualized due to streak artifact  ?from bilateral hip prostheses. This is in a similar location to  ?prior diverticulitis seen on comparison imaging 03/18/2019.  ?2. Bilateral hip arthroplasty without acute complication.  ?3. Chronic left renal scarring.  ?4. Prior cholecystectomy.  ?5.  Aortic Atherosclerosis (ICD10-I70.0).  ? ?Electronically Signed  ?  By: Lovena Le M.D.  ?  On: 09/08/2020 20:17 ? ?Assessment:  ? ? ?Diverticulitis of large intestine without perforation or abscess without bleeding [K57.32] ? ?Plan:  ? ? ?Discussed pathophisiology in depth. The risk of laparoscopic colon resection surgery includes, but not limited to, recurrence, bleeding, chronic pain, post-op infxn, post-op SBO or ileus, hernias, resection of bowel, re-anastamosis, possible ostomy placement and need for re-operation to address said risks. The risks of general anesthetic, if used, includes MI, CVA, sudden death or even reaction to anesthetic medications also discussed. Alternatives include continued observation. Benefits include possible symptom relief, preventing further decline in health and possible death. ? ?Typical post-op recovery time of additional days in hospital for observation afterwards also discussed. ?

## 2021-05-17 NOTE — Op Note (Signed)
Preoperative diagnosis: diverticulitis ?Postoperative diagnosis: Same ? ?Procedure: Robotic assisted laparoscopic sigmoidectomy. ?  ?Anesthesia: GETA ?  ?Surgeon: Benjamine Sprague ?Assistant: Cintron for exposure and bedside assist  ?  ?Wound Classification: clean contaminated ?  ?Specimen: Sigmoid colon ?  ?Complications: None ?  ?Estimated Blood Loss: 50 mL ? ?Indications: Please see H&P for further details.   ?  ?FIndings: ?1.  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  Successful EEA anastomosis with no air leak  ?3.  Normal anatomy ?4.  Adequate hemostasis.  ? ?Description of procedure: The patient was placed on the operating table in the low lithotomy position, both arms tucked. General anesthesia was induced.  Foley placed. A time-out was completed verifying correct patient, procedure, site, positioning, and implant(s) and/or special equipment prior to beginning this procedure. The abdomen was prepped and draped in the usual sterile fashion.  ?  ?RLQ incision from previous abdominoplasty incised and dissection carried down to abdominal cavity, 64m port placed through gelport and gelport placed through incision and insufflation started with no sudden increase in pressure.    Inspection of the abdominal cavity noted no injury to the surrounding area.  Exparel infused as a tap block.   ? ?2 839mports were then placed along the right abdominal wall, the third 30m20meplaced with 32m29mnd then placed through third incision along right side.  The table was placed in the Trendelenburg, right side down position. Xi robotic platform was then brought to the operative field and docked.  Instruments all placed in the typical operative field.  Portion of falciform taken down with vessel sealer to make room for instruments. ?  ?Inspection of the sigmoid colon, noted thickened colon wall and lateral attachments consistent with chronic diverticulitis.  No abscess is noted.  Lateral attachments were  taken down using sharp dissection.  Area distal to the thickened colon chosen for resection and green stapler used to transect. Vessel sealer then used to transect the mesentery from this point proximal to area beyond the thickened wall toward distal descending colon, care to avoid the left ureter. ? ?29 mm EEA stapler anvil was placed through the GelPort, 12 mm port replaced within the port.  ICG was infused and adequate circulation was noted at the planned staple lines. A longitudinal incision was made in the colon with scissors distal to the previously marked proximal transection site to accommodate the anvil.  The tip of the anvil was then placed at the point of the previous marked site and a small hole was made to accommodate the anvil tip through the colon wall.  3-0 V lock was then used to close the initial hole made to insert the anvil within the colon and then a 60 mm green load stapler was used to transect immediately proximal to the closed hole.   ? ?EEA stapler placed through the rectal stump and guided to the distal staple line and a side-to-side anastomosis was created, after confirming no twisting of the proximal mesentery and no tension noted on the staple line.  Saline was infused into the pelvis, and a air leak test was performed and anastomosis did not show any leaks.  All saline was then suctioned out, anastomosis looked viable, and hemostasis was noted throughout the abdomen.  Vistaseal infused around the staple line for additional security.  [place] then pushed through one of 30mm 87mt and placed over the staple line, secured to the skin using 3-0 nylon. The specimen was  removed through the right lower quadrant GelPort site.  Passed off operative field pending pathology. ?  ?Clean closure protocol initiated.  Lower quadrant gelport site closed with 1 PDS x2.  3-0 Vicryl used to approximate the subcutaneous tissue prior to closing all skin sites with 4-0 moncryl, dressed with  dermabond.  JP drain site dressed with drain sponge and paper tape.  The patient tolerated the procedure well, awakened from anesthesia and was taken to the postanesthesia care unit in satisfactory condition with Foley in place.  Sponge count correct at end of procedure. ? ?

## 2021-05-17 NOTE — Anesthesia Preprocedure Evaluation (Signed)
Anesthesia Evaluation  ?Patient identified by MRN, date of birth, ID band ?Patient awake ? ? ? ?Reviewed: ?Allergy & Precautions, H&P , NPO status , Patient's Chart, lab work & pertinent test results ? ?History of Anesthesia Complications ?Negative for: history of anesthetic complications ? ?Airway ?Mallampati: II ? ?TM Distance: >3 FB ?Neck ROM: full ? ? ? Dental ? ?(+) Chipped, Dental Advidsory Given, Caps, Teeth Intact ?Bridge on the top front:   ?Pulmonary ?neg pulmonary ROS, neg shortness of breath,  ?  ?Pulmonary exam normal ? ? ? ? ? ? ? Cardiovascular ?Exercise Tolerance: Good ?hypertension, (-) angina(-) Past MI and (-) Cardiac Stents Normal cardiovascular exam(-) dysrhythmias (-) Valvular Problems/Murmurs ? ? ?  ?Neuro/Psych ? Headaches, neg Seizures Anxiety Migraine today ? Neuromuscular disease   ? GI/Hepatic ?Neg liver ROS, GERD  Medicated and Controlled,  ?Endo/Other  ?negative endocrine ROS ? Renal/GU ?negative Renal ROS  ?negative genitourinary ?  ?Musculoskeletal ? ? Abdominal ?  ?Peds ? Hematology ?negative hematology ROS ?(+)   ?Anesthesia Other Findings ?Past Medical History: ?No date: Abnormal Pap smear of cervix ?No date: Abnormal vaginal bleeding ?No date: Anxiety ?No date: Arthritis ?    Comment:  neck and body ?No date: Complication of anesthesia ?    Comment:  IT TAKES ALOT OF ANESTHESIA TO KEEP PT SEDATED ?No date: Cyst of vulva ?No date: Dyspareunia ?No date: External hemorrhoid ?No date: Fibroids ?No date: GERD (gastroesophageal reflux disease) ?2019: Headache ?    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 ?Past Surgical History: ?2006: AUGMENTATION MAMMAPLASTY; Bilateral ?No date: BREAST ENHANCEMENT  SURGERY ?No date: CHOLECYSTECTOMY ?05/05/2017: HIP ARTHROSCOPY; Right ?    Comment:  Procedure: ARTHROSCOPY HIP;  Surgeon: Leim Fabry, MD;  ?             Location: [facility] ORS;  Service: Orthopedics;  Laterality:  ?             Right; ?No date: LAPAROSCOPY ?    Comment:  X2 ?No date: tummy tuck  ?No date: VAGINAL HYSTERECTOMY ?    Comment:  lahv bso ? ?BMI   ? Body Mass Index:  28.47 kg/m?  ?  ? ? Reproductive/Obstetrics ?negative OB ROS ? ?  ? ? ? ? ? ? ? ? ? ? ? ? ? ?  ?  ? ? ? ? ? ? ? ? ?Anesthesia Physical ? ?Anesthesia Plan ? ?ASA: 3 ? ?Anesthesia Plan: General  ? ?Post-op Pain Management:   ? ?Induction: Intravenous ? ?PONV Risk Score and Plan: 3 and Ondansetron, Dexamethasone, Treatment may vary due to age or medical condition, Midazolam and Aprepitant ? ?Airway Management Planned: Oral ETT ? ?Additional Equipment:  ? ?Intra-op Plan:  ? ?Post-operative Plan: Extubation in OR ? ?Informed Consent: I have reviewed the patients History and Physical, chart, labs and discussed the procedure including the risks, benefits and alternatives for the proposed anesthesia with the patient or authorized representative who has indicated his/her understanding and acceptance.  ? ? ? ?Dental Advisory Given ? ?Plan Discussed with: Anesthesiologist, CRNA and Surgeon ? ?Anesthesia Plan Comments: (Patient reports no bleeding problems and no anticoagulant use. ? ?Plan for spinal with backup GA ? ?Patient consented for risks of anesthesia including but not limited to:  ?-  adverse reactions to medications ?- risk of bleeding, infection, nerve damage and headache ?- risk of failed spinal ?- damage to teeth, lips or other oral mucosa ?- sore throat or hoarseness ?- Damage to heart, brain, lungs or loss of life ? ?Patient voiced understanding.)  ? ? ? ? ? ? ?Anesthesia Quick Evaluation ? ?

## 2021-05-17 NOTE — Anesthesia Procedure Notes (Signed)
Procedure Name: Intubation ?Date/Time: 05/17/2021 8:13 AM ?Performed by: Kerri Perches, CRNA ?Pre-anesthesia Checklist: Patient identified, Patient being monitored, Timeout performed, Emergency Drugs available and Suction available ?Patient Re-evaluated:Patient Re-evaluated prior to induction ?Oxygen Delivery Method: Circle system utilized ?Preoxygenation: Pre-oxygenation with 100% oxygen ?Induction Type: IV induction ?Ventilation: Mask ventilation without difficulty ?Laryngoscope Size: 3 and McGraph ?Grade View: Grade I ?Tube type: Oral ?Tube size: 7.0 mm ?Number of attempts: 1 ?Airway Equipment and Method: Stylet ?Placement Confirmation: ETT inserted through vocal cords under direct vision, positive ETCO2 and breath sounds checked- equal and bilateral ?Secured at: 21 cm ?Tube secured with: Tape ?Dental Injury: Teeth and Oropharynx as per pre-operative assessment  ? ? ? ? ?

## 2021-05-18 LAB — CBC
HCT: 34.9 % — ABNORMAL LOW (ref 36.0–46.0)
Hemoglobin: 12.2 g/dL (ref 12.0–15.0)
MCH: 30.3 pg (ref 26.0–34.0)
MCHC: 35 g/dL (ref 30.0–36.0)
MCV: 86.6 fL (ref 80.0–100.0)
Platelets: 242 10*3/uL (ref 150–400)
RBC: 4.03 MIL/uL (ref 3.87–5.11)
RDW: 13.5 % (ref 11.5–15.5)
WBC: 17 10*3/uL — ABNORMAL HIGH (ref 4.0–10.5)
nRBC: 0 % (ref 0.0–0.2)

## 2021-05-18 LAB — BASIC METABOLIC PANEL
Anion gap: 5 (ref 5–15)
BUN: 11 mg/dL (ref 6–20)
CO2: 28 mmol/L (ref 22–32)
Calcium: 8 mg/dL — ABNORMAL LOW (ref 8.9–10.3)
Chloride: 102 mmol/L (ref 98–111)
Creatinine, Ser: 0.92 mg/dL (ref 0.44–1.00)
GFR, Estimated: >60 mL/min (ref >60)
Glucose, Bld: 107 mg/dL — ABNORMAL HIGH (ref 70–99)
Potassium: 3.3 mmol/L — ABNORMAL LOW (ref 3.5–5.1)
Sodium: 135 mmol/L (ref 135–145)

## 2021-05-18 MED ORDER — POTASSIUM CHLORIDE CRYS ER 20 MEQ PO TBCR
40.0000 meq | EXTENDED_RELEASE_TABLET | Freq: Once | ORAL | Status: AC
  Administered 2021-05-18: 40 meq via ORAL
  Filled 2021-05-18: qty 2

## 2021-05-18 NOTE — Progress Notes (Signed)
Subjective:  ?CC: ?Paula Moses is a 56 y.o. female  Hospital stay day 1, 1 Day Post-Op robotic lap sigmoidectomy for diverticulitis ? ?HPI: ?No acute issues overnight.  Pain present along LLQ.  Had liquid BM this am. ? ?ROS:  ?General: Denies weight loss, weight gain, fatigue, fevers, chills, and night sweats. ?Heart: Denies chest pain, palpitations, racing heart, irregular heartbeat, leg pain or swelling, and decreased activity tolerance. ?Respiratory: Denies breathing difficulty, shortness of breath, wheezing, cough, and sputum. ?GI: Denies change in appetite, heartburn, nausea, vomiting, constipation, diarrhea, and blood in stool. ?GU: Denies difficulty urinating, pain with urinating, urgency, frequency, blood in urine. ? ? ?Objective:  ? ?Temp:  [97.1 ?F (36.2 ?C)-98 ?F (36.7 ?C)] 97.4 ?F (36.3 ?C) (03/07 0802) ?Pulse Rate:  [55-98] 61 (03/07 0802) ?Resp:  [15-19] 18 (03/07 0802) ?BP: (97-128)/(58-75) 97/58 (03/07 0802) ?SpO2:  [94 %-100 %] 98 % (03/07 0802)     Height: '5\' 4"'$  (162.6 cm) Weight: 72.6 kg BMI (Calculated): 27.45  ? ?Intake/Output this shift:  ? ?Intake/Output Summary (Last 24 hours) at 05/18/2021 1237 ?Last data filed at 05/18/2021 0600 ?Gross per 24 hour  ?Intake 1055.69 ml  ?Output 125 ml  ?Net 930.69 ml  ? ? ?Constitutional :  alert, cooperative, appears stated age, and no distress  ?Respiratory:  clear to auscultation bilaterally  ?Cardiovascular:  regular rate and rhythm  ?Gastrointestinal: Soft, no guarding, no distention TTP along LLQ as expected.  JP with serosanguinous fluid .   ?Skin: Cool and moist. Incisions c/d/i  ?Psychiatric: Normal affect, non-agitated, not confused  ?   ?  ?LABS:  ?CMP Latest Ref Rng & Units 05/18/2021 05/17/2021 05/13/2021  ?Glucose 70 - 99 mg/dL 107(H) - 93  ?BUN 6 - 20 mg/dL 11 - 11  ?Creatinine 0.44 - 1.00 mg/dL 0.92 1.10(H) 0.95  ?Sodium 135 - 145 mmol/L 135 - 138  ?Potassium 3.5 - 5.1 mmol/L 3.3(L) - 3.7  ?Chloride 98 - 111 mmol/L 102 - 106  ?CO2 22 - 32 mmol/L 28 -  26  ?Calcium 8.9 - 10.3 mg/dL 8.0(L) - 9.3  ?Total Protein 6.0 - 8.3 g/dL - - -  ?Total Bilirubin 0.2 - 1.2 mg/dL - - -  ?Alkaline Phos 39 - 117 U/L - - -  ?AST 0 - 37 U/L - - -  ?ALT 0 - 35 U/L - - -  ? ?CBC Latest Ref Rng & Units 05/18/2021 05/17/2021 05/13/2021  ?WBC 4.0 - 10.5 K/uL 17.0(H) 18.9(H) 6.4  ?Hemoglobin 12.0 - 15.0 g/dL 12.2 14.3 14.6  ?Hematocrit 36.0 - 46.0 % 34.9(L) 41.2 42.3  ?Platelets 150 - 400 K/uL 242 278 285  ? ? ?RADS: ?N/a ?Assessment:  ? ?S/p robotic assisted laparoscopic sigmoidectomy. ? ?As expected at this point.  Foley out, start clears.  Monitor JP drain and hgb, pain level. ? ?Lovenox/protonix for DVT/GI prophylaxis ? ?labs/images/medications/previous chart entries reviewed personally and relevant changes/updates noted above. ? ? ?

## 2021-05-18 NOTE — Plan of Care (Signed)
Pt alert and oriented x 4. Pt has received 3 doses of morphine this shift and 1 dose of Toradol. Pt ambulated 50 ft in the hallway this am and had bm smear and passing gas this am after ambulating. Pt used incentive spirometer and achieved 2500.  ?Problem: Education: ?Goal: Required Educational Video(s) ?Outcome: Progressing ?  ?Problem: Clinical Measurements: ?Goal: Postoperative complications will be avoided or minimized ?Outcome: Progressing ?  ?Problem: Skin Integrity: ?Goal: Demonstration of wound healing without infection will improve ?Outcome: Progressing ?  ?Problem: Education: ?Goal: Knowledge of General Education information will improve ?Description: Including pain rating scale, medication(s)/side effects and non-pharmacologic comfort measures ?Outcome: Progressing ?  ?Problem: Health Behavior/Discharge Planning: ?Goal: Ability to manage health-related needs will improve ?Outcome: Progressing ?  ?Problem: Clinical Measurements: ?Goal: Ability to maintain clinical measurements within normal limits will improve ?Outcome: Progressing ?Goal: Will remain free from infection ?Outcome: Progressing ?Goal: Diagnostic test results will improve ?Outcome: Progressing ?Goal: Respiratory complications will improve ?Outcome: Progressing ?Goal: Cardiovascular complication will be avoided ?Outcome: Progressing ?  ?Problem: Activity: ?Goal: Risk for activity intolerance will decrease ?Outcome: Progressing ?  ?Problem: Nutrition: ?Goal: Adequate nutrition will be maintained ?Outcome: Progressing ?  ?Problem: Elimination: ?Goal: Will not experience complications related to bowel motility ?Outcome: Progressing ?Goal: Will not experience complications related to urinary retention ?Outcome: Progressing ?  ?Problem: Pain Managment: ?Goal: General experience of comfort will improve ?Outcome: Progressing ?  ?Problem: Safety: ?Goal: Ability to remain free from injury will improve ?Outcome: Progressing ?  ?Problem: Skin  Integrity: ?Goal: Risk for impaired skin integrity will decrease ?Outcome: Progressing ?  ?

## 2021-05-19 LAB — BASIC METABOLIC PANEL
Anion gap: 4 — ABNORMAL LOW (ref 5–15)
BUN: 9 mg/dL (ref 6–20)
CO2: 27 mmol/L (ref 22–32)
Calcium: 7.9 mg/dL — ABNORMAL LOW (ref 8.9–10.3)
Chloride: 103 mmol/L (ref 98–111)
Creatinine, Ser: 0.89 mg/dL (ref 0.44–1.00)
GFR, Estimated: >60 mL/min (ref >60)
Glucose, Bld: 95 mg/dL (ref 70–99)
Potassium: 3.7 mmol/L (ref 3.5–5.1)
Sodium: 134 mmol/L — ABNORMAL LOW (ref 135–145)

## 2021-05-19 LAB — CBC
HCT: 32.5 % — ABNORMAL LOW (ref 36.0–46.0)
Hemoglobin: 10.9 g/dL — ABNORMAL LOW (ref 12.0–15.0)
MCH: 29.8 pg (ref 26.0–34.0)
MCHC: 33.5 g/dL (ref 30.0–36.0)
MCV: 88.8 fL (ref 80.0–100.0)
Platelets: 206 10*3/uL (ref 150–400)
RBC: 3.66 MIL/uL — ABNORMAL LOW (ref 3.87–5.11)
RDW: 13.8 % (ref 11.5–15.5)
WBC: 9.5 10*3/uL (ref 4.0–10.5)
nRBC: 0 % (ref 0.0–0.2)

## 2021-05-19 LAB — HEMOGLOBIN AND HEMATOCRIT, BLOOD
HCT: 33.9 % — ABNORMAL LOW (ref 36.0–46.0)
Hemoglobin: 11.7 g/dL — ABNORMAL LOW (ref 12.0–15.0)

## 2021-05-19 LAB — SURGICAL PATHOLOGY

## 2021-05-19 MED ORDER — CELECOXIB 200 MG PO CAPS
200.0000 mg | ORAL_CAPSULE | Freq: Two times a day (BID) | ORAL | Status: DC
  Administered 2021-05-19 – 2021-05-21 (×5): 200 mg via ORAL
  Filled 2021-05-19 (×6): qty 1

## 2021-05-19 MED ORDER — HYDROCODONE-ACETAMINOPHEN 5-325 MG PO TABS
1.0000 | ORAL_TABLET | Freq: Four times a day (QID) | ORAL | Status: DC | PRN
  Administered 2021-05-19 – 2021-05-20 (×6): 2 via ORAL
  Administered 2021-05-21: 1 via ORAL
  Filled 2021-05-19: qty 2
  Filled 2021-05-19: qty 1
  Filled 2021-05-19 (×2): qty 2
  Filled 2021-05-19: qty 1
  Filled 2021-05-19 (×2): qty 2
  Filled 2021-05-19: qty 1

## 2021-05-19 NOTE — Progress Notes (Signed)
Subjective:  ?CC: ?Paula Moses is a 56 y.o. female  Hospital stay day 2, 2 Days Post-Op robotic lap sigmoidectomy for diverticulitis ? ?HPI: ?No acute issues overnight.  Continues to have LLQ pain and liquid stool ? ?ROS:  ?General: Denies weight loss, weight gain, fatigue, fevers, chills, and night sweats. ?Heart: Denies chest pain, palpitations, racing heart, irregular heartbeat, leg pain or swelling, and decreased activity tolerance. ?Respiratory: Denies breathing difficulty, shortness of breath, wheezing, cough, and sputum. ?GI: Denies change in appetite, heartburn, nausea, vomiting, constipation, diarrhea, and blood in stool. ?GU: Denies difficulty urinating, pain with urinating, urgency, frequency, blood in urine. ? ? ?Objective:  ? ?Temp:  [97.7 ?F (36.5 ?C)-98.1 ?F (36.7 ?C)] 98.1 ?F (36.7 ?C) (03/08 0732) ?Pulse Rate:  [56-64] 62 (03/08 0732) ?Resp:  [16-18] 18 (03/08 0732) ?BP: (102-122)/(60-72) 107/69 (03/08 0732) ?SpO2:  [97 %-100 %] 99 % (03/08 0732)     Height: '5\' 4"'$  (162.6 cm) Weight: 72.6 kg BMI (Calculated): 27.45  ? ?Intake/Output this shift:  ? ?Intake/Output Summary (Last 24 hours) at 05/19/2021 1238 ?Last data filed at 05/19/2021 6553 ?Gross per 24 hour  ?Intake 1070 ml  ?Output 185 ml  ?Net 885 ml  ? ? ?Constitutional :  alert, cooperative, appears stated age, and no distress  ?Respiratory:  clear to auscultation bilaterally  ?Cardiovascular:  regular rate and rhythm  ?Gastrointestinal: Soft, no guarding, no distention TTP along LLQ as expected.  JP with serous fluid now .   ?Skin: Cool and moist. Incisions c/d/i  ?Psychiatric: Normal affect, non-agitated, not confused  ?   ?  ?LABS:  ?CMP Latest Ref Rng & Units 05/19/2021 05/18/2021 05/13/2021  ?Glucose 70 - 99 mg/dL 95 107(H) 93  ?BUN 6 - 20 mg/dL '9 11 11  '$ ?Creatinine 0.44 - 1.00 mg/dL 0.89 0.92 0.95  ?Sodium 135 - 145 mmol/L 134(L) 135 138  ?Potassium 3.5 - 5.1 mmol/L 3.7 3.3(L) 3.7  ?Chloride 98 - 111 mmol/L 103 102 106  ?CO2 22 - 32 mmol/L '27 28  26  '$ ?Calcium 8.9 - 10.3 mg/dL 7.9(L) 8.0(L) 9.3  ?Total Protein 6.0 - 8.3 g/dL - - -  ?Total Bilirubin 0.2 - 1.2 mg/dL - - -  ?Alkaline Phos 39 - 117 U/L - - -  ?AST 0 - 37 U/L - - -  ?ALT 0 - 35 U/L - - -  ? ?CBC Latest Ref Rng & Units 05/19/2021 05/18/2021 05/13/2021  ?WBC 4.0 - 10.5 K/uL 9.5 17.0(H) 6.4  ?Hemoglobin 12.0 - 15.0 g/dL 10.9(L) 12.2 14.6  ?Hematocrit 36.0 - 46.0 % 32.5(L) 34.9(L) 42.3  ?Platelets 150 - 400 K/uL 206 242 285  ? ? ?RADS: ?N/a ?Assessment:  ? ?S/p robotic assisted laparoscopic sigmoidectomy. ? ?Tolerated clears with continued BM.  Advance to full liquid ? ?Wbc normalized, hgb drop noted.  Will recheck this pm, but likely no active bleed at this time since JP serous now.  Hold lovenox and toradol until hgb results back. Celebrex in the meantime. ? ?protonix for GI prophylaxis ? ?labs/images/medications/previous chart entries reviewed personally and relevant changes/updates noted above. ? ? ?

## 2021-05-19 NOTE — Plan of Care (Signed)
?  Problem: Education: ?Goal: Required Educational Video(s) ?05/19/2021 0529 by Jordan Likes, RN ?Outcome: Progressing ?05/18/2021 2215 by Jordan Likes, RN ?Outcome: Progressing ?  ?Problem: Clinical Measurements: ?Goal: Postoperative complications will be avoided or minimized ?05/19/2021 0529 by Jordan Likes, RN ?Outcome: Progressing ?05/18/2021 2215 by Jordan Likes, RN ?Outcome: Progressing ?  ?Problem: Skin Integrity: ?Goal: Demonstration of wound healing without infection will improve ?05/19/2021 0529 by Jordan Likes, RN ?Outcome: Progressing ?05/18/2021 2215 by Jordan Likes, RN ?Outcome: Progressing ?  ?Problem: Education: ?Goal: Knowledge of General Education information will improve ?Description: Including pain rating scale, medication(s)/side effects and non-pharmacologic comfort measures ?05/19/2021 0529 by Jordan Likes, RN ?Outcome: Progressing ?05/18/2021 2215 by Jordan Likes, RN ?Outcome: Progressing ?  ?Problem: Health Behavior/Discharge Planning: ?Goal: Ability to manage health-related needs will improve ?05/19/2021 0529 by Jordan Likes, RN ?Outcome: Progressing ?05/18/2021 2215 by Jordan Likes, RN ?Outcome: Progressing ?  ?Problem: Clinical Measurements: ?Goal: Ability to maintain clinical measurements within normal limits will improve ?05/19/2021 0529 by Jordan Likes, RN ?Outcome: Progressing ?05/18/2021 2215 by Jordan Likes, RN ?Outcome: Progressing ?Goal: Will remain free from infection ?05/19/2021 0529 by Jordan Likes, RN ?Outcome: Progressing ?05/18/2021 2215 by Jordan Likes, RN ?Outcome: Progressing ?Goal: Diagnostic test results will improve ?05/19/2021 0529 by Jordan Likes, RN ?Outcome: Progressing ?05/18/2021 2215 by Jordan Likes, RN ?Outcome: Progressing ?Goal: Respiratory complications will improve ?05/19/2021 0529 by Jordan Likes, RN ?Outcome: Progressing ?05/18/2021 2215 by Jordan Likes, RN ?Outcome: Progressing ?Goal:  Cardiovascular complication will be avoided ?05/19/2021 0529 by Jordan Likes, RN ?Outcome: Progressing ?05/18/2021 2215 by Jordan Likes, RN ?Outcome: Progressing ?  ?Problem: Activity: ?Goal: Risk for activity intolerance will decrease ?05/19/2021 0529 by Jordan Likes, RN ?Outcome: Progressing ?05/18/2021 2215 by Jordan Likes, RN ?Outcome: Progressing ?  ?Problem: Nutrition: ?Goal: Adequate nutrition will be maintained ?05/19/2021 0529 by Jordan Likes, RN ?Outcome: Progressing ?05/18/2021 2215 by Jordan Likes, RN ?Outcome: Progressing ?  ?Problem: Elimination: ?Goal: Will not experience complications related to bowel motility ?05/19/2021 0529 by Jordan Likes, RN ?Outcome: Progressing ?05/18/2021 2215 by Jordan Likes, RN ?Outcome: Progressing ?Goal: Will not experience complications related to urinary retention ?05/19/2021 0529 by Jordan Likes, RN ?Outcome: Progressing ?05/18/2021 2215 by Jordan Likes, RN ?Outcome: Progressing ?  ?Problem: Pain Managment: ?Goal: General experience of comfort will improve ?05/19/2021 0529 by Jordan Likes, RN ?Outcome: Progressing ?05/18/2021 2215 by Jordan Likes, RN ?Outcome: Progressing ?  ?Problem: Safety: ?Goal: Ability to remain free from injury will improve ?05/19/2021 0529 by Jordan Likes, RN ?Outcome: Progressing ?05/18/2021 2215 by Jordan Likes, RN ?Outcome: Progressing ?  ?Problem: Skin Integrity: ?Goal: Risk for impaired skin integrity will decrease ?05/19/2021 0529 by Jordan Likes, RN ?Outcome: Progressing ?05/18/2021 2215 by Jordan Likes, RN ?Outcome: Progressing ?  ?

## 2021-05-20 LAB — BASIC METABOLIC PANEL
Anion gap: 6 (ref 5–15)
BUN: 7 mg/dL (ref 6–20)
CO2: 27 mmol/L (ref 22–32)
Calcium: 8.2 mg/dL — ABNORMAL LOW (ref 8.9–10.3)
Chloride: 103 mmol/L (ref 98–111)
Creatinine, Ser: 0.82 mg/dL (ref 0.44–1.00)
GFR, Estimated: >60 mL/min (ref >60)
Glucose, Bld: 89 mg/dL (ref 70–99)
Potassium: 4.1 mmol/L (ref 3.5–5.1)
Sodium: 136 mmol/L (ref 135–145)

## 2021-05-20 LAB — CBC
HCT: 33.7 % — ABNORMAL LOW (ref 36.0–46.0)
Hemoglobin: 11.4 g/dL — ABNORMAL LOW (ref 12.0–15.0)
MCH: 29.9 pg (ref 26.0–34.0)
MCHC: 33.8 g/dL (ref 30.0–36.0)
MCV: 88.5 fL (ref 80.0–100.0)
Platelets: 209 10*3/uL (ref 150–400)
RBC: 3.81 MIL/uL — ABNORMAL LOW (ref 3.87–5.11)
RDW: 13.5 % (ref 11.5–15.5)
WBC: 8.4 10*3/uL (ref 4.0–10.5)
nRBC: 0 % (ref 0.0–0.2)

## 2021-05-20 MED ORDER — ENOXAPARIN SODIUM 40 MG/0.4ML IJ SOSY
40.0000 mg | PREFILLED_SYRINGE | INTRAMUSCULAR | Status: DC
  Administered 2021-05-20 – 2021-05-21 (×2): 40 mg via SUBCUTANEOUS
  Filled 2021-05-20 (×2): qty 0.4

## 2021-05-20 MED ORDER — DIPHENHYDRAMINE HCL 25 MG PO CAPS
25.0000 mg | ORAL_CAPSULE | Freq: Four times a day (QID) | ORAL | Status: DC | PRN
  Filled 2021-05-20: qty 1

## 2021-05-20 NOTE — Progress Notes (Signed)
Subjective:  ?CC: ?Paula Moses is a 56 y.o. female  Hospital stay day 3, 3 Days Post-Op robotic lap sigmoidectomy for diverticulitis ? ?HPI: ?No acute issues overnight.  Tolerated full liquid.  Waiting for a BM since starting full liquid ? ?ROS:  ?General: Denies weight loss, weight gain, fatigue, fevers, chills, and night sweats. ?Heart: Denies chest pain, palpitations, racing heart, irregular heartbeat, leg pain or swelling, and decreased activity tolerance. ?Respiratory: Denies breathing difficulty, shortness of breath, wheezing, cough, and sputum. ?GI: Denies change in appetite, heartburn, nausea, vomiting, constipation, diarrhea, and blood in stool. ?GU: Denies difficulty urinating, pain with urinating, urgency, frequency, blood in urine. ? ? ?Objective:  ? ?Temp:  [97.5 ?F (36.4 ?C)-97.8 ?F (36.6 ?C)] 97.5 ?F (36.4 ?C) (03/09 0755) ?Pulse Rate:  [53-56] 56 (03/09 0755) ?Resp:  [16-20] 18 (03/09 0755) ?BP: (106-143)/(65-83) 143/71 (03/09 0755) ?SpO2:  [98 %-100 %] 100 % (03/09 0755)     Height: '5\' 4"'$  (162.6 cm) Weight: 72.6 kg BMI (Calculated): 27.45  ? ?Intake/Output this shift:  ? ?Intake/Output Summary (Last 24 hours) at 05/20/2021 1013 ?Last data filed at 05/20/2021 1001 ?Gross per 24 hour  ?Intake --  ?Output 170 ml  ?Net -170 ml  ? ? ?Constitutional :  alert, cooperative, appears stated age, and no distress  ?Respiratory:  clear to auscultation bilaterally  ?Cardiovascular:  regular rate and rhythm  ?Gastrointestinal: Soft, no guarding, no distention TTP along LLQ as expected.  JP with continued serous fluid now .   ?Skin: Cool and moist. Incisions c/d/i  ?Psychiatric: Normal affect, non-agitated, not confused  ?   ?  ?LABS:  ?CMP Latest Ref Rng & Units 05/20/2021 05/19/2021 05/18/2021  ?Glucose 70 - 99 mg/dL 89 95 107(H)  ?BUN 6 - 20 mg/dL '7 9 11  '$ ?Creatinine 0.44 - 1.00 mg/dL 0.82 0.89 0.92  ?Sodium 135 - 145 mmol/L 136 134(L) 135  ?Potassium 3.5 - 5.1 mmol/L 4.1 3.7 3.3(L)  ?Chloride 98 - 111 mmol/L 103 103  102  ?CO2 22 - 32 mmol/L '27 27 28  '$ ?Calcium 8.9 - 10.3 mg/dL 8.2(L) 7.9(L) 8.0(L)  ?Total Protein 6.0 - 8.3 g/dL - - -  ?Total Bilirubin 0.2 - 1.2 mg/dL - - -  ?Alkaline Phos 39 - 117 U/L - - -  ?AST 0 - 37 U/L - - -  ?ALT 0 - 35 U/L - - -  ? ?CBC Latest Ref Rng & Units 05/20/2021 05/19/2021 05/19/2021  ?WBC 4.0 - 10.5 K/uL 8.4 - 9.5  ?Hemoglobin 12.0 - 15.0 g/dL 11.4(L) 11.7(L) 10.9(L)  ?Hematocrit 36.0 - 46.0 % 33.7(L) 33.9(L) 32.5(L)  ?Platelets 150 - 400 K/uL 209 - 206  ? ? ?RADS: ?N/a ?Assessment:  ? ?S/p robotic assisted laparoscopic sigmoidectomy. ? ?Tolerated full liquid advance to regular.  Likely d/c tomorrow once patient is more comfortable ? ? ?Hemoglobin stabilized.  Resume lovenox. protonix for GI prophylaxis ? ?labs/images/medications/previous chart entries reviewed personally and relevant changes/updates noted above. ? ? ?

## 2021-05-20 NOTE — Plan of Care (Signed)
?  Problem: Education: ?Goal: Required Educational Video(s) ?Outcome: Progressing ?  ?Problem: Clinical Measurements: ?Goal: Postoperative complications will be avoided or minimized ?Outcome: Progressing ?  ?Problem: Skin Integrity: ?Goal: Demonstration of wound healing without infection will improve ?Outcome: Progressing ?  ?Problem: Education: ?Goal: Knowledge of General Education information will improve ?Description: Including pain rating scale, medication(s)/side effects and non-pharmacologic comfort measures ?Outcome: Progressing ?  ?Problem: Health Behavior/Discharge Planning: ?Goal: Ability to manage health-related needs will improve ?Outcome: Progressing ?  ?Problem: Clinical Measurements: ?Goal: Ability to maintain clinical measurements within normal limits will improve ?Outcome: Progressing ?Goal: Will remain free from infection ?Outcome: Progressing ?Goal: Diagnostic test results will improve ?Outcome: Progressing ?Goal: Respiratory complications will improve ?Outcome: Progressing ?Goal: Cardiovascular complication will be avoided ?Outcome: Progressing ?  ?Problem: Activity: ?Goal: Risk for activity intolerance will decrease ?Outcome: Progressing ?  ?Problem: Nutrition: ?Goal: Adequate nutrition will be maintained ?Outcome: Progressing ?  ?Problem: Elimination: ?Goal: Will not experience complications related to bowel motility ?Outcome: Progressing ?Goal: Will not experience complications related to urinary retention ?Outcome: Progressing ?  ?Problem: Pain Managment: ?Goal: General experience of comfort will improve ?Outcome: Progressing ?  ?Problem: Safety: ?Goal: Ability to remain free from injury will improve ?Outcome: Progressing ?  ?Problem: Skin Integrity: ?Goal: Risk for impaired skin integrity will decrease ?Outcome: Progressing ?  ?

## 2021-05-21 MED ORDER — DICYCLOMINE HCL 10 MG PO CAPS: 10.0000 mg | ORAL_CAPSULE | Freq: Four times a day (QID) | ORAL | 0 refills | Status: DC | PRN

## 2021-05-21 MED ORDER — DOCUSATE SODIUM 100 MG PO CAPS: 100.0000 mg | ORAL_CAPSULE | Freq: Two times a day (BID) | ORAL | 0 refills | Status: AC | PRN

## 2021-05-21 MED ORDER — HYDROCODONE-ACETAMINOPHEN 5-325 MG PO TABS: 1.0000 | ORAL_TABLET | Freq: Four times a day (QID) | ORAL | 0 refills | Status: DC | PRN

## 2021-05-21 MED ORDER — ACETAMINOPHEN 325 MG PO TABS: 650.0000 mg | ORAL_TABLET | Freq: Three times a day (TID) | ORAL | 0 refills | Status: AC | PRN

## 2021-05-21 NOTE — Discharge Summary (Signed)
Physician Discharge Summary  ?Patient ID: ?Paula Moses ?MRN: [id number] ?DOB/AGE: 1966-01-14 56 y.o. ? ?Admit date: 05/17/2021 ?Discharge date: 05/21/21 ? ?Admission Diagnoses: diverticulitis ? ?Discharge Diagnoses:  ?Same as above ? ?Discharged Condition: good ? ?Hospital Course: admitted for above. Underwent surgery.  Please see op note for details.  Post op, recovered as expected.  At time of d/c, tolerating diet and pain controlled ? ?Consults: None ? ?Discharge Exam: ?Blood pressure 127/80, pulse (!) 54, temperature 98.1 ?F (36.7 ?C), resp. rate 18, height '5\' 4"'$  (1.626 m), weight 72.6 kg, SpO2 99 %. ?General appearance: alert, cooperative, and no distress ?GI: soft, no guarding, TTP along LLQ and around incisions as expected. Drain with serous output.   ? ?Disposition:  ?Discharge disposition: 01-Home or Self Care ? ? ? ? ? ? ?Discharge Instructions   ? ? Discharge patient   Complete by: As directed ?  ? Discharge disposition: 01-Home or Self Care  ? Discharge patient date: 05/21/2021  ? ?  ? ?Allergies as of 05/21/2021   ? ?   Reactions  ? Aspirin Shortness Of Breath, Other (See Comments)  ? congestion  ? Dilaudid [hydromorphone] Shortness Of Breath  ? Codeine Itching, Other (See Comments)  ? Facial itching   ? Percocet [oxycodone-acetaminophen] Itching, Other (See Comments)  ? Facial itching   ? Phenergan [promethazine Hcl] Itching, Other (See Comments)  ? Facial itching   ? Tape Other (See Comments)  ? Surgical tape caused blisters  ? Tramadol   ? Nausea   ? ?  ? ?  ?Medication List  ?  ? ?STOP taking these medications   ? ?Azelastine HCl 137 MCG/SPRAY Soln ?  ?dibucaine 1 % Oint ?Commonly known as: NUPERCAINAL ?  ?fluticasone 50 MCG/ACT nasal spray ?Commonly known as: FLONASE ?  ?furosemide 20 MG tablet ?Commonly known as: LASIX ?  ?valsartan 80 MG tablet ?Commonly known as: DIOVAN ?  ? ?  ? ?TAKE these medications   ? ?acetaminophen 325 MG tablet ?Commonly known as: Tylenol ?Take 2 tablets (650 mg total) by  mouth every 8 (eight) hours as needed for mild pain. ?  ?buPROPion 300 MG 24 hr tablet ?Commonly known as: Wellbutrin XL ?Take 1 tablet (300 mg total) by mouth daily. ?  ?dicyclomine 10 MG capsule ?Commonly known as: Bentyl ?Take 1 capsule (10 mg total) by mouth 4 (four) times daily as needed for up to 7 days for spasms (Abdominal cramps). ?  ?docusate sodium 100 MG capsule ?Commonly known as: Colace ?Take 1 capsule (100 mg total) by mouth 2 (two) times daily as needed for up to 10 days for mild constipation. ?  ?hydrochlorothiazide 25 MG tablet ?Commonly known as: HYDRODIURIL ?Take 1 tablet (25 mg total) by mouth daily. ?  ?HYDROcodone-acetaminophen 5-325 MG tablet ?Commonly known as: Norco ?Take 1 tablet by mouth every 6 (six) hours as needed for up to 15 doses for moderate pain. ?  ?meloxicam 7.5 MG tablet ?Commonly known as: MOBIC ?Take 1 tablet by mouth once daily ?  ?montelukast 10 MG tablet ?Commonly known as: SINGULAIR ?Take 10 mg by mouth at bedtime. ?  ?omeprazole 40 MG capsule ?Commonly known as: PRILOSEC ?40 mg daily as needed (Heartburn). ?  ?OVER THE COUNTER MEDICATION ?Take 1 tablet by mouth daily as needed (Weight loss). renew life ?  ?polyethylene glycol powder 17 GM/SCOOP powder ?Commonly known as: GLYCOLAX/MIRALAX ?Take 0.5 Containers by mouth daily as needed for moderate constipation or mild constipation. ?  ?PRESCRIPTION MEDICATION ?  Testosterone and estrogen pellet implants in buttocks ?  ?progesterone 100 MG capsule ?Commonly known as: PROMETRIUM ?Take 100 mg by mouth at bedtime. Sonia compounding (K 100 )++ ?  ?tirzepatide 12.5 MG/0.5ML Pen ?Commonly known as: MOUNJARO ?Inject 12.5 mg into the skin once a week. ?  ? ?  ? ? Follow-up Information   ? ? Lysle Pearl, Kenard Morawski, DO Follow up on 05/31/2021.   ?Specialty: Surgery ?Why: post op colon resection, drain removal 8:15 ?Contact information: ?[address] ?[place] [address] ?[phone] ? ? ?  ?  ? ?  ?  ? ?  ? ? ? ?Total time spent arranging  discharge was >41mn. ?Signed: ?IBenjamine Sprague?05/21/2021, 1:10 PM ? ?

## 2021-05-21 NOTE — Discharge Instructions (Addendum)
Laparoscopic Colectomy, Care After ?This sheet gives you information about how to care for yourself after your procedure. Your health care provider may also give you more specific instructions. If you have problems or questions, contact your health care provider. ?What can I expect after the procedure? ?After your procedure, it is common to have the following: ?Pain in your abdomen, especially in the incision areas. You will be given medicine to control the pain. ?Tiredness. This is a normal part of the recovery process. Your energy level will return to normal over the next several weeks. ?Changes in your bowel movements, such as constipation or needing to go more often. Talk with your health care provider about how to manage this. ?Follow these instructions at home: ?Medicines ? tylenol as needed for discomfort.   Use narcotics, if prescribed, only when tylenol and motrin is not enough to control pain. ? 325-'650mg'$  every 8hrs to max of '4000mg'$ /24hrs (including the '325mg'$  in every norco dose) for the tylenol.    ?Do not drive or use heavy machinery while taking prescription pain medicine. ?Do not drink alcohol while taking prescription pain medicine. ?If you were prescribed an antibiotic medicine, use it as told by your health care provider. Do not stop using the antibiotic even if you start to feel better. ?Incision care ? ?  ?Follow instructions from your health care provider about how to take care of your incision areas. Make sure you: ?Keep your incisions clean and dry. ?Wash your hands with soap and water before and after applying medicine to the areas, and before and after changing your bandage (dressing). If soap and water are not available, use hand sanitizer. ?Change your dressing as told by your health care provider. ?Leave stitches (sutures), skin glue, or adhesive strips in place. These skin closures may need to stay in place for 2 weeks or longer. If adhesive strip edges start to loosen and curl up, you  may trim the loose edges. Do not remove adhesive strips completely unless your health care provider tells you to do that. ?Do not wear tight clothing over the incisions. Tight clothing may rub and irritate the incision areas, which may cause the incisions to open. ?Do not take baths, swim, or use a hot tub until your health care provider approves. OK TO SHOWER.   ?Check your incision area every day for signs of infection. Check for: ?More redness, swelling, or pain. ?More fluid or blood. ?Warmth. ?Pus or a bad smell. ?Activity ?Avoid lifting anything that is heavier than 10 lb (4.5 kg) for 2 weeks or until your health care provider says it is okay. ?You may resume normal activities as told by your health care provider. Ask your health care provider what activities are safe for you. ?Take rest breaks during the day as needed. ?Eating and drinking ?Follow instructions from your health care provider about what you can eat after surgery. ?To prevent or treat constipation while you are taking prescription pain medicine, your health care provider may recommend that you: ?Drink enough fluid to keep your urine clear or pale yellow. ?Take over-the-counter or prescription medicines. ?Eat foods that are high in fiber, such as fresh fruits and vegetables, whole grains, and beans. ?Limit foods that are high in fat and processed sugars, such as fried and sweet foods. ?General instructions ?Ask your health care provider when you will need an appointment to get your sutures or staples removed. ?Keep all follow-up visits as told by your health care provider. This is important. ?  Contact a health care provider if: ?You have more redness, swelling, or pain around your incisions. ?You have more fluid or blood coming from the incisions. ?Your incisions feel warm to the touch. ?You have pus or a bad smell coming from your incisions or your dressing. ?You have a fever. ?You have an incision that breaks open (edges not staying together)  after sutures or staples have been removed. ?Get help right away if: ?You develop a rash. ?You have chest pain or difficulty breathing. ?You have pain or swelling in your legs. ?You feel light-headed or you faint. ?Your abdomen swells (becomes distended). ?You have nausea or vomiting. ?You have blood in your stool (feces). ?This information is not intended to replace advice given to you by your health care provider. Make sure you discuss any questions you have with your health care provider. ?Document Released: 09/17/2004 Document Revised: 11/17/2017 Document Reviewed: 11/30/2015 ?Elsevier Interactive Patient Education ? 2019 [place]. ?  ? ?

## 2021-05-21 NOTE — Plan of Care (Signed)
?  Problem: Education: ?Goal: Required Educational Video(s) ?Outcome: Progressing ?  ?Problem: Clinical Measurements: ?Goal: Postoperative complications will be avoided or minimized ?Outcome: Progressing ?  ?Problem: Skin Integrity: ?Goal: Demonstration of wound healing without infection will improve ?Outcome: Progressing ?  ?Problem: Education: ?Goal: Knowledge of General Education information will improve ?Description: Including pain rating scale, medication(s)/side effects and non-pharmacologic comfort measures ?Outcome: Progressing ?  ?Problem: Health Behavior/Discharge Planning: ?Goal: Ability to manage health-related needs will improve ?Outcome: Progressing ?  ?Problem: Clinical Measurements: ?Goal: Ability to maintain clinical measurements within normal limits will improve ?Outcome: Progressing ?Goal: Will remain free from infection ?Outcome: Progressing ?Goal: Diagnostic test results will improve ?Outcome: Progressing ?Goal: Respiratory complications will improve ?Outcome: Progressing ?Goal: Cardiovascular complication will be avoided ?Outcome: Progressing ?  ?Problem: Activity: ?Goal: Risk for activity intolerance will decrease ?Outcome: Progressing ?  ?Problem: Nutrition: ?Goal: Adequate nutrition will be maintained ?Outcome: Progressing ?  ?Problem: Elimination: ?Goal: Will not experience complications related to bowel motility ?Outcome: Progressing ?Goal: Will not experience complications related to urinary retention ?Outcome: Progressing ?  ?Problem: Pain Managment: ?Goal: General experience of comfort will improve ?Outcome: Progressing ?  ?Problem: Safety: ?Goal: Ability to remain free from injury will improve ?Outcome: Progressing ?  ?Problem: Skin Integrity: ?Goal: Risk for impaired skin integrity will decrease ?Outcome: Progressing ?  ?

## 2021-05-21 NOTE — Plan of Care (Signed)
?  Problem: Education: ?Goal: Required Educational Video(s) ?05/21/2021 1223 by Daisy Blossom, RN ?Outcome: Adequate for Discharge ?05/21/2021 1113 by Geza Beranek, Camillo Flaming, RN ?Outcome: Progressing ?  ?Problem: Clinical Measurements: ?Goal: Postoperative complications will be avoided or minimized ?05/21/2021 1223 by Daisy Blossom, RN ?Outcome: Adequate for Discharge ?05/21/2021 1113 by Kameron Blethen, Camillo Flaming, RN ?Outcome: Progressing ?  ?Problem: Skin Integrity: ?Goal: Demonstration of wound healing without infection will improve ?05/21/2021 1223 by Daisy Blossom, RN ?Outcome: Adequate for Discharge ?05/21/2021 1113 by Savon Bordonaro, Camillo Flaming, RN ?Outcome: Progressing ?  ?Problem: Education: ?Goal: Knowledge of General Education information will improve ?Description: Including pain rating scale, medication(s)/side effects and non-pharmacologic comfort measures ?05/21/2021 1223 by Daisy Blossom, RN ?Outcome: Adequate for Discharge ?05/21/2021 1113 by Kilo Eshelman, Camillo Flaming, RN ?Outcome: Progressing ?  ?Problem: Health Behavior/Discharge Planning: ?Goal: Ability to manage health-related needs will improve ?05/21/2021 1223 by Daisy Blossom, RN ?Outcome: Adequate for Discharge ?05/21/2021 1113 by Halsey Hammen, Camillo Flaming, RN ?Outcome: Progressing ?  ?Problem: Clinical Measurements: ?Goal: Ability to maintain clinical measurements within normal limits will improve ?05/21/2021 1223 by Daisy Blossom, RN ?Outcome: Adequate for Discharge ?05/21/2021 1113 by Camylle Whicker, Camillo Flaming, RN ?Outcome: Progressing ?Goal: Will remain free from infection ?05/21/2021 1223 by Daisy Blossom, RN ?Outcome: Adequate for Discharge ?05/21/2021 1113 by Azeez Dunker, Camillo Flaming, RN ?Outcome: Progressing ?Goal: Diagnostic test results will improve ?05/21/2021 1223 by Daisy Blossom, RN ?Outcome: Adequate for Discharge ?05/21/2021 1113 by Nesta Kimple, Camillo Flaming, RN ?Outcome: Progressing ?Goal: Respiratory complications will improve ?05/21/2021 1223 by Daisy Blossom, RN ?Outcome: Adequate for Discharge ?05/21/2021 1113 by Natascha Edmonds, Camillo Flaming, RN ?Outcome: Progressing ?Goal: Cardiovascular complication will be avoided ?05/21/2021 1223 by Daisy Blossom, RN ?Outcome: Adequate for Discharge ?05/21/2021 1113 by Zakery Normington, Camillo Flaming, RN ?Outcome: Progressing ?  ?Problem: Activity: ?Goal: Risk for activity intolerance will decrease ?05/21/2021 1223 by Daisy Blossom, RN ?Outcome: Adequate for Discharge ?05/21/2021 1113 by Mitsuye Schrodt, Camillo Flaming, RN ?Outcome: Progressing ?  ?Problem: Nutrition: ?Goal: Adequate nutrition will be maintained ?05/21/2021 1223 by Daisy Blossom, RN ?Outcome: Adequate for Discharge ?05/21/2021 1113 by Ronin Rehfeldt, Camillo Flaming, RN ?Outcome: Progressing ?  ?Problem: Elimination: ?Goal: Will not experience complications related to bowel motility ?05/21/2021 1223 by Daisy Blossom, RN ?Outcome: Adequate for Discharge ?05/21/2021 1113 by Holly Pring, Camillo Flaming, RN ?Outcome: Progressing ?Goal: Will not experience complications related to urinary retention ?05/21/2021 1223 by Daisy Blossom, RN ?Outcome: Adequate for Discharge ?05/21/2021 1113 by Sheffield Hawker, Camillo Flaming, RN ?Outcome: Progressing ?  ?Problem: Pain Managment: ?Goal: General experience of comfort will improve ?05/21/2021 1223 by Daisy Blossom, RN ?Outcome: Adequate for Discharge ?05/21/2021 1113 by Velencia Lenart, Camillo Flaming, RN ?Outcome: Progressing ?  ?Problem: Safety: ?Goal: Ability to remain free from injury will improve ?05/21/2021 1223 by Daisy Blossom, RN ?Outcome: Adequate for Discharge ?05/21/2021 1113 by Shaneil Yazdi, Camillo Flaming, RN ?Outcome: Progressing ?  ?Problem: Skin Integrity: ?Goal: Risk for impaired skin integrity will decrease ?05/21/2021 1223 by Daisy Blossom, RN ?Outcome: Adequate for Discharge ?05/21/2021 1113 by Chalmer Zheng, Camillo Flaming, RN ?Outcome: Progressing ?  ?

## 2021-06-02 DIAGNOSIS — R300 Dysuria: Secondary | ICD-10-CM | POA: Diagnosis not present

## 2021-06-02 DIAGNOSIS — R1032 Left lower quadrant pain: Secondary | ICD-10-CM | POA: Diagnosis not present

## 2021-06-03 ENCOUNTER — Other Ambulatory Visit (HOSPITAL_COMMUNITY): Payer: Self-pay | Admitting: Surgery

## 2021-06-03 ENCOUNTER — Other Ambulatory Visit: Payer: Self-pay | Admitting: Surgery

## 2021-06-03 DIAGNOSIS — R1032 Left lower quadrant pain: Secondary | ICD-10-CM

## 2021-06-03 DIAGNOSIS — K5732 Diverticulitis of large intestine without perforation or abscess without bleeding: Secondary | ICD-10-CM

## 2021-06-09 ENCOUNTER — Encounter (HOSPITAL_BASED_OUTPATIENT_CLINIC_OR_DEPARTMENT_OTHER): Payer: Self-pay

## 2021-06-09 ENCOUNTER — Ambulatory Visit (HOSPITAL_BASED_OUTPATIENT_CLINIC_OR_DEPARTMENT_OTHER)
Admission: RE | Admit: 2021-06-09 | Discharge: 2021-06-09 | Disposition: A | Discharge status: Home / Self Care | Payer: 59 | Source: Ambulatory Visit | Attending: Surgery | Admitting: Surgery

## 2021-06-09 DIAGNOSIS — R1032 Left lower quadrant pain: Secondary | ICD-10-CM | POA: Diagnosis not present

## 2021-06-09 DIAGNOSIS — K5732 Diverticulitis of large intestine without perforation or abscess without bleeding: Secondary | ICD-10-CM | POA: Diagnosis not present

## 2021-06-09 MED ORDER — IOHEXOL 300 MG/ML  SOLN
100.0000 mL | Freq: Once | INTRAMUSCULAR | Status: AC | PRN
  Administered 2021-06-09: 85 mL via INTRAVENOUS

## 2021-07-15 ENCOUNTER — Encounter: Payer: Self-pay | Admitting: Internal Medicine

## 2021-07-15 ENCOUNTER — Other Ambulatory Visit: Payer: Self-pay | Admitting: Internal Medicine

## 2021-07-15 MED ORDER — TIRZEPATIDE 12.5 MG/0.5ML ~~LOC~~ SOAJ: 12.5000 mg | SUBCUTANEOUS | 0 refills | Status: DC

## 2021-07-20 ENCOUNTER — Encounter: Payer: Self-pay | Admitting: Internal Medicine

## 2021-07-21 ENCOUNTER — Other Ambulatory Visit: Payer: Self-pay

## 2021-07-21 DIAGNOSIS — Z1231 Encounter for screening mammogram for malignant neoplasm of breast: Secondary | ICD-10-CM

## 2021-07-23 ENCOUNTER — Ambulatory Visit: Payer: 59

## 2021-07-23 NOTE — Telephone Encounter (Signed)
Pt called in stating that she is having breast augmentation repair surgery and the doctor would like for her to stop taking medication (meloxicam (MOBIC) 7.5 MG tablet) and to be placed on another medication (Celebrex)... Pt requesting callback ?

## 2021-07-27 ENCOUNTER — Encounter: Payer: Self-pay | Admitting: Internal Medicine

## 2021-07-28 ENCOUNTER — Other Ambulatory Visit: Payer: Self-pay | Admitting: Internal Medicine

## 2021-07-28 MED ORDER — CELECOXIB 200 MG PO CAPS: 200.0000 mg | ORAL_CAPSULE | Freq: Two times a day (BID) | ORAL | 0 refills | Status: DC

## 2021-07-29 ENCOUNTER — Ambulatory Visit
Admission: RE | Admit: 2021-07-29 | Discharge: 2021-07-29 | Disposition: A | Discharge status: Home / Self Care | Payer: 59 | Source: Ambulatory Visit | Attending: Internal Medicine | Admitting: Internal Medicine

## 2021-07-29 DIAGNOSIS — Z1231 Encounter for screening mammogram for malignant neoplasm of breast: Secondary | ICD-10-CM | POA: Diagnosis not present

## 2021-08-19 ENCOUNTER — Other Ambulatory Visit: Payer: Self-pay | Admitting: Internal Medicine

## 2021-08-21 ENCOUNTER — Other Ambulatory Visit: Payer: Self-pay | Admitting: Internal Medicine

## 2021-08-23 MED ORDER — MONTELUKAST SODIUM 10 MG PO TABS: 10.0000 mg | ORAL_TABLET | Freq: Every day | ORAL | 1 refills | Status: DC

## 2021-08-23 MED ORDER — MOUNJARO 12.5 MG/0.5ML ~~LOC~~ SOAJ: 12.5000 mg | SUBCUTANEOUS | 0 refills | Status: DC

## 2021-08-24 ENCOUNTER — Encounter: Payer: Self-pay | Admitting: Internal Medicine

## 2021-09-20 NOTE — Telephone Encounter (Signed)
Pt need PA for mounjaro 12.5

## 2021-09-29 ENCOUNTER — Encounter: Payer: Self-pay | Admitting: Internal Medicine

## 2021-09-29 ENCOUNTER — Other Ambulatory Visit: Payer: Self-pay | Admitting: Internal Medicine

## 2021-09-29 ENCOUNTER — Ambulatory Visit: Payer: 59 | Admitting: Internal Medicine

## 2021-09-29 VITALS — BP 122/74 | HR 79 | Temp 98.4°F | Ht 64.0 in | Wt 157.2 lb

## 2021-09-29 DIAGNOSIS — R7301 Impaired fasting glucose: Secondary | ICD-10-CM | POA: Diagnosis not present

## 2021-09-29 DIAGNOSIS — Z9882 Breast implant status: Secondary | ICD-10-CM

## 2021-09-29 DIAGNOSIS — E663 Overweight: Secondary | ICD-10-CM | POA: Diagnosis not present

## 2021-09-29 DIAGNOSIS — K5730 Diverticulosis of large intestine without perforation or abscess without bleeding: Secondary | ICD-10-CM

## 2021-09-29 MED ORDER — RYBELSUS 7 MG PO TABS: 7.0000 mg | ORAL_TABLET | Freq: Every day | ORAL | 0 refills | Status: DC

## 2021-09-29 NOTE — Progress Notes (Signed)
Subjective:  Patient ID: Paula Moses, female    DOB: 07-06-1965  Age: 56 y.o. MRN: [id number]  CC: The primary encounter diagnosis was Impaired fasting glucose. Diagnoses of Diverticulosis large intestine w/o perforation or abscess w/o bleeding, Overweight, and H/O breast augmentation were also pertinent to this visit.   HPI Paula Moses presents for follow up on weight loss managment Chief Complaint  Patient presents with   Weight Loss   Overweight:  she is no longer taking mounjaro due to  restructuring and rise in cost to $457/month .  She had lost 25 lbs from Sept to July but wants to lose 10 more lbs.   Has not been exercising since her multiple surgeries (partial  colectomy and breast surgery ).  She has one dose left of the 10 mg dose .but was not losing weight on it.    Breast augmentation repair surgery was done  on both  breast s May 30 (0p note not available via Epic ) by Kurtis Bushman at [facility].  Drains  were placed and were removed  the following  week.   No complications,  has had follow up with Harlow Mares.  Scar tissue and suturing was done to create a barrier of expansion to rebuild the right breast side which had collapsed .   he is S/p partial colectomy which was done on March 6 due to recurrent diverticulitis.  She is    Healing well ,  did not require a colostomy.  No complications other than abdominal pain that persisted for several weeks but has finally resolved.   Reviewed the MOA of Mounjaro ,  the idea of transitioning to Rybelsus .       Outpatient Medications Prior to Visit  Medication Sig Dispense Refill   hydrochlorothiazide (HYDRODIURIL) 25 MG tablet Take 1 tablet (25 mg total) by mouth daily. 90 tablet 3   meloxicam (MOBIC) 7.5 MG tablet Take 1 tablet by mouth once daily 90 tablet 1   montelukast (SINGULAIR) 10 MG tablet Take 1 tablet (10 mg total) by mouth at bedtime. 30 tablet 1   omeprazole (PRILOSEC) 40 MG capsule 40 mg daily as  needed (Heartburn).     PRESCRIPTION MEDICATION Testosterone and estrogen pellet implants in buttocks     dicyclomine (BENTYL) 10 MG capsule Take 1 capsule (10 mg total) by mouth 4 (four) times daily as needed for up to 7 days for spasms (Abdominal cramps). 28 capsule 0   buPROPion (WELLBUTRIN XL) 300 MG 24 hr tablet Take 1 tablet (300 mg total) by mouth daily. (Patient not taking: Reported on 09/29/2021) 90 tablet 1   celecoxib (CELEBREX) 200 MG capsule Take 1 capsule (200 mg total) by mouth 2 (two) times daily. (Patient not taking: Reported on 09/29/2021) 60 capsule 0   HYDROcodone-acetaminophen (NORCO) 5-325 MG tablet Take 1 tablet by mouth every 6 (six) hours as needed for up to 15 doses for moderate pain. (Patient not taking: Reported on 09/29/2021) 15 tablet 0   OVER THE COUNTER MEDICATION Take 1 tablet by mouth daily as needed (Weight loss). renew life (Patient not taking: Reported on 09/29/2021)     polyethylene glycol powder (GLYCOLAX/MIRALAX) 17 GM/SCOOP powder Take 0.5 Containers by mouth daily as needed for moderate constipation or mild constipation. (Patient not taking: Reported on 05/11/2021)     progesterone (PROMETRIUM) 100 MG capsule Take 100 mg by mouth at bedtime. Sonia compounding (K 100 )++ (Patient not taking: Reported on 09/29/2021)  tirzepatide (MOUNJARO) 12.5 MG/0.5ML Pen Inject 12.5 mg into the skin once a week. (Patient not taking: Reported on 09/29/2021) 4 mL 0   Facility-Administered Medications Prior to Visit  Medication Dose Route Frequency Provider Last Rate Last Admin   hyoscyamine (ANASPAZ) disintergrating tablet 0.125 mg  0.125 mg Sublingual Q6H PRN Marval Regal, NP        Review of Systems;  Patient denies headache, fevers, malaise, unintentional weight loss, skin rash, eye pain, sinus congestion and sinus pain, sore throat, dysphagia,  hemoptysis , cough, dyspnea, wheezing, chest pain, palpitations, orthopnea, edema, abdominal pain, nausea, melena, diarrhea,  constipation, flank pain, dysuria, hematuria, urinary  Frequency, nocturia, numbness, tingling, seizures,  Focal weakness, Loss of consciousness,  Tremor, insomnia, depression, anxiety, and suicidal ideation.      Objective:  BP 122/74 (BP Location: Left Arm, Patient Position: Sitting, Cuff Size: Normal)   Pulse 79   Temp 98.4 F (36.9 C) (Oral)   Ht '5\' 4"'$  (1.626 m)   Wt 157 lb 3.2 oz (71.3 kg)   SpO2 98%   BMI 26.98 kg/m   BP Readings from Last 3 Encounters:  09/29/21 122/74  05/21/21 127/80  12/03/20 110/64    Wt Readings from Last 3 Encounters:  09/29/21 157 lb 3.2 oz (71.3 kg)  05/17/21 160 lb (72.6 kg)  05/11/21 160 lb (72.6 kg)    General appearance: alert, cooperative and appears stated age Ears: normal TM's and external ear canals both ears Throat: lips, mucosa, and tongue normal; teeth and gums normal Neck: no adenopathy, no carotid bruit, supple, symmetrical, trachea midline and thyroid not enlarged, symmetric, no tenderness/mass/nodules Back: symmetric, no curvature. ROM normal. No CVA tenderness. Lungs: clear to auscultation bilaterally Heart: regular rate and rhythm, S1, S2 normal, no murmur, click, rub or gallop Abdomen: soft, non-tender; bowel sounds normal; no masses,  no organomegaly Pulses: 2+ and symmetric Skin: Skin color, texture, turgor normal. No rashes or lesions Lymph nodes: Cervical, supraclavicular, and axillary nodes normal.  Lab Results  Component Value Date   HGBA1C 6.0 06/24/2020    Lab Results  Component Value Date   CREATININE 0.82 05/20/2021   CREATININE 0.89 05/19/2021   CREATININE 0.92 05/18/2021    Lab Results  Component Value Date   WBC 8.4 05/20/2021   HGB 11.4 (L) 05/20/2021   HCT 33.7 (L) 05/20/2021   PLT 209 05/20/2021   GLUCOSE 89 05/20/2021   CHOL 218 (H) 06/24/2020   TRIG 225.0 (H) 06/24/2020   HDL 37.50 (L) 06/24/2020   LDLDIRECT 157.0 06/24/2020   ALT 17 09/24/2020   AST 18 09/24/2020   NA 136 05/20/2021    K 4.1 05/20/2021   CL 103 05/20/2021   CREATININE 0.82 05/20/2021   BUN 7 05/20/2021   CO2 27 05/20/2021   TSH 3.57 06/17/2020   INR 0.96 10/25/2017   HGBA1C 6.0 06/24/2020   MICROALBUR <0.7 06/17/2020    MM 3D SCREEN BREAST W/IMPLANT BILATERAL  Result Date: 07/30/2021 CLINICAL DATA:  Screening. EXAM: DIGITAL SCREENING BILATERAL MAMMOGRAM WITH IMPLANTS, CAD AND TOMOSYNTHESIS TECHNIQUE: Bilateral screening digital craniocaudal and mediolateral oblique mammograms were obtained. Bilateral screening digital breast tomosynthesis was performed. The images were evaluated with computer-aided detection. Standard and/or implant displaced views were performed. COMPARISON:  Previous exam(s). ACR Breast Density Category b: There are scattered areas of fibroglandular density. FINDINGS: The patient has retropectoral implants. There are no findings suspicious for malignancy. IMPRESSION: No mammographic evidence of malignancy. A result letter of this screening mammogram will  be mailed directly to the patient. RECOMMENDATION: Screening mammogram in one year. (Code:SM-B-01Y) BI-RADS CATEGORY  1:  Negative. Electronically Signed   By: Ileana Roup M.D.   On: 07/30/2021 10:45    Assessment & Plan:   Problem List Items Addressed This Visit     Overweight    Significant reduction in  BMI with pharmacotherapy (Mounjaro) and regular participation in  exercise with 20 lb wt loss since initiation of therapy in June .  BMI is currently 28. Her goal is 150-155 lbs.  Currently at  10 mg weekly dose but dealing with an increase in cost.  Changing to rybelsus 7 mg daily       Diverticulosis large intestine w/o perforation or abscess w/o bleeding    Recurrent episodes.  She is s/p partial colectomy March 2023      H/O breast augmentation    S/p recent surgical repair of collapsed implants.      Other Visit Diagnoses     Impaired fasting glucose    -  Primary   Relevant Orders   Hemoglobin A1c   Comprehensive  metabolic panel       I spent a total of 30 minutes with this patient in a face to face visit on the date of this encounter reviewing the last office visit with me recent colonic resection and breast surgery, , patient's  diet and eating habits,  and post visit ordering of testing and therapeutics.    Follow-up: Return in about 3 months (around 12/30/2021).   [name], MD

## 2021-09-29 NOTE — Patient Instructions (Signed)
I am prescribing Rybelsus which is THE SAME MEDICATION AS OZEMPIC/WEGOVY BUT IN PILL FORM  The side effects and mechanism of action are the same as Mounjaro   I have chosen the middle dose (7 mg) to make the transition. The next and highest dose is 14 mg,  so if after 4 weeks you have not lost weight and are not nauseated,  we can increase the dose to 14 mg daily

## 2021-10-01 DIAGNOSIS — Z9882 Breast implant status: Secondary | ICD-10-CM | POA: Insufficient documentation

## 2021-10-01 NOTE — Assessment & Plan Note (Signed)
S/p recent surgical repair of collapsed implants.

## 2021-10-01 NOTE — Telephone Encounter (Signed)
Spoke with Patient about Dr. Derrel Nip sending in Metformin XR for her. Patient voiced understanding and said thank you.

## 2021-10-01 NOTE — Assessment & Plan Note (Addendum)
Significant reduction in  BMI with pharmacotherapy [name]) and regular participation in  exercise with 20 lb wt loss since initiation of therapy in June .  BMI is currently 28. Her goal is 150-155 lbs.  Currently at  10 mg weekly dose but dealing with an increase in cost.  Changing to rybelsus 7 mg daily

## 2021-10-01 NOTE — Assessment & Plan Note (Addendum)
Recurrent episodes.  She is s/p partial colectomy March 2023

## 2021-10-06 ENCOUNTER — Encounter: Payer: Self-pay | Admitting: Internal Medicine

## 2021-10-07 NOTE — Telephone Encounter (Signed)
See previous mychart message

## 2021-10-07 NOTE — Telephone Encounter (Signed)
Pt would like to try the Metformin XR.

## 2021-10-11 ENCOUNTER — Other Ambulatory Visit: Payer: Self-pay | Admitting: Internal Medicine

## 2021-10-11 ENCOUNTER — Telehealth: Payer: Self-pay

## 2021-10-11 DIAGNOSIS — E663 Overweight: Secondary | ICD-10-CM

## 2021-10-11 MED ORDER — METFORMIN HCL ER 500 MG PO TB24: 500.0000 mg | ORAL_TABLET | Freq: Every day | ORAL | 1 refills | Status: DC

## 2021-10-11 MED ORDER — MONTELUKAST SODIUM 10 MG PO TABS: 10.0000 mg | ORAL_TABLET | Freq: Every day | ORAL | 3 refills | Status: DC

## 2021-10-11 NOTE — Telephone Encounter (Signed)
See telephone encounter.

## 2021-10-11 NOTE — Telephone Encounter (Signed)
Patient called and message was read from Dr Derrel Nip.  Patient also is requesting to have a refill on her montelukast (SINGULAIR) 10 MG tablet.

## 2021-10-11 NOTE — Addendum Note (Signed)
Addended by: Adair Laundry on: 10/11/2021 01:49 PM   Modules accepted: Orders

## 2021-10-11 NOTE — Telephone Encounter (Signed)
LMTCB

## 2021-10-11 NOTE — Telephone Encounter (Signed)
Pt sent a mychart message stating that she would like to try metformin since the other options for weight loss are not covered any longer.

## 2021-10-11 NOTE — Assessment & Plan Note (Signed)
Significant reduction in  BMI with pharmacotherapy Paula Moses) and regular participation in  exercise with 20 lb wt loss since initiation of therapy in June .  BMI is currently 28. Her goal is 150-155 lbs.  Currently at  10 mg weekly dose but dealing with an increase in cost. Rybelsus not covered,  ,  Trial of metformin XR 500 mg starting dose once daily

## 2021-10-11 NOTE — Telephone Encounter (Signed)
Medication has been refilled.

## 2021-12-08 ENCOUNTER — Other Ambulatory Visit: Payer: Self-pay | Admitting: Internal Medicine

## 2021-12-11 ENCOUNTER — Other Ambulatory Visit: Payer: Self-pay | Admitting: Internal Medicine

## 2022-01-20 DIAGNOSIS — Z7689 Persons encountering health services in other specified circumstances: Secondary | ICD-10-CM | POA: Diagnosis not present

## 2022-02-07 DIAGNOSIS — J382 Nodules of vocal cords: Secondary | ICD-10-CM | POA: Diagnosis not present

## 2022-02-07 DIAGNOSIS — K219 Gastro-esophageal reflux disease without esophagitis: Secondary | ICD-10-CM | POA: Diagnosis not present

## 2022-02-07 DIAGNOSIS — R490 Dysphonia: Secondary | ICD-10-CM | POA: Diagnosis not present

## 2022-02-23 NOTE — Telephone Encounter (Signed)
MyChart messgae sent to patient. 

## 2022-04-01 ENCOUNTER — Telehealth: Payer: Self-pay | Admitting: Internal Medicine

## 2022-04-01 ENCOUNTER — Other Ambulatory Visit: Payer: Self-pay | Admitting: Internal Medicine

## 2022-04-01 NOTE — Telephone Encounter (Signed)
Pt need a refill on hydrochlorothiazide meloxicam sent to [facility]

## 2022-04-01 NOTE — Telephone Encounter (Signed)
Spoke with pt to let her know that medication has been refilled.

## 2022-04-05 ENCOUNTER — Other Ambulatory Visit: Payer: Self-pay | Admitting: Internal Medicine

## 2022-04-12 ENCOUNTER — Telehealth: Payer: 59 | Admitting: Physician Assistant

## 2022-04-12 DIAGNOSIS — J069 Acute upper respiratory infection, unspecified: Secondary | ICD-10-CM | POA: Diagnosis not present

## 2022-04-12 MED ORDER — BENZONATATE 100 MG PO CAPS
100.0000 mg | ORAL_CAPSULE | Freq: Three times a day (TID) | ORAL | 0 refills | Status: DC | PRN
Start: 2022-04-12 — End: 2022-11-28

## 2022-04-12 MED ORDER — FLUTICASONE PROPIONATE 50 MCG/ACT NA SUSP: 2.0000 | Freq: Every day | NASAL | 0 refills | Status: AC

## 2022-04-12 NOTE — Progress Notes (Signed)

## 2022-04-12 NOTE — Progress Notes (Signed)
I have spent 5 minutes in review of e-visit questionnaire, review and updating patient chart, medical decision making and response to patient.   Nada Godley Cody Hazael Olveda, PA-C    

## 2022-05-02 DIAGNOSIS — N951 Menopausal and female climacteric states: Secondary | ICD-10-CM | POA: Diagnosis not present

## 2022-06-14 ENCOUNTER — Encounter: Payer: Self-pay | Admitting: Internal Medicine

## 2022-07-15 ENCOUNTER — Other Ambulatory Visit: Payer: Self-pay | Admitting: Internal Medicine

## 2022-07-20 ENCOUNTER — Ambulatory Visit: Payer: 59 | Admitting: Orthopaedic Surgery

## 2022-07-27 DIAGNOSIS — Z7689 Persons encountering health services in other specified circumstances: Secondary | ICD-10-CM | POA: Diagnosis not present

## 2022-08-10 ENCOUNTER — Other Ambulatory Visit (INDEPENDENT_AMBULATORY_CARE_PROVIDER_SITE_OTHER): Payer: 59

## 2022-08-10 ENCOUNTER — Ambulatory Visit: Payer: 59 | Admitting: Orthopaedic Surgery

## 2022-08-10 DIAGNOSIS — Z96641 Presence of right artificial hip joint: Secondary | ICD-10-CM | POA: Diagnosis not present

## 2022-08-10 DIAGNOSIS — M25551 Pain in right hip: Secondary | ICD-10-CM | POA: Diagnosis not present

## 2022-08-10 NOTE — Progress Notes (Signed)
The patient is a 57 year old female who has a history of both her hips being replaced.  I replaced her left hip through a direct anterior approach in January 2020.  In 2019 she had a hip replacement done in [place].  I believe that was an anterior approach as well.  She continues to have some problems with popping sensation and thigh discomfort of the right hip replacement.  She is asymptomatic on the left side.  It does occur during different activities and not so much during rest at all.  She has times where is bothering her more than others.  On exam she does seem to hurt at the extreme of flexion on her right hip and not so much in the left hip.  It does move fluidly and smoothly and her leg lengths appear equal.  She does not walk with any significant limp.  An AP pelvis and lateral of the right hip shows bilateral total hip arthroplasties with no complicating features of the components.  The right hip acetabular opponent is much larger and does have some overhang so this could be irritating the iliopsoas tendon.  I would like an appointment set up for her with my partner Dr. Shon Baton so he can look at her right hip under ultrasound especially the iliopsoas tendon to see if he can see any pathology as it relates to that tendon and its function around the acetabular component.  I would like him to consider steroid injection in this area as well under ultrasound guidance.  He can then get her back to me a few weeks later.  She agrees with this treatment plan.

## 2022-08-23 ENCOUNTER — Other Ambulatory Visit: Payer: Self-pay

## 2022-08-23 ENCOUNTER — Ambulatory Visit: Payer: 59 | Admitting: Sports Medicine

## 2022-08-23 ENCOUNTER — Encounter: Payer: Self-pay | Admitting: Sports Medicine

## 2022-08-23 DIAGNOSIS — Z96641 Presence of right artificial hip joint: Secondary | ICD-10-CM

## 2022-08-23 DIAGNOSIS — M25551 Pain in right hip: Secondary | ICD-10-CM | POA: Diagnosis not present

## 2022-08-23 NOTE — Progress Notes (Signed)
Paula Moses - 57 y.o. female MRN [id number]  Date of birth: Aug 02, 1965  Office Visit Note: Visit Date: 08/23/2022 PCP: Sherlene Shams, MD Referred by: Sherlene Shams, MD  Subjective: Chief Complaint  Patient presents with   Right Hip - Pain   HPI: Paula Moses is a pleasant 57 y.o. female who presents today for right hip pain with previous THA.  Had her right hip replaced in 2019 in [place].  She states she took it while to recover from this.  But she continues to have problems with a popping sensation in the groin and anterior thigh.  This does not always cause pain but it is audible at times and she can feel a popping or grating sensation when taking the hip through flexion or certain range of motion.  She is working with a physical therapist to maintain good range of motion and strength of both hips.  Has taken meloxicam 7.5 mg in the past.  Pertinent ROS were reviewed with the patient and found to be negative unless otherwise specified above in HPI.   Assessment & Plan: Visit Diagnoses:  1. Pain in right hip   2. History of right hip replacement    Plan: Discussed with Paula Moses a possible etiology of her chronic hip pain.  Ultrasound did not show any evidence of high-grade tearing of her anterior hip flexors, I cannot appreciate a significant bursitis of the iliopsoas sheath, however on active and dynamic testing the iliopsoas sheath is directly superficial to her prosthesis and does appear to rub this region with dynamic hip flexion.  This could be the origin of her pain.  Decision making for both hopefully diagnostic and therapeutic purposes, did proceed with an iliopsoas tendon sheath injection under ultrasound guidance, patient tolerated well.  Will allow for 48 hours of modified activity, may use ice over-the-counter anti-inflammatories for postinjection pain.  I would like her to pay attention over the next 1 to 2 weeks to see how much this improves her pain and to what  degree and if this does improve her popping/grinding sensation.  She will follow-up with Dr. Magnus Ivan in a few weeks to review this and discuss next options.  Follow-up: Return for F/u with Dr. Magnus Ivan for R-hip in about 2-3 weeks.   Meds & Orders: No orders of the defined types were placed in this encounter.   Orders Placed This Encounter  Procedures   US Guided Needle Placement - No Linked Charges   [place] Extrem Low Right Ltd     Procedures: US-guided Iliopsoas Tendon Sheath Injection, Right Hip: After discussion on risk/benefits/indications, an informed verbal consent was obtained. A timeout was then performed. The patient was lying supine on examination table with the affected leg relaxed in neutral position. The area overlying the groin and psoas tendon was prepped with ChloraPrep and multiple alcohol swabs. The ultrasound probe was placed in an oblique plane parallel to the inguinal ligament and superior to femoral head. The overlying soft tissue was anesthesized with 3cc of lidocaine 1%. Using ultrasound guidance via an in-plane approach, a 22-gauge, 3.5" needle was inserted from a lateral to medial direction into the iliopsoas tendon sheath between the tendon and ilium. The tendon sheath was then injected with a mixture of 1.5:1.5:1.5 cc of lidocaine:bupivicaine:celestone. Appropriate spread of the injectate within the tendon sheath was visualized with ultrasound guidance. Patient tolerated the procedure well without immediate complications.  A Band-Aid was then applied.  Clinical History: No specialty comments available.  She reports that she has never smoked. She has never used smokeless tobacco. No results for input(s): "HGBA1C", "LABURIC" in the last 8760 hours.  Objective:   Vital Signs: There were no vitals taken for this visit.  Physical Exam  Gen: Well-appearing, in no acute distress; non-toxic CV: Well-perfused. Warm.  Resp: Breathing unlabored on room air; no  wheezing. Psych: Fluid speech in conversation; appropriate affect; normal thought process Neuro: Sensation intact throughout. No gross coordination deficits.   Ortho Exam - Right hip: No bony TTP, no overlying swelling.  Some mild discomfort on palpating within the inguinal crease.  There is some pain with resisted hip flexion and more so with external rotation and resisted hip flexion.  No mechanical blocks to rotation.  Nonantalgic gait.  Hip abduction strength is 5/5.  Imaging: [place] Extrem Low Right Ltd  Result Date: 08/23/2022 Limited musculoskeletal ultrasound of the right lower extremity, right anterior hip was performed today.  Evaluation of the ASIS and insertional sartorius does show some insertional tendinopathy, although there is no hyperemia ear or notable external palpation in this region.  Evaluation of the AIIS shows no cortical defect, there is insertional rectus femoris tendon which appears with proper insertion and without evidence of tearing or tendinopathic changes.  The right hip joint was evaluated with prosthesis present, I cannot appreciate any joint effusion.  Oblique and long axis view of the iliopsoas tendon is visualized without significant iliopsoas bursitis, when taking this to dynamic motion this does appear to be directly superior to the overlying hip arthroplasty..  Associated neurovasculature seen with the femoral artery.     XR HIP UNILAT W OR W/O PELVIS 1V RIGHT An AP pelvis and lateral of the right hip does show bilateral total hip  arthroplasties.  There does not appear to be any gross loosening of either  of the components on either hip.  The right side does have a larger  acetabular component with some overhang of the cup.   Past Medical/Family/Surgical/Social History: Medications & Allergies reviewed per EMR, new medications updated. Patient Active Problem List   Diagnosis Date Noted   H/O breast augmentation 10/01/2021   Diverticulitis 05/17/2021    Dyspareunia in female 03/16/2021   Breast cancer screening by mammogram 03/16/2021   Diverticulosis large intestine w/o perforation or abscess w/o bleeding 12/05/2020   GERD without esophagitis 12/05/2020   Hospital discharge follow-up 09/26/2020   Concentration deficit 06/24/2020   Atherosclerosis of iliac artery 06/24/2020   Posterior knee pain, right 06/24/2020   History of blood in urine 09/04/2019   Irritable bowel syndrome 09/04/2019   Internal bleeding hemorrhoids 06/20/2019   Spinal stenosis of lumbar region without neurogenic claudication 02/28/2019   Overweight 02/28/2019   History of total right hip replacement 02/25/2019   Screening for colon cancer    Rectal polyp    Polyp of colon    Osteoarthritis of fingers of both hands 12/21/2018   Constipation 12/12/2018   Status post total replacement of left hip 03/30/2018   Unilateral primary osteoarthritis, left hip 01/22/2018   Arthritis of left hip 12/20/2017   Degenerative disc disease, lumbar 12/20/2017   Status post total hip replacement, right 10/26/2017   Lumbar spondylosis 02/13/2017   Surgical menopause 01/26/2017   Status post vaginal hysterectomy BSO 01/26/2017   Hypertension    High cholesterol    Fibroids    Trigeminal neuralgia    Neck pain on left side 07/16/2013   Paresthesia  07/16/2013   Past Medical History:  Diagnosis Date   Abnormal Pap smear of cervix    Abnormal vaginal bleeding    Anxiety    Arthritis    neck and body   Complication of anesthesia    IT TAKES ALOT OF ANESTHESIA TO KEEP PT SEDATED   Cyst of vulva    Dyspareunia    External hemorrhoid    Fibroids    GERD (gastroesophageal reflux disease)    Headache 2019   migraines, becoming more frequent   High cholesterol    History of UTI    Hypertension    Left-sided face pain    Neck pain    Paresthesia    Rectocele    Surgical menopause    Trigeminal neuralgia    Uterus prolapse    Family History  Problem Relation Age of  Onset   Brain cancer Mother    Dementia Father    Breast cancer Neg Hx    Past Surgical History:  Procedure Laterality Date   AUGMENTATION MAMMAPLASTY Bilateral 2006   BREAST ENHANCEMENT SURGERY     CHOLECYSTECTOMY     COLONOSCOPY WITH PROPOFOL N/A 01/31/2019   Procedure: COLONOSCOPY WITH PROPOFOL;  Surgeon: Pasty Spillers, MD;  Location: [facility];  Service: Endoscopy;  Laterality: N/A;  2 day prep    HIP ARTHROSCOPY Right 05/05/2017   Procedure: ARTHROSCOPY HIP;  Surgeon: Signa Kell, MD;  Location: [facility];  Service: Orthopedics;  Laterality: Right;   LAPAROSCOPY     X2   TOTAL HIP ARTHROPLASTY Right 10/26/2017   Procedure: TOTAL HIP ARTHROPLASTY ANTERIOR APPROACH;  Surgeon: Kennedy Bucker, MD;  Location: [facility] OR[facility];  Service: Orthopedics;  Laterality: Right;   TOTAL HIP ARTHROPLASTY Left 03/30/2018   Procedure: LEFT TOTAL HIP ARTHROPLASTY ANTERIOR APPROACH;  Surgeon: Kathryne Hitch, MD;  Location: [facility];  Service: Orthopedics;  Laterality: Left;   tummy tuck      VAGINAL HYSTERECTOMY     lahv bso   Social History   Occupational History   Occupation: Veterinary surgeon  Tobacco Use   Smoking status: Never   Smokeless tobacco: Never  Vaping Use   Vaping Use: Never used  Substance and Sexual Activity   Alcohol use: Yes    Comment: occ   Drug use: No   Sexual activity: Yes    Birth control/protection: Surgical

## 2022-08-23 NOTE — Progress Notes (Signed)
Us/

## 2022-09-21 ENCOUNTER — Ambulatory Visit: Payer: 59 | Admitting: Orthopaedic Surgery

## 2022-09-28 DIAGNOSIS — L700 Acne vulgaris: Secondary | ICD-10-CM | POA: Diagnosis not present

## 2022-09-28 DIAGNOSIS — D499 Neoplasm of unspecified behavior of unspecified site: Secondary | ICD-10-CM | POA: Diagnosis not present

## 2022-10-10 ENCOUNTER — Telehealth: Payer: Self-pay

## 2022-10-10 NOTE — Telephone Encounter (Signed)
LVM for patient to call back [phone], or to call PCP office to schedule follow up apt. AS, CMA  

## 2022-10-19 ENCOUNTER — Other Ambulatory Visit: Payer: Self-pay | Admitting: Internal Medicine

## 2022-10-27 ENCOUNTER — Encounter (INDEPENDENT_AMBULATORY_CARE_PROVIDER_SITE_OTHER): Payer: Self-pay

## 2022-10-29 ENCOUNTER — Other Ambulatory Visit: Payer: Self-pay | Admitting: Internal Medicine

## 2022-11-16 ENCOUNTER — Encounter: Payer: Self-pay | Admitting: Internal Medicine

## 2022-11-17 MED ORDER — OMEPRAZOLE 40 MG PO CPDR: 40.0000 mg | DELAYED_RELEASE_CAPSULE | Freq: Every day | ORAL | 1 refills | Status: AC | PRN

## 2022-11-17 NOTE — Telephone Encounter (Signed)
Previously refilled by a historical provider.   Last OV: 09/29/2021 Next OV: 11/28/2022  Is it okay to refill?

## 2022-11-21 ENCOUNTER — Other Ambulatory Visit: Payer: Self-pay | Admitting: Internal Medicine

## 2022-11-28 ENCOUNTER — Ambulatory Visit: Payer: 59 | Admitting: Internal Medicine

## 2022-11-28 ENCOUNTER — Encounter: Payer: Self-pay | Admitting: Internal Medicine

## 2022-11-28 VITALS — BP 120/76 | HR 59 | Ht 64.0 in | Wt 176.2 lb

## 2022-11-28 DIAGNOSIS — E7800 Pure hypercholesterolemia, unspecified: Secondary | ICD-10-CM | POA: Diagnosis not present

## 2022-11-28 DIAGNOSIS — E663 Overweight: Secondary | ICD-10-CM

## 2022-11-28 DIAGNOSIS — M19041 Primary osteoarthritis, right hand: Secondary | ICD-10-CM | POA: Diagnosis not present

## 2022-11-28 DIAGNOSIS — I10 Essential (primary) hypertension: Secondary | ICD-10-CM | POA: Diagnosis not present

## 2022-11-28 DIAGNOSIS — M19042 Primary osteoarthritis, left hand: Secondary | ICD-10-CM

## 2022-11-28 DIAGNOSIS — F330 Major depressive disorder, recurrent, mild: Secondary | ICD-10-CM | POA: Insufficient documentation

## 2022-11-28 DIAGNOSIS — Z96641 Presence of right artificial hip joint: Secondary | ICD-10-CM

## 2022-11-28 LAB — COMPREHENSIVE METABOLIC PANEL
ALT: 18 U/L (ref 0–35)
AST: 19 U/L (ref 0–37)
Albumin: 4.5 g/dL (ref 3.5–5.2)
Alkaline Phosphatase: 54 U/L (ref 39–117)
BUN: 14 mg/dL (ref 6–23)
CO2: 29 meq/L (ref 19–32)
Calcium: 9.8 mg/dL (ref 8.4–10.5)
Chloride: 97 meq/L (ref 96–112)
Creatinine, Ser: 1.03 mg/dL (ref 0.40–1.20)
GFR: 61.47 mL/min (ref >60.00)
Glucose, Bld: 82 mg/dL (ref 70–99)
Potassium: 3.8 meq/L (ref 3.5–5.1)
Sodium: 137 meq/L (ref 135–145)
Total Bilirubin: 0.7 mg/dL (ref 0.2–1.2)
Total Protein: 8 g/dL (ref 6.0–8.3)

## 2022-11-28 LAB — LIPID PANEL
Cholesterol: 210 mg/dL — ABNORMAL HIGH (ref 0–200)
HDL: 55.7 mg/dL (ref >39.00)
LDL Cholesterol: 102 mg/dL — ABNORMAL HIGH (ref 0–99)
NonHDL: 153.87
Total CHOL/HDL Ratio: 4
Triglycerides: 259 mg/dL — ABNORMAL HIGH (ref 0.0–149.0)
VLDL: 51.8 mg/dL — ABNORMAL HIGH (ref 0.0–40.0)

## 2022-11-28 LAB — CBC WITH DIFFERENTIAL/PLATELET
Basophils Absolute: 0 10*3/uL (ref 0.0–0.1)
Basophils Relative: 0.6 % (ref 0.0–3.0)
Eosinophils Absolute: 0.1 10*3/uL (ref 0.0–0.7)
Eosinophils Relative: 2.1 % (ref 0.0–5.0)
HCT: 44.4 % (ref 36.0–46.0)
Hemoglobin: 14.8 g/dL (ref 12.0–15.0)
Lymphocytes Relative: 44.6 % (ref 12.0–46.0)
Lymphs Abs: 2.5 10*3/uL (ref 0.7–4.0)
MCHC: 33.4 g/dL (ref 30.0–36.0)
MCV: 91.1 fl (ref 78.0–100.0)
Monocytes Absolute: 0.4 10*3/uL (ref 0.1–1.0)
Monocytes Relative: 6.8 % (ref 3.0–12.0)
Neutro Abs: 2.6 10*3/uL (ref 1.4–7.7)
Neutrophils Relative %: 45.9 % (ref 43.0–77.0)
Platelets: 245 10*3/uL (ref 150.0–400.0)
RBC: 4.87 Mil/uL (ref 3.87–5.11)
RDW: 13.5 % (ref 11.5–15.5)
WBC: 5.7 10*3/uL (ref 4.0–10.5)

## 2022-11-28 LAB — LDL CHOLESTEROL, DIRECT: Direct LDL: 135 mg/dL

## 2022-11-28 LAB — TSH: TSH: 3.99 u[IU]/mL (ref 0.35–5.50)

## 2022-11-28 LAB — HEMOGLOBIN A1C: Hgb A1c MFr Bld: 5.5 % (ref 4.6–6.5)

## 2022-11-28 MED ORDER — HYDROCHLOROTHIAZIDE 25 MG PO TABS
25.0000 mg | ORAL_TABLET | Freq: Every day | ORAL | 1 refills | Status: DC
Start: 2022-11-28 — End: 2023-07-24

## 2022-11-28 MED ORDER — CELECOXIB 200 MG PO CAPS: 200.0000 mg | ORAL_CAPSULE | Freq: Every day | ORAL | 1 refills | Status: DC

## 2022-11-28 MED ORDER — BUPROPION HCL ER (XL) 150 MG PO TB24: 150.0000 mg | ORAL_TABLET | Freq: Every day | ORAL | 1 refills | Status: DC

## 2022-11-28 MED ORDER — METFORMIN HCL ER 500 MG PO TB24
500.0000 mg | ORAL_TABLET | Freq: Every day | ORAL | 1 refills | Status: DC
Start: 2022-11-28 — End: 2023-02-14

## 2022-11-28 NOTE — Assessment & Plan Note (Signed)
With loack of motivation, loss of libido, weight gain.  Repeat trial of wellbutrin starting dose 150 mg daily

## 2022-11-28 NOTE — Assessment & Plan Note (Signed)
Prescribing  celebrex  to use prn  if scheduled tylenol does not control pain

## 2022-11-28 NOTE — Assessment & Plan Note (Signed)
She has persistent symptoms of popping and discomfort with flexion and abduction .

## 2022-11-28 NOTE — Patient Instructions (Addendum)
Resume  metfornin in the evening ,  may increase dose to 2 tablets daily ( if tolerated) after 1-2 weeks   Repeat trial of wellbutrin .  take the welbutrin in the morning (or at least by 2 PM  ).  You can increase the dose AFTER 2 WEEKS IF YOU DO NOT FEEL AN IMPROVEMENT IN MOOD.    You can add up to 2000 mg of acetominophen (tylenol) every day safely  In divided doses (500 mg every 6 hours  Or 1000 mg every 12 hours.)   Avoid  daily use of NSAIDs  , but  use Celebrex if needed

## 2022-11-28 NOTE — Progress Notes (Signed)
Subjective:  Patient ID: Paula Moses, female    DOB: 08-15-1965  Age: 57 y.o. MRN: [id number]  CC: The primary encounter diagnosis was Primary hypertension. Diagnoses of Overweight, High cholesterol, History of total right hip replacement, Major depressive disorder, recurrent episode, mild (HCC), and Osteoarthritis of fingers of both hands were also pertinent to this visit.   HPI Paula Moses presents for  Chief Complaint  Patient presents with   Medical Management of Chronic Issues    2) right hip pain: received steroid injection in the ilispsoas sheath in June by DO at [facility].  History of right  THA 2019  by Domenic Schwab,  (left hip replaced by Magnus Ivan was a different procedure and recover was excellent)  The injection helped somewhat but she did not return to him for future shots .  Starting last week it has returned to "popping"  but not hurting   1) obesity:  reviewed weight management for the last 2 years.  Lost 26 bs using Mounjaro from Sept 2022 to July  2023,, then regained 19 since last July 2023 . Has not been exercising other than walking  for the past 3 weeks  but rior to that was working out at the gym 4 to 5 times per week . Has resumed generic mounjaro recently and currently at 5 mg one month ago but has not lost any weight   3) IPG/prediabetes   4) ( testosterone/estrogen pellets from [facility] [place]. Reducing testosterone due to changes in hair   5) mood disorder : new onset,  triggered by aethetic issues  ( hair loss,  facial acne,  weight gain , lack of energy,  motivation,  decreased libido)  and children leaving   6) arthritis pain in neck,  hips    Outpatient Medications Prior to Visit  Medication Sig Dispense Refill   fluticasone (FLONASE) 50 MCG/ACT nasal spray Place 2 sprays into both nostrils daily. 16 g 0   montelukast (SINGULAIR) 10 MG tablet TAKE 1 TABLET BY MOUTH AT BEDTIME 90 tablet 1   PRESCRIPTION MEDICATION Testosterone and estrogen pellet  implants in buttocks     tretinoin (RETIN-A) 0.05 % cream Apply 1 Application topically at bedtime.     hydrochlorothiazide (HYDRODIURIL) 25 MG tablet Take 1 tablet by mouth once daily 90 tablet 0   meloxicam (MOBIC) 7.5 MG tablet Take 1 tablet by mouth once daily 90 tablet 0   omeprazole (PRILOSEC) 40 MG capsule Take 1 capsule (40 mg total) by mouth daily as needed (Heartburn). (Patient not taking: Reported on 11/28/2022) 90 capsule 1   benzonatate (TESSALON) 100 MG capsule Take 1 capsule (100 mg total) by mouth 3 (three) times daily as needed for cough. (Patient not taking: Reported on 11/28/2022) 30 capsule 0   dicyclomine (BENTYL) 10 MG capsule Take 1 capsule (10 mg total) by mouth 4 (four) times daily as needed for up to 7 days for spasms (Abdominal cramps). 28 capsule 0   metFORMIN (GLUCOPHAGE-XR) 500 MG 24 hr tablet Take 1 tablet by mouth once daily with breakfast (Patient not taking: Reported on 11/28/2022) 90 tablet 0   Facility-Administered Medications Prior to Visit  Medication Dose Route Frequency Provider Last Rate Last Admin   hyoscyamine (ANASPAZ) disintergrating tablet 0.125 mg  0.125 mg Sublingual Q6H PRN Theadore Nan, NP        Review of Systems;  Patient denies headache, fevers, malaise, unintentional weight loss, skin rash, eye pain, sinus congestion and sinus pain,  sore throat, dysphagia,  hemoptysis , cough, dyspnea, wheezing, chest pain, palpitations, orthopnea, edema, abdominal pain, nausea, melena, diarrhea, constipation, flank pain, dysuria, hematuria, urinary  Frequency, nocturia, numbness, tingling, seizures,  Focal weakness, Loss of consciousness,  Tremor, insomnia, depression, anxiety, and suicidal ideation.      Objective:  BP 120/76   Pulse (!) 59   Ht 5\' 4"  (1.626 m)   Wt 176 lb 3.2 oz (79.9 kg)   SpO2 98%   BMI 30.24 kg/m   BP Readings from Last 3 Encounters:  11/28/22 120/76  09/29/21 122/74  05/21/21 127/80    Wt Readings from Last 3  Encounters:  11/28/22 176 lb 3.2 oz (79.9 kg)  09/29/21 157 lb 3.2 oz (71.3 kg)  05/17/21 160 lb (72.6 kg)    Physical Exam Vitals reviewed.  Constitutional:      General: She is not in acute distress.    Appearance: Normal appearance. She is normal weight. She is not ill-appearing, toxic-appearing or diaphoretic.  HENT:     Head: Normocephalic.  Eyes:     General: No scleral icterus.       Right eye: No discharge.        Left eye: No discharge.     Conjunctiva/sclera: Conjunctivae normal.  Cardiovascular:     Rate and Rhythm: Normal rate and regular rhythm.     Heart sounds: Normal heart sounds.  Pulmonary:     Effort: Pulmonary effort is normal. No respiratory distress.     Breath sounds: Normal breath sounds.  Musculoskeletal:        General: Normal range of motion.  Skin:    General: Skin is warm and dry.  Neurological:     General: No focal deficit present.     Mental Status: She is alert and oriented to person, place, and time. Mental status is at baseline.  Psychiatric:        Mood and Affect: Mood normal.        Behavior: Behavior normal.        Thought Content: Thought content normal.        Judgment: Judgment normal.    Lab Results  Component Value Date   HGBA1C 5.5 11/28/2022   HGBA1C 6.0 06/24/2020    Lab Results  Component Value Date   CREATININE 1.03 11/28/2022   CREATININE 0.82 05/20/2021   CREATININE 0.89 05/19/2021    Lab Results  Component Value Date   WBC 5.7 11/28/2022   HGB 14.8 11/28/2022   HCT 44.4 11/28/2022   PLT 245.0 11/28/2022   GLUCOSE 82 11/28/2022   CHOL 210 (H) 11/28/2022   TRIG 259.0 (H) 11/28/2022   HDL 55.70 11/28/2022   LDLDIRECT 135.0 11/28/2022   LDLCALC 102 (H) 11/28/2022   ALT 18 11/28/2022   AST 19 11/28/2022   NA 137 11/28/2022   K 3.8 11/28/2022   CL 97 11/28/2022   CREATININE 1.03 11/28/2022   BUN 14 11/28/2022   CO2 29 11/28/2022   TSH 3.99 11/28/2022   INR 0.96 10/25/2017   HGBA1C 5.5 11/28/2022    MICROALBUR <0.7 06/17/2020    MM 3D SCREEN BREAST W/IMPLANT BILATERAL  Result Date: 07/30/2021 CLINICAL DATA:  Screening. EXAM: DIGITAL SCREENING BILATERAL MAMMOGRAM WITH IMPLANTS, CAD AND TOMOSYNTHESIS TECHNIQUE: Bilateral screening digital craniocaudal and mediolateral oblique mammograms were obtained. Bilateral screening digital breast tomosynthesis was performed. The images were evaluated with computer-aided detection. Standard and/or implant displaced views were performed. COMPARISON:  Previous exam(s). ACR Breast Density Category b:  There are scattered areas of fibroglandular density. FINDINGS: The patient has retropectoral implants. There are no findings suspicious for malignancy. IMPRESSION: No mammographic evidence of malignancy. A result letter of this screening mammogram will be mailed directly to the patient. RECOMMENDATION: Screening mammogram in one year. (Code:SM-B-01Y) BI-RADS CATEGORY  1:  Negative. Electronically Signed   By: Sherron Ales M.D.   On: 07/30/2021 10:45    Assessment & Plan:  .Primary hypertension -     Comprehensive metabolic panel  Overweight Assessment & Plan: Significant reduction in  BMI with pharmacotherapy Greggory Keen) and regular participation in  exercise with 20 lb wt loss since initiation of therapy in June .  Weight gain since stopping mounjaro due to cost.  Advised her to increase metformin XR 500 mg starting dose to 1000 mg daily     Orders: -     Hemoglobin A1c -     CBC with Differential/Platelet -     TSH  High cholesterol -     Lipid panel -     LDL cholesterol, direct  History of total right hip replacement Assessment & Plan: She has persistent symptoms of popping and discomfort with flexion and abduction .     Major depressive disorder, recurrent episode, mild (HCC) Assessment & Plan: With loack of motivation, loss of libido, weight gain.  Repeat trial of wellbutrin starting dose 150 mg daily    Osteoarthritis of fingers of both  hands Assessment & Plan: Prescribing  celebrex  to use prn  if scheduled tylenol does not control pain    Other orders -     hydroCHLOROthiazide; Take 1 tablet (25 mg total) by mouth daily.  Dispense: 90 tablet; Refill: 1 -     metFORMIN HCl ER; Take 1 tablet (500 mg total) by mouth daily after supper.  Dispense: 90 tablet; Refill: 1 -     buPROPion HCl ER (XL); Take 1 tablet (150 mg total) by mouth daily with breakfast.  Dispense: 90 tablet; Refill: 1 -     Celecoxib; Take 1 capsule (200 mg total) by mouth daily.  Dispense: 90 capsule; Refill: 1     I provided 30 minutes of face-to-face time during this encounter reviewing patient's last visit with me,  recent surgical and non surgical procedures, previous  labs and imaging studies, counseling on currently addressed issues,  and post visit ordering to diagnostics and therapeutics .   Follow-up: Return in about 6 months (around 05/28/2023).   Sherlene Shams, MD

## 2022-11-28 NOTE — Assessment & Plan Note (Signed)
Significant reduction in  BMI with pharmacotherapy Greggory Keen) and regular participation in  exercise with 20 lb wt loss since initiation of therapy in June .  Weight gain since stopping mounjaro due to cost.  Advised her to increase metformin XR 500 mg starting dose to 1000 mg daily

## 2022-11-29 DIAGNOSIS — N898 Other specified noninflammatory disorders of vagina: Secondary | ICD-10-CM | POA: Diagnosis not present

## 2022-11-29 DIAGNOSIS — R6882 Decreased libido: Secondary | ICD-10-CM | POA: Diagnosis not present

## 2022-11-29 DIAGNOSIS — Z79890 Hormone replacement therapy (postmenopausal): Secondary | ICD-10-CM | POA: Diagnosis not present

## 2022-11-29 DIAGNOSIS — N951 Menopausal and female climacteric states: Secondary | ICD-10-CM | POA: Diagnosis not present

## 2022-11-29 DIAGNOSIS — Z6829 Body mass index (BMI) 29.0-29.9, adult: Secondary | ICD-10-CM | POA: Diagnosis not present

## 2022-12-21 ENCOUNTER — Telehealth: Payer: Self-pay | Admitting: Internal Medicine

## 2022-12-21 MED ORDER — BUPROPION HCL ER (XL) 300 MG PO TB24: 300.0000 mg | ORAL_TABLET | Freq: Every day | ORAL | 1 refills | Status: AC

## 2022-12-21 NOTE — Telephone Encounter (Signed)
Patient just called and said she just started taking 2 tablets and now she is out of her medication. The name is buPROPion (WELLBUTRIN XL) 150 MG 24 hr tablet. The pharmacy she uses is [facility] [address] [place] - [address] [place] [address] Phone: [phone]  Fax: [phone]  Her number is [phone]

## 2022-12-21 NOTE — Telephone Encounter (Signed)
LMTCB to let pt know that the new rx has been sent in.

## 2022-12-21 NOTE — Telephone Encounter (Signed)
Pt has increased the Wellbutrin to tablets daily. Is it okay to send in a new script with directions to take two tablets daily?

## 2022-12-22 NOTE — Telephone Encounter (Signed)
LMTCB

## 2023-01-17 ENCOUNTER — Other Ambulatory Visit: Payer: Self-pay | Admitting: Internal Medicine

## 2023-02-14 ENCOUNTER — Encounter: Payer: Self-pay | Admitting: Internal Medicine

## 2023-02-14 ENCOUNTER — Ambulatory Visit: Payer: 59 | Admitting: Internal Medicine

## 2023-02-14 VITALS — BP 158/98 | HR 69 | Ht 64.0 in | Wt 175.0 lb

## 2023-02-14 DIAGNOSIS — F330 Major depressive disorder, recurrent, mild: Secondary | ICD-10-CM

## 2023-02-14 DIAGNOSIS — N644 Mastodynia: Secondary | ICD-10-CM | POA: Insufficient documentation

## 2023-02-14 DIAGNOSIS — R41840 Attention and concentration deficit: Secondary | ICD-10-CM

## 2023-02-14 NOTE — Assessment & Plan Note (Signed)
Ddx includes rupture of retropectoral  breast implant.  Diagnostic mammogram ordered

## 2023-02-14 NOTE — Patient Instructions (Addendum)
The tremor may be due to the wellbutrin.  I recommend that you Reduce wellbutrin to 150 mg daily for the holidays,  then after the holidays reduce to every other day for one week then stop   Referral for ADD testing in progress   Diagnostic mammogram has been  ordered   Extend the vaginal lubrication to inside your vault.  The friction may be the problem!

## 2023-02-14 NOTE — Progress Notes (Signed)
Subjective:  Patient ID: Paula Moses, female    DOB: 04-30-1965  Age: 57 y.o. MRN: [id number]  CC: The primary encounter diagnosis was Major depressive disorder, recurrent episode, mild (HCC). Diagnoses of Breast pain, left and Concentration deficit were also pertinent to this visit.   HPI Paula Moses presents for  Chief Complaint  Patient presents with   left breast pain    1)  2 week history of pain under left breast that was somewhat improved with supporting the breast.  Felt bruised .  Exercises but no history of pain under breast,  no trauma. No bruising was noticed  but thought the area felt warmer than the right side  seen .associated with a possible lump.    Resolved spontaneously on Saturday.  History of rectopectoral implants  last screening mammogram May 2023  does not wear a bra , only an occasional sports bra.   2) intention tremor noted since starting Wellutrin , tried the  300 mg dose for a week, then reduced dose to 150 mg daily  for the the past 3 weeks, then increase  the dose to 300 mg for the past 10 days due to lack of motivation .  ms feel shaky and weak,  but no change in workout strength.     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 90 tablet 1   celecoxib (CELEBREX) 200 MG capsule Take 1 capsule (200 mg total) by mouth daily. 90 capsule 1   fluticasone (FLONASE) 50 MCG/ACT nasal spray Place 2 sprays into both nostrils daily. 16 g 0   hydrochlorothiazide (HYDRODIURIL) 25 MG tablet Take 1 tablet (25 mg total) by mouth daily. 90 tablet 1   montelukast (SINGULAIR) 10 MG tablet TAKE 1 TABLET BY MOUTH AT BEDTIME 90 tablet 1   omeprazole (PRILOSEC) 40 MG capsule Take 1 capsule (40 mg total) by mouth daily as needed (Heartburn). 90  capsule 1   PRESCRIPTION MEDICATION Testosterone and estrogen pellet implants in buttocks     tretinoin (RETIN-A) 0.05 % cream Apply 1 Application topically at bedtime.     metFORMIN (GLUCOPHAGE-XR) 500 MG 24 hr tablet Take 1 tablet (500 mg total) by mouth daily after supper. (Patient not taking: Reported on 02/14/2023) 90 tablet 1   Facility-Administered Medications Prior to Visit  Medication Dose Route Frequency Provider Last Rate Last Admin   hyoscyamine (ANASPAZ) disintergrating tablet 0.125 mg  0.125 mg Sublingual Q6H PRN Theadore Nan, NP        Review of Systems;  Patient denies headache, fevers, malaise, unintentional weight loss, skin rash, eye pain, sinus congestion and sinus pain, sore throat, dysphagia,  hemoptysis , cough, dyspnea, wheezing, chest pain, palpitations, orthopnea, edema, abdominal pain, nausea, melena, diarrhea, constipation, flank pain, dysuria, hematuria, urinary  Frequency, nocturia, numbness, tingling, seizures,  Focal weakness, Loss of consciousness,  Tremor, insomnia, depression, anxiety, and suicidal ideation.      Objective:  BP (!) 158/98   Pulse 69   Ht 5\' 4"  (1.626 m)   Wt 175 lb (79.4 kg)   SpO2 95%   BMI 30.04 kg/m   BP Readings from Last 3 Encounters:  02/14/23 (!) 158/98  11/28/22 120/76  09/29/21 122/74  Wt Readings from Last 3 Encounters:  02/14/23 175 lb (79.4 kg)  11/28/22 176 lb 3.2 oz (79.9 kg)  09/29/21 157 lb 3.2 oz (71.3 kg)    Physical Exam  Lab Results  Component Value Date   HGBA1C 5.5 11/28/2022   HGBA1C 6.0 06/24/2020    Lab Results  Component Value Date   CREATININE 1.03 11/28/2022   CREATININE 0.82 05/20/2021   CREATININE 0.89 05/19/2021    Lab Results  Component Value Date   WBC 5.7 11/28/2022   HGB 14.8 11/28/2022   HCT 44.4 11/28/2022   PLT 245.0 11/28/2022   GLUCOSE 82 11/28/2022   CHOL 210 (H) 11/28/2022   TRIG 259.0 (H) 11/28/2022   HDL 55.70 11/28/2022   LDLDIRECT 135.0 11/28/2022    LDLCALC 102 (H) 11/28/2022   ALT 18 11/28/2022   AST 19 11/28/2022   NA 137 11/28/2022   K 3.8 11/28/2022   CL 97 11/28/2022   CREATININE 1.03 11/28/2022   BUN 14 11/28/2022   CO2 29 11/28/2022   TSH 3.99 11/28/2022   INR 0.96 10/25/2017   HGBA1C 5.5 11/28/2022   MICROALBUR <0.7 06/17/2020    MM 3D SCREEN BREAST W/IMPLANT BILATERAL  Result Date: 07/30/2021 CLINICAL DATA:  Screening. EXAM: DIGITAL SCREENING BILATERAL MAMMOGRAM WITH IMPLANTS, CAD AND TOMOSYNTHESIS TECHNIQUE: Bilateral screening digital craniocaudal and mediolateral oblique mammograms were obtained. Bilateral screening digital breast tomosynthesis was performed. The images were evaluated with computer-aided detection. Standard and/or implant displaced views were performed. COMPARISON:  Previous exam(s). ACR Breast Density Category b: There are scattered areas of fibroglandular density. FINDINGS: The patient has retropectoral implants. There are no findings suspicious for malignancy. IMPRESSION: No mammographic evidence of malignancy. A result letter of this screening mammogram will be mailed directly to the patient. RECOMMENDATION: Screening mammogram in one year. (Code:SM-B-01Y) BI-RADS CATEGORY  1:  Negative. Electronically Signed   By: Sherron Ales M.D.   On: 07/30/2021 10:45    Assessment & Plan:  .Major depressive disorder, recurrent episode, mild (HCC) Assessment & Plan: Primary symptoms were lack of motivation, loss of libido, weight gain  she is facing a CR[place]roads in her career,    not depressed . Advised to  Wean wellbutrin due to new onset tremor of hands    Breast pain, left Assessment & Plan: Ddx includes rupture of retropectoral  breast implant.  Diagnostic mammogram ordered   Orders: -     MM 3D DIAGNOSTIC MAMMOGRAM BILATERAL BREAST; Future -     [place] LIMITED ULTRASOUND INCLUDING AXILLA LEFT BREAST ; Future  Concentration deficit -     Ambulatory referral to Psychology     I provided 30 minutes of  face-to-face time during this encounter reviewing patient's last visit with me, patient's  most recent visit with cardiology,  nephrology,  and neurology,  recent surgical and non surgical procedures, previous  labs and imaging studies, counseling on currently addressed issues,  and post visit ordering to diagnostics and therapeutics .   Follow-up: Return in about 3 months (around 05/15/2023).   Sherlene Shams, MD

## 2023-02-14 NOTE — Assessment & Plan Note (Addendum)
Primary symptoms were lack of motivation, loss of libido, weight gain  she is facing a CR[place]roads in her career,    not depressed . Advised to  Wean wellbutrin due to new onset tremor of hands

## 2023-02-15 ENCOUNTER — Other Ambulatory Visit: Payer: Self-pay | Admitting: Internal Medicine

## 2023-02-15 DIAGNOSIS — Z1231 Encounter for screening mammogram for malignant neoplasm of breast: Secondary | ICD-10-CM

## 2023-02-15 DIAGNOSIS — F330 Major depressive disorder, recurrent, mild: Secondary | ICD-10-CM

## 2023-02-15 DIAGNOSIS — N644 Mastodynia: Secondary | ICD-10-CM

## 2023-02-15 DIAGNOSIS — R41840 Attention and concentration deficit: Secondary | ICD-10-CM

## 2023-02-24 ENCOUNTER — Encounter: Payer: Self-pay | Admitting: Internal Medicine

## 2023-02-24 DIAGNOSIS — Z79890 Hormone replacement therapy (postmenopausal): Secondary | ICD-10-CM | POA: Diagnosis not present

## 2023-02-24 DIAGNOSIS — N898 Other specified noninflammatory disorders of vagina: Secondary | ICD-10-CM | POA: Diagnosis not present

## 2023-02-24 DIAGNOSIS — N951 Menopausal and female climacteric states: Secondary | ICD-10-CM | POA: Diagnosis not present

## 2023-02-24 DIAGNOSIS — R6882 Decreased libido: Secondary | ICD-10-CM | POA: Diagnosis not present

## 2023-02-27 DIAGNOSIS — Z79890 Hormone replacement therapy (postmenopausal): Secondary | ICD-10-CM | POA: Diagnosis not present

## 2023-02-27 DIAGNOSIS — N951 Menopausal and female climacteric states: Secondary | ICD-10-CM | POA: Diagnosis not present

## 2023-02-27 DIAGNOSIS — R5383 Other fatigue: Secondary | ICD-10-CM | POA: Diagnosis not present

## 2023-02-27 DIAGNOSIS — R6882 Decreased libido: Secondary | ICD-10-CM | POA: Diagnosis not present

## 2023-02-27 DIAGNOSIS — Z6830 Body mass index (BMI) 30.0-30.9, adult: Secondary | ICD-10-CM | POA: Diagnosis not present

## 2023-03-03 ENCOUNTER — Telehealth: Payer: Self-pay

## 2023-03-03 ENCOUNTER — Ambulatory Visit: Payer: 59 | Admitting: Physical Medicine and Rehabilitation

## 2023-03-03 DIAGNOSIS — G8929 Other chronic pain: Secondary | ICD-10-CM | POA: Diagnosis not present

## 2023-03-03 DIAGNOSIS — M7918 Myalgia, other site: Secondary | ICD-10-CM

## 2023-03-03 DIAGNOSIS — M5412 Radiculopathy, cervical region: Secondary | ICD-10-CM

## 2023-03-03 DIAGNOSIS — M5416 Radiculopathy, lumbar region: Secondary | ICD-10-CM

## 2023-03-03 DIAGNOSIS — M5441 Lumbago with sciatica, right side: Secondary | ICD-10-CM

## 2023-03-03 DIAGNOSIS — M5442 Lumbago with sciatica, left side: Secondary | ICD-10-CM | POA: Diagnosis not present

## 2023-03-03 NOTE — Telephone Encounter (Signed)
Copied from CRM [id number]. Topic: Clinical - Medical Advice >> Mar 03, 2023  2:02 PM Theodis Sato wrote: Reason for CRM: PT is requesting to speak with a nurse regarding the referral for her MRI.

## 2023-03-03 NOTE — Progress Notes (Unsigned)
Paula Moses - 57 y.o. female MRN [id number]  Date of birth: 03/26/1965  Office Visit Note: Visit Date: 03/03/2023 PCP: Sherlene Shams, MD Referred by: Sherlene Shams, MD  Subjective: Chief Complaint  Patient presents with   Neck - Pain   HPI: AYSA Moses is a 57 y.o. female who comes in today as a self referral for evaluation of chronic, worsening and severe bilateral neck pain radiating up to head, shoulders and tingling sensation to right hand. Also reports feeling of weakness to right arm and aching to bilateral shoulders and arms. Pain ongoing for several years, worsens with movement and activity. She describes pain as sore, aching and sharp sensation, currently rates as 5 out of 10. Some relief of pain with home exercise regimen, rest and use of medications. History of chiropractic treatments in the past with minimal relief of pain. Some relief of pain with intermittent massage therapy. Cervical MRI imaging from 2015 exhibits broad-based central disc herniation C6-C7. No history of cervical surgery/injections.   Also reports chronic, worsening and severe bilateral lower back pain radiating to buttocks and down both legs. Pain ongoing for several years, worsens with prolonged sitting. She describes as aching and annoying sensation, currently rates as 5 out of 10. Some relief of pain with home exercise regimen, rest and use of medications. Lumbar MRI imaging from 2019 shows grade 1 anterolisthesis of L5 on S1 with associated bilateral pars defects, multi level foraminal narrowing, severe at L4-L5 on the left. She was previously treated by Dr. Merri Ray with [facility], she underwent multiple multiple epidural steroid injections with significant relief of pain, most recent was bilateral S1 transforaminal epidural steroid injection on 12/29/2017, she reports 100% relief of pain for several years.   She is here today requesting treatment for both cervical and lumbar issues. She  is currently working as Secondary school teacher. She is treated by Dr. Doneen Poisson, history of bilateral total hip arthroplasty. Patient denies focal weakness. No recent trauma or falls      Review of Systems  Musculoskeletal:  Positive for back pain, myalgias and neck pain.  Neurological:  Positive for tingling. Negative for focal weakness and weakness.  All other systems reviewed and are negative.  Otherwise per HPI.  Assessment & Plan: Visit Diagnoses: No diagnosis found.   Plan: Findings:  1. Chronic, worsening and severe     Meds & Orders: No orders of the defined types were placed in this encounter.  No orders of the defined types were placed in this encounter.   Follow-up: No follow-ups on file.   Procedures: No procedures performed      Clinical History: No specialty comments available.   She reports that she has never smoked. She has never used smokeless tobacco.  Recent Labs    11/28/22 1159  HGBA1C 5.5    Objective:  VS:  HT:    WT:   BMI:     BP:   HR: bpm  TEMP: ( )  RESP:  Physical Exam  Ortho Exam  Imaging: No results found.  Past Medical/Family/Surgical/Social History: Medications & Allergies reviewed per EMR, new medications updated. Patient Active Problem List   Diagnosis Date Noted   Breast pain, left 02/14/2023   Major depressive disorder, recurrent episode, mild (HCC) 11/28/2022   H/O breast augmentation 10/01/2021   Diverticulitis 05/17/2021   Dyspareunia in female 03/16/2021   Breast cancer screening by mammogram 03/16/2021   Diverticulosis large intestine w/o perforation or abscess w/o  bleeding 12/05/2020   GERD without esophagitis 12/05/2020   Hospital discharge follow-up 09/26/2020   Concentration deficit 06/24/2020   Atherosclerosis of iliac artery 06/24/2020   Posterior knee pain, right 06/24/2020   History of blood in urine 09/04/2019   Irritable bowel syndrome 09/04/2019   Internal bleeding hemorrhoids 06/20/2019   Spinal  stenosis of lumbar region without neurogenic claudication 02/28/2019   Overweight 02/28/2019   History of total right hip replacement 02/25/2019   Screening for colon cancer    Rectal polyp    Polyp of colon    Osteoarthritis of fingers of both hands 12/21/2018   Constipation 12/12/2018   Status post total replacement of left hip 03/30/2018   Unilateral primary osteoarthritis, left hip 01/22/2018   Arthritis of left hip 12/20/2017   Degenerative disc disease, lumbar 12/20/2017   Status post total hip replacement, right 10/26/2017   Lumbar spondylosis 02/13/2017   Surgical menopause 01/26/2017   Status post vaginal hysterectomy BSO 01/26/2017   Hypertension    High cholesterol    Fibroids    Trigeminal neuralgia    Neck pain on left side 07/16/2013   Paresthesia 07/16/2013   Past Medical History:  Diagnosis Date   Abnormal Pap smear of cervix    Abnormal vaginal bleeding    Anxiety    Arthritis    neck and body   Complication of anesthesia    IT TAKES ALOT OF ANESTHESIA TO KEEP PT SEDATED   Cyst of vulva    Dyspareunia    External hemorrhoid    Fibroids    GERD (gastroesophageal reflux disease)    Headache 2019   migraines, becoming more frequent   High cholesterol    History of UTI    Hypertension    Left-sided face pain    Neck pain    Paresthesia    Rectocele    Surgical menopause    Trigeminal neuralgia    Uterus prolapse    Family History  Problem Relation Age of Onset   Brain cancer Mother    Dementia Father    Breast cancer Neg Hx    Past Surgical History:  Procedure Laterality Date   AUGMENTATION MAMMAPLASTY Bilateral 2006   BREAST ENHANCEMENT SURGERY     CHOLECYSTECTOMY     COLONOSCOPY WITH PROPOFOL N/A 01/31/2019   Procedure: COLONOSCOPY WITH PROPOFOL;  Surgeon: Pasty Spillers, MD;  Location: [facility];  Service: Endoscopy;  Laterality: N/A;  2 day prep    HIP ARTHROSCOPY Right 05/05/2017   Procedure: ARTHROSCOPY HIP;  Surgeon: Signa Kell, MD;  Location: [facility];  Service: Orthopedics;  Laterality: Right;   LAPAROSCOPY     X2   TOTAL HIP ARTHROPLASTY Right 10/26/2017   Procedure: TOTAL HIP ARTHROPLASTY ANTERIOR APPROACH;  Surgeon: Kennedy Bucker, MD;  Location: [facility];  Service: Orthopedics;  Laterality: Right;   TOTAL HIP ARTHROPLASTY Left 03/30/2018   Procedure: LEFT TOTAL HIP ARTHROPLASTY ANTERIOR APPROACH;  Surgeon: Kathryne Hitch, MD;  Location: [facility];  Service: Orthopedics;  Laterality: Left;   tummy tuck      VAGINAL HYSTERECTOMY     lahv bso   Social History   Occupational History   Occupation: Veterinary surgeon  Tobacco Use   Smoking status: Never   Smokeless tobacco: Never  Vaping Use   Vaping status: Never Used  Substance and Sexual Activity   Alcohol use: Yes    Comment: occ   Drug use: No   Sexual activity: Yes  Birth control/protection: Surgical

## 2023-03-04 ENCOUNTER — Other Ambulatory Visit: Payer: Self-pay | Admitting: Internal Medicine

## 2023-03-04 DIAGNOSIS — M542 Cervicalgia: Secondary | ICD-10-CM

## 2023-03-04 DIAGNOSIS — R202 Paresthesia of skin: Secondary | ICD-10-CM

## 2023-03-05 ENCOUNTER — Encounter: Payer: Self-pay | Admitting: Physical Medicine and Rehabilitation

## 2023-03-06 ENCOUNTER — Ambulatory Visit
Admission: RE | Admit: 2023-03-06 | Discharge: 2023-03-06 | Disposition: A | Discharge status: Home / Self Care | Payer: 59 | Source: Ambulatory Visit | Attending: Internal Medicine | Admitting: Internal Medicine

## 2023-03-06 DIAGNOSIS — Z1231 Encounter for screening mammogram for malignant neoplasm of breast: Secondary | ICD-10-CM | POA: Insufficient documentation

## 2023-03-06 DIAGNOSIS — N644 Mastodynia: Secondary | ICD-10-CM | POA: Diagnosis not present

## 2023-03-06 DIAGNOSIS — N6323 Unspecified lump in the left breast, lower outer quadrant: Secondary | ICD-10-CM | POA: Diagnosis not present

## 2023-03-06 DIAGNOSIS — R92323 Mammographic fibroglandular density, bilateral breasts: Secondary | ICD-10-CM | POA: Diagnosis not present

## 2023-03-06 NOTE — Telephone Encounter (Signed)
LMTCB

## 2023-03-13 DIAGNOSIS — M5412 Radiculopathy, cervical region: Secondary | ICD-10-CM | POA: Diagnosis not present

## 2023-03-13 DIAGNOSIS — M542 Cervicalgia: Secondary | ICD-10-CM | POA: Diagnosis not present

## 2023-03-20 DIAGNOSIS — M5412 Radiculopathy, cervical region: Secondary | ICD-10-CM | POA: Diagnosis not present

## 2023-03-20 DIAGNOSIS — M542 Cervicalgia: Secondary | ICD-10-CM | POA: Diagnosis not present

## 2023-03-23 ENCOUNTER — Other Ambulatory Visit: Payer: 59

## 2023-04-12 ENCOUNTER — Telehealth: Payer: Self-pay | Admitting: Physical Medicine and Rehabilitation

## 2023-04-12 NOTE — Telephone Encounter (Signed)
Patient continues with physician directed home exercise regimen, rest and use of medications for chronic neck pain. She is currently undergoing formal physical therapy/dry needling treatments for her cervical spine with our in house team. She reports minimal relief of pain with these treatments. She has completed at least 6 weeks of conservative therapies.   She also continues with physician directed home exercise regimen, rest and use of medications for chronic lower back pain. She was previously treated many years ago by Dr. Merri Ray, she underwent multiple lumbar epidural steroid injections with him that provided significant relief of pain.   We have discussed medication management, unfortunately she does have multiple allergies to medications. She continues with Tylenol at home with minimal relief of pain.  Given chronicity of her symptoms and continued severe pain despite good conservative therapies we would like to obtain new cervical and lumbar MRI imaging as we do anticipate the need for further interventional procedures.

## 2023-05-02 ENCOUNTER — Encounter: Payer: Self-pay | Admitting: Physical Medicine and Rehabilitation

## 2023-05-06 ENCOUNTER — Ambulatory Visit
Admission: RE | Admit: 2023-05-06 | Discharge: 2023-05-06 | Disposition: A | Discharge status: Home / Self Care | Payer: 59 | Source: Ambulatory Visit | Attending: Physical Medicine and Rehabilitation | Admitting: Physical Medicine and Rehabilitation

## 2023-05-06 DIAGNOSIS — M5441 Lumbago with sciatica, right side: Secondary | ICD-10-CM | POA: Diagnosis not present

## 2023-05-06 DIAGNOSIS — G8929 Other chronic pain: Secondary | ICD-10-CM

## 2023-05-06 DIAGNOSIS — M7918 Myalgia, other site: Secondary | ICD-10-CM | POA: Diagnosis not present

## 2023-05-06 DIAGNOSIS — M5416 Radiculopathy, lumbar region: Secondary | ICD-10-CM | POA: Diagnosis not present

## 2023-05-06 DIAGNOSIS — M5412 Radiculopathy, cervical region: Secondary | ICD-10-CM | POA: Diagnosis not present

## 2023-05-06 DIAGNOSIS — M5442 Lumbago with sciatica, left side: Secondary | ICD-10-CM | POA: Diagnosis not present

## 2023-05-16 ENCOUNTER — Ambulatory Visit: Payer: 59 | Admitting: Physical Medicine and Rehabilitation

## 2023-05-16 DIAGNOSIS — G8929 Other chronic pain: Secondary | ICD-10-CM

## 2023-05-16 DIAGNOSIS — M5442 Lumbago with sciatica, left side: Secondary | ICD-10-CM | POA: Diagnosis not present

## 2023-05-16 DIAGNOSIS — M4802 Spinal stenosis, cervical region: Secondary | ICD-10-CM | POA: Diagnosis not present

## 2023-05-16 DIAGNOSIS — M5441 Lumbago with sciatica, right side: Secondary | ICD-10-CM

## 2023-05-16 DIAGNOSIS — M7918 Myalgia, other site: Secondary | ICD-10-CM

## 2023-05-16 DIAGNOSIS — M5416 Radiculopathy, lumbar region: Secondary | ICD-10-CM | POA: Diagnosis not present

## 2023-05-16 DIAGNOSIS — M47816 Spondylosis without myelopathy or radiculopathy, lumbar region: Secondary | ICD-10-CM | POA: Diagnosis not present

## 2023-05-16 DIAGNOSIS — M4316 Spondylolisthesis, lumbar region: Secondary | ICD-10-CM

## 2023-05-16 DIAGNOSIS — M47812 Spondylosis without myelopathy or radiculopathy, cervical region: Secondary | ICD-10-CM | POA: Diagnosis not present

## 2023-05-16 DIAGNOSIS — M5412 Radiculopathy, cervical region: Secondary | ICD-10-CM

## 2023-05-16 MED ORDER — CELECOXIB 200 MG PO CAPS: 200.0000 mg | ORAL_CAPSULE | Freq: Two times a day (BID) | ORAL | 0 refills | Status: DC

## 2023-05-16 NOTE — Progress Notes (Signed)
 Paula Moses - 58 y.o. female MRN [id number]  Date of birth: Jul 06, 1965  Office Visit Note: Visit Date: 05/16/2023 PCP: Sherlene Shams, MD Referred by: Sherlene Shams, MD  Subjective: Chief Complaint  Patient presents with   Neck - Pain   Lower Back - Pain   HPI: Paula Moses is a 58 y.o. female who comes in today for evaluation of 2 problems, cervical and lumbar spine issues. Chronic, worsening and severe bilateral neck pain radiating up to head, shoulders and tingling sensation to right arm and hand. Symptoms ongoing for several years and are more constant. She describes pain as sore, aching and sharp sensation, currently rates as 8 out of 10. Some relief of pain with home exercise regimen, rest and use of medications. History of chiropractic treatments in the past with minimal relief of pain. Some relief of pain with intermittent massage therapy. She recently had a friend perform dry needling treatments on her neck that were painful and caused increased pain for several days. Recent cervical MRI imaging shows multilevel foraminal stenosis, greatest and severe on the right at C3-C4. There is also bone marrow edema involving the right facets at C3-C4 likely related to facet arthrosis. There is mild-to-moderate spinal canal stenosis. Mild flattening of the ventral cord at C4-5 without cord signal abnormality. No high grade spine canal stenosis noted.   Chronic bilateral lower back pain radiating to buttocks and down both legs. Pain ongoing for several years. Her lower back pain seems to be more intermittent, states her neck pain is much more severe at this time. Pain ongoing for several years, worsens with prolonged sitting. She describes as aching and annoying sensation, currently rates as 3 out of 10. Does reports numbness/tinging to left foot. Some relief of pain with home exercise regimen, rest and use of medications. Recent lumbar MRI imaging exhibits retrolisthesis, and facet arthrosis  contributing to severe foraminal stenosis on the left at L4-5. Moderate foraminal stenosis on the right at L4-L5. There is also grade 2 anterolisthesis of L5 on S1. There is moderate to severe facet arthropathy and moderate bilateral foraminal stenosis at this level. No high grade spinal canal stenosis noted. History of intermittent lumbar epidural steroid injections with Dr. Yves Dill that were beneficial in alleviating her pain.  She is currently working as Secondary school teacher. She is treated by Dr. Doneen Poisson from orthopedic standpoint, history of bilateral total hip arthroplasty.          Review of Systems  Musculoskeletal:  Positive for back pain, myalgias and neck pain.  Neurological:  Positive for tingling. Negative for weakness.  All other systems reviewed and are negative.  Otherwise per HPI.  Assessment & Plan: Visit Diagnoses:    ICD-10-CM   1. Radiculopathy, cervical region  M54.12 Ambulatory referral to Physical Medicine Rehab    2. Facet arthropathy, cervical  M47.812 Ambulatory referral to Physical Medicine Rehab    3. Foraminal stenosis of cervical region  M48.02 Ambulatory referral to Physical Medicine Rehab    4. Myofascial pain syndrome  M79.18 Ambulatory referral to Physical Medicine Rehab    5. Chronic bilateral low back pain with bilateral sciatica  M54.42    M54.41    G89.29     6. Lumbar radiculopathy  M54.16     7. Spondylolisthesis, lumbar region  M43.16     8. Facet arthropathy, lumbar  M47.816        Plan: Findings:  1. Chronic, worsening and severe bilateral neck pain  radiating up to head, shoulders and tingling sensation to right arm and hand. Patient continues to have severe pain despite good conservative therapies such as dry needling, home exercise regimen, rest and use of medications. I discussed recent cervical MRI with her today using imaging and spine model. Patients clinical presentation and exam are complex, her symptoms fit both cervical  radiculopathy and cervical facet mediated pain. She has multi level findings on recent cervical MRI imaging that could cause her pain. No severe spinal canal stenosis noted. We discussed treatment plan in detail today, next step is to perform diagnostic and hopefully therapeutic right C7-T1 interlaminar epidural steroid injection under fluoroscopic guidance. If good relief of pain with injection we can repeat this procedure infrequently as needed. I discussed injection procedure with her detail today, she has no questions at this time. I also discussed re-grouping with physical therapy to address chronic neck issues and perhaps to try dry needling again with one of our in house specialists. We discussed medication management, I increased her Celebrex to 200 mg BID and provided her with refill. We will see her back for injection. No red flag symptoms noted upon exam today.   2. Chronic bilateral lower back pain radiating to buttocks and down both legs. Neck pain seems to be biggest pain generator for her at this time. Her lower back pain is manageable, she continues with home exercise regimen, rest and use of medications. I discussed recent lumbar MRI with her today using imaging and spine model. Patients clinical presentation and exam are complex, differentials include lumbar radiculopathy and lumbar facet mediated pain. There are multi level findings in her lumbar spine that could cause her pain. Upon independent review, there appears to be more of a grade 1 anterolisthesis of L5 on S1. No severe spinal canal stenosis noted. Would consider performing lumbar epidural steroid injection if her pain continues. For now, we will move forward with neck injection. No red flag symptoms noted.   Of note, would consider referral to our spine surgeon Dr. Willia Craze to discuss options. She would also be good candidate for possible spinal cord stimulator placement.     Meds & Orders:  Meds ordered this encounter   Medications   celecoxib (CELEBREX) 200 MG capsule    Sig: Take 1 capsule (200 mg total) by mouth 2 (two) times daily.    Dispense:  180 capsule    Refill:  0    Orders Placed This Encounter  Procedures   Ambulatory referral to Physical Medicine Rehab    Follow-up: Return for Right C7-T1 interlaminar epidural steroid injection.   Procedures: No procedures performed      Clinical History: No specialty comments available.   She reports that she has never smoked. She has never used smokeless tobacco.  Recent Labs    11/28/22 1159  HGBA1C 5.5    Objective:  VS:  HT:    WT:   BMI:     BP:   HR: bpm  TEMP: ( )  RESP:  Physical Exam Vitals and nursing note reviewed.  HENT:     Head: Normocephalic and atraumatic.     Right Ear: External ear normal.     Left Ear: External ear normal.     Nose: Nose normal.     Mouth/Throat:     Mouth: Mucous membranes are moist.  Eyes:     Extraocular Movements: Extraocular movements intact.  Cardiovascular:     Rate and Rhythm: Normal rate.  Pulses: Normal pulses.  Pulmonary:     Effort: Pulmonary effort is normal.  Abdominal:     General: Abdomen is flat. There is no distension.  Musculoskeletal:        General: Tenderness present.     Cervical back: Tenderness present.     Comments: No discomfort noted with flexion, extension and side-to-side rotation. Patient has good strength in the upper extremities including 5 out of 5 strength in wrist extension, long finger flexion and APB. Shoulder range of motion is full bilaterally without any sign of impingement. There is no atrophy of the hands intrinsically. Sensation intact bilaterally. Myofascial tenderness noted to right levator scapulae and trapezius regions upon palpation. Negative Hoffman's sign. Negative Spurling's sign.   Patient rises from seated position to standing without difficulty. Good lumbar range of motion. No pain noted with facet loading. 5/5 strength noted with  bilateral hip flexion, knee flexion/extension, ankle dorsiflexion/plantarflexion and EHL. No clonus noted bilaterally. No pain upon palpation of greater trochanters. No pain with internal/external rotation of bilateral hips. Sensation intact bilaterally. Negative slump test bilaterally. Ambulates without aid, gait steady.     Skin:    General: Skin is warm and dry.     Capillary Refill: Capillary refill takes less than 2 seconds.  Neurological:     General: No focal deficit present.     Mental Status: She is alert and oriented to person, place, and time.  Psychiatric:        Mood and Affect: Mood normal.        Behavior: Behavior normal.     Ortho Exam  Imaging: No results found.  Past Medical/Family/Surgical/Social History: Medications & Allergies reviewed per EMR, new medications updated. Patient Active Problem List   Diagnosis Date Noted   Breast pain, left 02/14/2023   Major depressive disorder, recurrent episode, mild (HCC) 11/28/2022   H/O breast augmentation 10/01/2021   Diverticulitis 05/17/2021   Dyspareunia in female 03/16/2021   Breast cancer screening by mammogram 03/16/2021   Diverticulosis large intestine w/o perforation or abscess w/o bleeding 12/05/2020   GERD without esophagitis 12/05/2020   Hospital discharge follow-up 09/26/2020   Concentration deficit 06/24/2020   Atherosclerosis of iliac artery 06/24/2020   Posterior knee pain, right 06/24/2020   History of blood in urine 09/04/2019   Irritable bowel syndrome 09/04/2019   Internal bleeding hemorrhoids 06/20/2019   Spinal stenosis of lumbar region without neurogenic claudication 02/28/2019   Overweight 02/28/2019   History of total right hip replacement 02/25/2019   Screening for colon cancer    Rectal polyp    Polyp of colon    Osteoarthritis of fingers of both hands 12/21/2018   Constipation 12/12/2018   Status post total replacement of left hip 03/30/2018   Unilateral primary osteoarthritis, left  hip 01/22/2018   Arthritis of left hip 12/20/2017   Degenerative disc disease, lumbar 12/20/2017   Status post total hip replacement, right 10/26/2017   Lumbar spondylosis 02/13/2017   Surgical menopause 01/26/2017   Status post vaginal hysterectomy BSO 01/26/2017   Hypertension    High cholesterol    Fibroids    Trigeminal neuralgia    Neck pain on left side 07/16/2013   Paresthesia 07/16/2013   Past Medical History:  Diagnosis Date   Abnormal Pap smear of cervix    Abnormal vaginal bleeding    Anxiety    Arthritis    neck and body   Complication of anesthesia    IT TAKES ALOT OF ANESTHESIA TO  KEEP PT SEDATED   Cyst of vulva    Dyspareunia    External hemorrhoid    Fibroids    GERD (gastroesophageal reflux disease)    Headache 2019   migraines, becoming more frequent   High cholesterol    History of UTI    Hypertension    Left-sided face pain    Neck pain    Paresthesia    Rectocele    Surgical menopause    Trigeminal neuralgia    Uterus prolapse    Family History  Problem Relation Age of Onset   Brain cancer Mother    Dementia Father    Breast cancer Neg Hx    Past Surgical History:  Procedure Laterality Date   AUGMENTATION MAMMAPLASTY Bilateral 2006   BREAST ENHANCEMENT SURGERY     CHOLECYSTECTOMY     COLONOSCOPY WITH PROPOFOL N/A 01/31/2019   Procedure: COLONOSCOPY WITH PROPOFOL;  Surgeon: Pasty Spillers, MD;  Location: [facility];  Service: Endoscopy;  Laterality: N/A;  2 day prep    HIP ARTHROSCOPY Right 05/05/2017   Procedure: ARTHROSCOPY HIP;  Surgeon: Signa Kell, MD;  Location: [facility];  Service: Orthopedics;  Laterality: Right;   LAPAROSCOPY     X2   TOTAL HIP ARTHROPLASTY Right 10/26/2017   Procedure: TOTAL HIP ARTHROPLASTY ANTERIOR APPROACH;  Surgeon: Kennedy Bucker, MD;  Location: [facility];  Service: Orthopedics;  Laterality: Right;   TOTAL HIP ARTHROPLASTY Left 03/30/2018   Procedure: LEFT TOTAL HIP ARTHROPLASTY ANTERIOR APPROACH;   Surgeon: Kathryne Hitch, MD;  Location: [facility];  Service: Orthopedics;  Laterality: Left;   tummy tuck      VAGINAL HYSTERECTOMY     lahv bso   Social History   Occupational History   Occupation: Veterinary surgeon  Tobacco Use   Smoking status: Never   Smokeless tobacco: Never  Vaping Use   Vaping status: Never Used  Substance and Sexual Activity   Alcohol use: Yes    Comment: occ   Drug use: No   Sexual activity: Yes    Birth control/protection: Surgical

## 2023-05-16 NOTE — Progress Notes (Signed)
 Pain Score---5

## 2023-05-17 ENCOUNTER — Encounter: Payer: Self-pay | Admitting: Physical Medicine and Rehabilitation

## 2023-05-24 ENCOUNTER — Other Ambulatory Visit: Payer: Self-pay | Admitting: Internal Medicine

## 2023-05-30 ENCOUNTER — Telehealth: Payer: Self-pay | Admitting: Physical Medicine and Rehabilitation

## 2023-05-30 ENCOUNTER — Ambulatory Visit: Payer: 59 | Admitting: Internal Medicine

## 2023-05-30 NOTE — Telephone Encounter (Signed)
 Pt called to reschedule her appt to 3/26 Thursday 9:30am

## 2023-06-06 ENCOUNTER — Encounter: Admitting: Physical Medicine and Rehabilitation

## 2023-06-08 ENCOUNTER — Other Ambulatory Visit: Payer: Self-pay

## 2023-06-08 ENCOUNTER — Ambulatory Visit: Admitting: Physical Medicine and Rehabilitation

## 2023-06-08 VITALS — BP 146/93 | HR 56

## 2023-06-08 DIAGNOSIS — R6882 Decreased libido: Secondary | ICD-10-CM | POA: Diagnosis not present

## 2023-06-08 DIAGNOSIS — N951 Menopausal and female climacteric states: Secondary | ICD-10-CM | POA: Diagnosis not present

## 2023-06-08 DIAGNOSIS — M5412 Radiculopathy, cervical region: Secondary | ICD-10-CM

## 2023-06-08 MED ORDER — METHYLPREDNISOLONE ACETATE 40 MG/ML IJ SUSP
40.0000 mg | Freq: Once | INTRAMUSCULAR | Status: AC
  Administered 2023-06-08: 40 mg

## 2023-06-08 NOTE — Procedures (Signed)
 Cervical Epidural Steroid Injection - Interlaminar Approach with Fluoroscopic Guidance  Patient: Paula Moses      Date of Birth: 07-11-1965 MRN: [id number] PCP: Sherlene Shams, MD      Visit Date: 06/08/2023   Universal Protocol:    Date/Time: 06/07/2509:07 AM  Consent Given By: the patient  Position: PRONE  Additional Comments: Vital signs were monitored before and after the procedure. Patient was prepped and draped in the usual sterile fashion. The correct patient, procedure, and site was verified.   Injection Procedure Details:   Procedure diagnoses: Radiculopathy, cervical region [M54.12]    Meds Administered:  Meds ordered this encounter  Medications   methylPREDNISolone acetate (DEPO-MEDROL) injection 40 mg     Laterality: Right  Location/Site: C7-T1  Needle: 3.5 in., 20 ga. Tuohy  Needle Placement: Paramedian epidural space  Findings:  -Comments: Excellent flow of contrast into the epidural space.  Procedure Details: Using a paramedian approach from the side mentioned above, the region overlying the inferior lamina was localized under fluoroscopic visualization and the soft tissues overlying this structure were infiltrated with 4 ml. of 1% Lidocaine without Epinephrine. A # 20 gauge, Tuohy needle was inserted into the epidural space using a paramedian approach.  The epidural space was localized using loss of resistance along with contralateral oblique bi-planar fluoroscopic views.  After negative aspirate for air, blood, and CSF, a 2 ml. volume of Isovue-250 was injected into the epidural space and the flow of contrast was observed. Radiographs were obtained for documentation purposes.   The injectate was administered into the level noted above.  Additional Comments:  The patient tolerated the procedure well Dressing: 2 x 2 sterile gauze and Band-Aid    Post-procedure details: Patient was observed during the procedure. Post-procedure instructions were  reviewed.  Patient left the clinic in stable condition.

## 2023-06-08 NOTE — Progress Notes (Signed)
 Pain Scale   Average Pain 6        +Driver, -BT, -Dye Allergies.

## 2023-06-08 NOTE — Progress Notes (Signed)
 Paula Moses - 58 y.o. female MRN [id number]  Date of birth: 30-Oct-1965  Office Visit Note: Visit Date: 06/08/2023 PCP: Sherlene Shams, MD Referred by: Sherlene Shams, MD  Subjective: Chief Complaint  Patient presents with   Neck - Pain   HPI:  Paula Moses is a 58 y.o. female who comes in today at the request of Ellin Goodie, FNP for planned Right C7-T1 Cervical Interlaminar epidural steroid injection with fluoroscopic guidance.  The patient has failed conservative care including home exercise, medications, time and activity modification.  This injection will be diagnostic and hopefully therapeutic.  Please see requesting physician notes for further details and justification.    ROS Otherwise per HPI.  Assessment & Plan: Visit Diagnoses:    ICD-10-CM   1. Radiculopathy, cervical region  M54.12 XR C-ARM NO REPORT    Epidural Steroid injection    methylPREDNISolone acetate (DEPO-MEDROL) injection 40 mg      Plan: No additional findings.   Meds & Orders:  Meds ordered this encounter  Medications   methylPREDNISolone acetate (DEPO-MEDROL) injection 40 mg    Orders Placed This Encounter  Procedures   XR C-ARM NO REPORT   Epidural Steroid injection    Follow-up: Return if symptoms worsen or fail to improve.   Procedures: No procedures performed  Cervical Epidural Steroid Injection - Interlaminar Approach with Fluoroscopic Guidance  Patient: Paula Moses      Date of Birth: 05-03-1965 MRN: [id number] PCP: Sherlene Shams, MD      Visit Date: 06/08/2023   Universal Protocol:    Date/Time: 06/07/2509:07 AM  Consent Given By: the patient  Position: PRONE  Additional Comments: Vital signs were monitored before and after the procedure. Patient was prepped and draped in the usual sterile fashion. The correct patient, procedure, and site was verified.   Injection Procedure Details:   Procedure diagnoses: Radiculopathy, cervical region [M54.12]    Meds  Administered:  Meds ordered this encounter  Medications   methylPREDNISolone acetate (DEPO-MEDROL) injection 40 mg     Laterality: Right  Location/Site: C7-T1  Needle: 3.5 in., 20 ga. Tuohy  Needle Placement: Paramedian epidural space  Findings:  -Comments: Excellent flow of contrast into the epidural space.  Procedure Details: Using a paramedian approach from the side mentioned above, the region overlying the inferior lamina was localized under fluoroscopic visualization and the soft tissues overlying this structure were infiltrated with 4 ml. of 1% Lidocaine without Epinephrine. A # 20 gauge, Tuohy needle was inserted into the epidural space using a paramedian approach.  The epidural space was localized using loss of resistance along with contralateral oblique bi-planar fluoroscopic views.  After negative aspirate for air, blood, and CSF, a 2 ml. volume of Isovue-250 was injected into the epidural space and the flow of contrast was observed. Radiographs were obtained for documentation purposes.   The injectate was administered into the level noted above.  Additional Comments:  The patient tolerated the procedure well Dressing: 2 x 2 sterile gauze and Band-Aid    Post-procedure details: Patient was observed during the procedure. Post-procedure instructions were reviewed.  Patient left the clinic in stable condition.   Clinical History: No specialty comments available.     Objective:  VS:  HT:    WT:   BMI:     BP:(!) 146/93  HR:(!) 56bpm  TEMP: ( )  RESP:  Physical Exam Vitals and nursing note reviewed.  Constitutional:      General: She is  not in acute distress.    Appearance: Normal appearance. She is not ill-appearing.  HENT:     Head: Normocephalic and atraumatic.     Right Ear: External ear normal.     Left Ear: External ear normal.  Eyes:     Extraocular Movements: Extraocular movements intact.  Cardiovascular:     Rate and Rhythm: Normal rate.      Pulses: Normal pulses.  Musculoskeletal:     Cervical back: Tenderness present. No rigidity.     Right lower leg: No edema.     Left lower leg: No edema.     Comments: Patient has good strength in the upper extremities including 5 out of 5 strength in wrist extension long finger flexion and APB.  There is no atrophy of the hands intrinsically.  There is a negative Hoffmann's test.   Lymphadenopathy:     Cervical: No cervical adenopathy.  Skin:    Findings: No erythema, lesion or rash.  Neurological:     General: No focal deficit present.     Mental Status: She is alert and oriented to person, place, and time.     Sensory: No sensory deficit.     Motor: No weakness or abnormal muscle tone.     Coordination: Coordination normal.  Psychiatric:        Mood and Affect: Mood normal.        Behavior: Behavior normal.      Imaging: XR C-ARM NO REPORT Result Date: 06/08/2023 Please see Notes tab for imaging impression.

## 2023-06-08 NOTE — Patient Instructions (Signed)

## 2023-06-15 DIAGNOSIS — E039 Hypothyroidism, unspecified: Secondary | ICD-10-CM | POA: Diagnosis not present

## 2023-06-15 DIAGNOSIS — Z6829 Body mass index (BMI) 29.0-29.9, adult: Secondary | ICD-10-CM | POA: Diagnosis not present

## 2023-06-15 DIAGNOSIS — N951 Menopausal and female climacteric states: Secondary | ICD-10-CM | POA: Diagnosis not present

## 2023-06-15 DIAGNOSIS — R6882 Decreased libido: Secondary | ICD-10-CM | POA: Diagnosis not present

## 2023-06-15 DIAGNOSIS — M2550 Pain in unspecified joint: Secondary | ICD-10-CM | POA: Diagnosis not present

## 2023-07-23 ENCOUNTER — Other Ambulatory Visit: Payer: Self-pay | Admitting: Internal Medicine

## 2023-08-06 ENCOUNTER — Other Ambulatory Visit: Payer: Self-pay | Admitting: Physical Medicine and Rehabilitation

## 2023-08-20 ENCOUNTER — Other Ambulatory Visit: Payer: Self-pay | Admitting: Internal Medicine

## 2023-10-09 DIAGNOSIS — N951 Menopausal and female climacteric states: Secondary | ICD-10-CM | POA: Diagnosis not present

## 2023-10-11 DIAGNOSIS — Z79890 Hormone replacement therapy (postmenopausal): Secondary | ICD-10-CM | POA: Diagnosis not present

## 2023-10-11 DIAGNOSIS — E039 Hypothyroidism, unspecified: Secondary | ICD-10-CM | POA: Diagnosis not present

## 2023-10-11 DIAGNOSIS — R5383 Other fatigue: Secondary | ICD-10-CM | POA: Diagnosis not present

## 2023-10-11 DIAGNOSIS — N951 Menopausal and female climacteric states: Secondary | ICD-10-CM | POA: Diagnosis not present

## 2023-10-11 DIAGNOSIS — Z6829 Body mass index (BMI) 29.0-29.9, adult: Secondary | ICD-10-CM | POA: Diagnosis not present

## 2023-10-12 DIAGNOSIS — J020 Streptococcal pharyngitis: Secondary | ICD-10-CM | POA: Diagnosis not present

## 2023-10-12 DIAGNOSIS — J029 Acute pharyngitis, unspecified: Secondary | ICD-10-CM | POA: Diagnosis not present

## 2023-10-24 ENCOUNTER — Other Ambulatory Visit: Payer: Self-pay | Admitting: Internal Medicine

## 2023-11-22 ENCOUNTER — Other Ambulatory Visit: Payer: Self-pay | Admitting: Internal Medicine

## 2023-12-19 ENCOUNTER — Other Ambulatory Visit: Payer: Self-pay | Admitting: Physical Medicine and Rehabilitation

## 2024-01-12 DIAGNOSIS — N951 Menopausal and female climacteric states: Secondary | ICD-10-CM | POA: Diagnosis not present

## 2024-01-15 ENCOUNTER — Encounter: Payer: Self-pay | Admitting: Radiology

## 2024-01-18 DIAGNOSIS — N958 Other specified menopausal and perimenopausal disorders: Secondary | ICD-10-CM | POA: Diagnosis not present

## 2024-01-18 DIAGNOSIS — E039 Hypothyroidism, unspecified: Secondary | ICD-10-CM | POA: Diagnosis not present

## 2024-01-18 DIAGNOSIS — L680 Hirsutism: Secondary | ICD-10-CM | POA: Diagnosis not present

## 2024-01-18 DIAGNOSIS — N951 Menopausal and female climacteric states: Secondary | ICD-10-CM | POA: Diagnosis not present

## 2024-01-18 DIAGNOSIS — F331 Major depressive disorder, recurrent, moderate: Secondary | ICD-10-CM | POA: Diagnosis not present

## 2024-01-18 DIAGNOSIS — M2550 Pain in unspecified joint: Secondary | ICD-10-CM | POA: Diagnosis not present

## 2024-01-18 DIAGNOSIS — N898 Other specified noninflammatory disorders of vagina: Secondary | ICD-10-CM | POA: Diagnosis not present

## 2024-01-18 DIAGNOSIS — Z79890 Hormone replacement therapy (postmenopausal): Secondary | ICD-10-CM | POA: Diagnosis not present

## 2024-01-18 DIAGNOSIS — R6882 Decreased libido: Secondary | ICD-10-CM | POA: Diagnosis not present

## 2024-04-14 ENCOUNTER — Other Ambulatory Visit: Payer: Self-pay | Admitting: Internal Medicine
